# Patient Record
Sex: Female | Born: 1970 | Race: White | Hispanic: No | Smoking: Never smoker
Health system: Southern US, Community
[De-identification: ages and names within clinical notes are randomized; demographics above are authoritative.]

## PROBLEM LIST (undated history)

## (undated) DIAGNOSIS — IMO0001 Reserved for inherently not codable concepts without codable children: Secondary | ICD-10-CM

## (undated) DIAGNOSIS — M1710 Unilateral primary osteoarthritis, unspecified knee: Secondary | ICD-10-CM

## (undated) DIAGNOSIS — Z9289 Personal history of other medical treatment: Secondary | ICD-10-CM

## (undated) DIAGNOSIS — I10 Essential (primary) hypertension: Secondary | ICD-10-CM

## (undated) DIAGNOSIS — G43019 Migraine without aura, intractable, without status migrainosus: Principal | ICD-10-CM

## (undated) DIAGNOSIS — M1990 Unspecified osteoarthritis, unspecified site: Secondary | ICD-10-CM

## (undated) DIAGNOSIS — E669 Obesity, unspecified: Secondary | ICD-10-CM

## (undated) DIAGNOSIS — M179 Osteoarthritis of knee, unspecified: Secondary | ICD-10-CM

## (undated) HISTORY — DX: Migraine without aura, intractable, without status migrainosus: G43.019

## (undated) HISTORY — DX: Unilateral primary osteoarthritis, unspecified knee: M17.10

## (undated) HISTORY — PX: CARDIOVASCULAR STRESS TEST: SHX262

## (undated) HISTORY — DX: Unspecified osteoarthritis, unspecified site: M19.90

## (undated) HISTORY — DX: Essential (primary) hypertension: I10

## (undated) HISTORY — DX: Osteoarthritis of knee, unspecified: M17.9

## (undated) HISTORY — PX: CARPAL TUNNEL RELEASE: SHX101

## (undated) HISTORY — PX: EXPLORATION POST OPERATIVE OPEN HEART: SHX5061

## (undated) HISTORY — DX: Obesity, unspecified: E66.9

---
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
## 1997-05-18 ENCOUNTER — Inpatient Hospital Stay (HOSPITAL_COMMUNITY): Admission: AD | Admit: 1997-05-18 | Discharge: 1997-05-18 | Payer: Self-pay | Admitting: *Deleted

## 1997-05-21 ENCOUNTER — Inpatient Hospital Stay (HOSPITAL_COMMUNITY): Admission: AD | Admit: 1997-05-21 | Discharge: 1997-05-21 | Payer: Self-pay | Admitting: *Deleted

## 1997-07-17 ENCOUNTER — Ambulatory Visit (HOSPITAL_COMMUNITY): Admission: RE | Admit: 1997-07-17 | Discharge: 1997-07-17 | Payer: Self-pay | Admitting: Family Medicine

## 1997-07-23 ENCOUNTER — Ambulatory Visit (HOSPITAL_COMMUNITY): Admission: RE | Admit: 1997-07-23 | Discharge: 1997-07-23 | Payer: Self-pay | Admitting: Family Medicine

## 1997-10-03 ENCOUNTER — Ambulatory Visit (HOSPITAL_COMMUNITY): Admission: RE | Admit: 1997-10-03 | Discharge: 1997-10-03 | Payer: Self-pay | Admitting: *Deleted

## 1997-11-22 ENCOUNTER — Inpatient Hospital Stay (HOSPITAL_COMMUNITY): Admission: AD | Admit: 1997-11-22 | Discharge: 1997-11-22 | Payer: Self-pay | Admitting: *Deleted

## 1997-11-28 ENCOUNTER — Ambulatory Visit (HOSPITAL_COMMUNITY): Admission: RE | Admit: 1997-11-28 | Discharge: 1997-11-28 | Payer: Self-pay | Admitting: *Deleted

## 1998-01-01 ENCOUNTER — Inpatient Hospital Stay (HOSPITAL_COMMUNITY): Admission: AD | Admit: 1998-01-01 | Discharge: 1998-01-01 | Payer: Self-pay | Admitting: *Deleted

## 1998-04-29 ENCOUNTER — Inpatient Hospital Stay (HOSPITAL_COMMUNITY): Admission: AD | Admit: 1998-04-29 | Discharge: 1998-04-29 | Payer: Self-pay | Admitting: Obstetrics & Gynecology

## 1998-05-01 ENCOUNTER — Inpatient Hospital Stay (HOSPITAL_COMMUNITY): Admission: AD | Admit: 1998-05-01 | Discharge: 1998-05-06 | Payer: Self-pay | Admitting: *Deleted

## 1998-05-08 ENCOUNTER — Inpatient Hospital Stay (HOSPITAL_COMMUNITY): Admission: AD | Admit: 1998-05-08 | Discharge: 1998-05-08 | Payer: Self-pay | Admitting: Obstetrics

## 1998-10-19 ENCOUNTER — Emergency Department (HOSPITAL_COMMUNITY): Admission: EM | Admit: 1998-10-19 | Discharge: 1998-10-19 | Payer: Self-pay | Admitting: Emergency Medicine

## 1998-10-19 ENCOUNTER — Encounter: Payer: Self-pay | Admitting: Emergency Medicine

## 1999-03-31 ENCOUNTER — Encounter: Admission: RE | Admit: 1999-03-31 | Discharge: 1999-06-29 | Payer: Self-pay | Admitting: Orthopedic Surgery

## 1999-11-30 ENCOUNTER — Emergency Department (HOSPITAL_COMMUNITY): Admission: EM | Admit: 1999-11-30 | Discharge: 1999-11-30 | Payer: Self-pay | Admitting: Emergency Medicine

## 1999-12-21 ENCOUNTER — Emergency Department (HOSPITAL_COMMUNITY): Admission: EM | Admit: 1999-12-21 | Discharge: 1999-12-21 | Payer: Self-pay | Admitting: Emergency Medicine

## 2000-09-15 ENCOUNTER — Encounter: Payer: Self-pay | Admitting: Emergency Medicine

## 2000-09-15 ENCOUNTER — Emergency Department (HOSPITAL_COMMUNITY): Admission: EM | Admit: 2000-09-15 | Discharge: 2000-09-15 | Payer: Self-pay | Admitting: Emergency Medicine

## 2000-10-20 ENCOUNTER — Emergency Department (HOSPITAL_COMMUNITY): Admission: EM | Admit: 2000-10-20 | Discharge: 2000-10-21 | Payer: Self-pay | Admitting: Emergency Medicine

## 2000-10-20 ENCOUNTER — Encounter: Payer: Self-pay | Admitting: Emergency Medicine

## 2001-01-04 ENCOUNTER — Encounter: Admission: RE | Admit: 2001-01-04 | Discharge: 2001-04-04 | Payer: Self-pay | Admitting: Orthopedic Surgery

## 2001-01-24 ENCOUNTER — Other Ambulatory Visit: Admission: RE | Admit: 2001-01-24 | Discharge: 2001-01-24 | Payer: Self-pay | Admitting: Family Medicine

## 2001-05-18 ENCOUNTER — Encounter: Payer: Self-pay | Admitting: Family Medicine

## 2001-05-18 ENCOUNTER — Ambulatory Visit (HOSPITAL_COMMUNITY): Admission: RE | Admit: 2001-05-18 | Discharge: 2001-05-18 | Payer: Self-pay | Admitting: Family Medicine

## 2002-04-15 ENCOUNTER — Emergency Department (HOSPITAL_COMMUNITY): Admission: EM | Admit: 2002-04-15 | Discharge: 2002-04-15 | Payer: Self-pay | Admitting: Emergency Medicine

## 2002-05-11 ENCOUNTER — Ambulatory Visit (HOSPITAL_COMMUNITY): Admission: RE | Admit: 2002-05-11 | Discharge: 2002-05-11 | Payer: Self-pay | Admitting: Obstetrics & Gynecology

## 2002-05-11 ENCOUNTER — Encounter: Payer: Self-pay | Admitting: Obstetrics & Gynecology

## 2002-07-17 ENCOUNTER — Ambulatory Visit (HOSPITAL_COMMUNITY): Admission: RE | Admit: 2002-07-17 | Discharge: 2002-07-17 | Payer: Self-pay | Admitting: Obstetrics & Gynecology

## 2002-07-17 ENCOUNTER — Encounter: Payer: Self-pay | Admitting: Obstetrics & Gynecology

## 2002-08-21 ENCOUNTER — Ambulatory Visit (HOSPITAL_COMMUNITY): Admission: AD | Admit: 2002-08-21 | Discharge: 2002-08-21 | Payer: Self-pay | Admitting: *Deleted

## 2002-08-21 ENCOUNTER — Encounter: Payer: Self-pay | Admitting: Obstetrics & Gynecology

## 2002-09-05 ENCOUNTER — Inpatient Hospital Stay (HOSPITAL_COMMUNITY): Admission: AD | Admit: 2002-09-05 | Discharge: 2002-09-05 | Payer: Self-pay | Admitting: Obstetrics & Gynecology

## 2002-09-05 ENCOUNTER — Encounter: Payer: Self-pay | Admitting: Obstetrics & Gynecology

## 2002-10-05 ENCOUNTER — Inpatient Hospital Stay (HOSPITAL_COMMUNITY): Admission: AD | Admit: 2002-10-05 | Discharge: 2002-10-05 | Payer: Self-pay | Admitting: Obstetrics & Gynecology

## 2002-11-02 ENCOUNTER — Inpatient Hospital Stay (HOSPITAL_COMMUNITY): Admission: AD | Admit: 2002-11-02 | Discharge: 2002-11-04 | Payer: Self-pay | Admitting: Obstetrics

## 2002-11-02 ENCOUNTER — Encounter: Payer: Self-pay | Admitting: Obstetrics

## 2002-11-23 ENCOUNTER — Inpatient Hospital Stay (HOSPITAL_COMMUNITY): Admission: AD | Admit: 2002-11-23 | Discharge: 2002-11-24 | Payer: Self-pay | Admitting: Obstetrics

## 2002-11-24 ENCOUNTER — Inpatient Hospital Stay (HOSPITAL_COMMUNITY): Admission: AD | Admit: 2002-11-24 | Discharge: 2002-11-24 | Payer: Self-pay | Admitting: Obstetrics & Gynecology

## 2002-12-06 ENCOUNTER — Inpatient Hospital Stay (HOSPITAL_COMMUNITY): Admission: AD | Admit: 2002-12-06 | Discharge: 2002-12-07 | Payer: Self-pay | Admitting: Obstetrics & Gynecology

## 2002-12-11 ENCOUNTER — Inpatient Hospital Stay (HOSPITAL_COMMUNITY): Admission: AD | Admit: 2002-12-11 | Discharge: 2002-12-13 | Payer: Self-pay | Admitting: Obstetrics & Gynecology

## 2003-01-31 ENCOUNTER — Encounter (INDEPENDENT_AMBULATORY_CARE_PROVIDER_SITE_OTHER): Payer: Self-pay | Admitting: *Deleted

## 2003-01-31 ENCOUNTER — Ambulatory Visit (HOSPITAL_COMMUNITY): Admission: RE | Admit: 2003-01-31 | Discharge: 2003-01-31 | Payer: Self-pay | Admitting: General Surgery

## 2003-02-25 ENCOUNTER — Emergency Department (HOSPITAL_COMMUNITY): Admission: EM | Admit: 2003-02-25 | Discharge: 2003-02-25 | Payer: Self-pay | Admitting: Emergency Medicine

## 2003-03-28 ENCOUNTER — Emergency Department (HOSPITAL_COMMUNITY): Admission: EM | Admit: 2003-03-28 | Discharge: 2003-03-28 | Payer: Self-pay | Admitting: Family Medicine

## 2003-11-12 ENCOUNTER — Emergency Department (HOSPITAL_COMMUNITY): Admission: EM | Admit: 2003-11-12 | Discharge: 2003-11-12 | Payer: Self-pay | Admitting: Emergency Medicine

## 2004-01-10 ENCOUNTER — Emergency Department (HOSPITAL_COMMUNITY): Admission: EM | Admit: 2004-01-10 | Discharge: 2004-01-10 | Payer: Self-pay | Admitting: Family Medicine

## 2004-02-15 ENCOUNTER — Emergency Department (HOSPITAL_COMMUNITY): Admission: EM | Admit: 2004-02-15 | Discharge: 2004-02-15 | Payer: Self-pay | Admitting: Family Medicine

## 2004-03-13 ENCOUNTER — Inpatient Hospital Stay (HOSPITAL_COMMUNITY): Admission: AD | Admit: 2004-03-13 | Discharge: 2004-03-13 | Payer: Self-pay | Admitting: Obstetrics

## 2004-07-22 ENCOUNTER — Emergency Department (HOSPITAL_COMMUNITY): Admission: EM | Admit: 2004-07-22 | Discharge: 2004-07-22 | Payer: Self-pay | Admitting: Family Medicine

## 2004-08-02 ENCOUNTER — Emergency Department (HOSPITAL_COMMUNITY): Admission: EM | Admit: 2004-08-02 | Discharge: 2004-08-02 | Payer: Self-pay | Admitting: Family Medicine

## 2004-09-29 ENCOUNTER — Emergency Department (HOSPITAL_COMMUNITY): Admission: EM | Admit: 2004-09-29 | Discharge: 2004-09-29 | Payer: Self-pay | Admitting: Family Medicine

## 2004-10-31 ENCOUNTER — Emergency Department (HOSPITAL_COMMUNITY): Admission: EM | Admit: 2004-10-31 | Discharge: 2004-10-31 | Payer: Self-pay | Admitting: Family Medicine

## 2004-12-12 ENCOUNTER — Emergency Department (HOSPITAL_COMMUNITY): Admission: EM | Admit: 2004-12-12 | Discharge: 2004-12-12 | Payer: Self-pay | Admitting: Family Medicine

## 2005-01-08 ENCOUNTER — Emergency Department (HOSPITAL_COMMUNITY): Admission: EM | Admit: 2005-01-08 | Discharge: 2005-01-08 | Payer: Self-pay | Admitting: Family Medicine

## 2005-02-06 ENCOUNTER — Emergency Department (HOSPITAL_COMMUNITY): Admission: EM | Admit: 2005-02-06 | Discharge: 2005-02-06 | Payer: Self-pay | Admitting: Emergency Medicine

## 2005-02-17 ENCOUNTER — Emergency Department (HOSPITAL_COMMUNITY): Admission: EM | Admit: 2005-02-17 | Discharge: 2005-02-17 | Payer: Self-pay | Admitting: Family Medicine

## 2005-05-11 ENCOUNTER — Emergency Department (HOSPITAL_COMMUNITY): Admission: EM | Admit: 2005-05-11 | Discharge: 2005-05-11 | Payer: Self-pay | Admitting: Emergency Medicine

## 2005-08-29 ENCOUNTER — Emergency Department (HOSPITAL_COMMUNITY): Admission: EM | Admit: 2005-08-29 | Discharge: 2005-08-30 | Payer: Self-pay | Admitting: Emergency Medicine

## 2005-09-09 ENCOUNTER — Emergency Department (HOSPITAL_COMMUNITY): Admission: EM | Admit: 2005-09-09 | Discharge: 2005-09-09 | Payer: Self-pay | Admitting: Family Medicine

## 2005-10-07 ENCOUNTER — Emergency Department (HOSPITAL_COMMUNITY): Admission: EM | Admit: 2005-10-07 | Discharge: 2005-10-07 | Payer: Self-pay | Admitting: Emergency Medicine

## 2006-02-13 ENCOUNTER — Emergency Department (HOSPITAL_COMMUNITY): Admission: EM | Admit: 2006-02-13 | Discharge: 2006-02-13 | Payer: Self-pay | Admitting: Emergency Medicine

## 2006-03-22 ENCOUNTER — Emergency Department (HOSPITAL_COMMUNITY): Admission: EM | Admit: 2006-03-22 | Discharge: 2006-03-22 | Payer: Self-pay | Admitting: Family Medicine

## 2006-03-25 ENCOUNTER — Emergency Department (HOSPITAL_COMMUNITY): Admission: EM | Admit: 2006-03-25 | Discharge: 2006-03-25 | Payer: Self-pay | Admitting: Family Medicine

## 2006-05-05 ENCOUNTER — Emergency Department (HOSPITAL_COMMUNITY): Admission: EM | Admit: 2006-05-05 | Discharge: 2006-05-05 | Payer: Self-pay | Admitting: Emergency Medicine

## 2006-09-24 ENCOUNTER — Ambulatory Visit (HOSPITAL_BASED_OUTPATIENT_CLINIC_OR_DEPARTMENT_OTHER): Admission: RE | Admit: 2006-09-24 | Discharge: 2006-09-24 | Payer: Self-pay | Admitting: Orthopedic Surgery

## 2006-11-10 ENCOUNTER — Emergency Department (HOSPITAL_COMMUNITY): Admission: EM | Admit: 2006-11-10 | Discharge: 2006-11-10 | Payer: Self-pay | Admitting: Family Medicine

## 2007-04-23 ENCOUNTER — Emergency Department (HOSPITAL_COMMUNITY): Admission: EM | Admit: 2007-04-23 | Discharge: 2007-04-23 | Payer: Self-pay | Admitting: Family Medicine

## 2007-06-18 ENCOUNTER — Emergency Department (HOSPITAL_COMMUNITY): Admission: EM | Admit: 2007-06-18 | Discharge: 2007-06-18 | Payer: Self-pay | Admitting: Emergency Medicine

## 2007-08-11 ENCOUNTER — Emergency Department (HOSPITAL_COMMUNITY): Admission: EM | Admit: 2007-08-11 | Discharge: 2007-08-11 | Payer: Self-pay | Admitting: Family Medicine

## 2007-08-28 ENCOUNTER — Emergency Department (HOSPITAL_COMMUNITY): Admission: EM | Admit: 2007-08-28 | Discharge: 2007-08-28 | Payer: Self-pay | Admitting: Family Medicine

## 2007-10-26 ENCOUNTER — Emergency Department (HOSPITAL_COMMUNITY): Admission: EM | Admit: 2007-10-26 | Discharge: 2007-10-26 | Payer: Self-pay | Admitting: Family Medicine

## 2007-12-03 ENCOUNTER — Emergency Department (HOSPITAL_COMMUNITY): Admission: EM | Admit: 2007-12-03 | Discharge: 2007-12-03 | Payer: Self-pay | Admitting: Family Medicine

## 2008-02-09 ENCOUNTER — Emergency Department (HOSPITAL_COMMUNITY): Admission: EM | Admit: 2008-02-09 | Discharge: 2008-02-09 | Payer: Self-pay | Admitting: Family Medicine

## 2008-05-23 ENCOUNTER — Emergency Department (HOSPITAL_COMMUNITY): Admission: EM | Admit: 2008-05-23 | Discharge: 2008-05-23 | Payer: Self-pay | Admitting: Emergency Medicine

## 2008-07-13 ENCOUNTER — Emergency Department (HOSPITAL_COMMUNITY): Admission: EM | Admit: 2008-07-13 | Discharge: 2008-07-13 | Payer: Self-pay | Admitting: Emergency Medicine

## 2008-11-01 ENCOUNTER — Emergency Department (HOSPITAL_COMMUNITY): Admission: EM | Admit: 2008-11-01 | Discharge: 2008-11-01 | Payer: Self-pay | Admitting: Family Medicine

## 2008-12-06 ENCOUNTER — Emergency Department (HOSPITAL_COMMUNITY): Admission: EM | Admit: 2008-12-06 | Discharge: 2008-12-06 | Payer: Self-pay | Admitting: Family Medicine

## 2009-03-17 ENCOUNTER — Emergency Department (HOSPITAL_COMMUNITY): Admission: EM | Admit: 2009-03-17 | Discharge: 2009-03-17 | Payer: Self-pay | Admitting: Family Medicine

## 2009-05-03 ENCOUNTER — Emergency Department (HOSPITAL_COMMUNITY): Admission: EM | Admit: 2009-05-03 | Discharge: 2009-05-04 | Payer: Self-pay | Admitting: Emergency Medicine

## 2009-05-13 ENCOUNTER — Ambulatory Visit: Payer: Self-pay | Admitting: Family Medicine

## 2009-06-11 ENCOUNTER — Emergency Department (HOSPITAL_COMMUNITY): Admission: EM | Admit: 2009-06-11 | Discharge: 2009-06-11 | Payer: Self-pay | Admitting: Family Medicine

## 2009-07-30 ENCOUNTER — Ambulatory Visit: Payer: Self-pay | Admitting: Internal Medicine

## 2009-08-22 ENCOUNTER — Other Ambulatory Visit: Admission: RE | Admit: 2009-08-22 | Discharge: 2009-08-22 | Payer: Self-pay | Admitting: Obstetrics & Gynecology

## 2009-08-22 ENCOUNTER — Encounter (INDEPENDENT_AMBULATORY_CARE_PROVIDER_SITE_OTHER): Payer: Self-pay | Admitting: *Deleted

## 2009-08-22 ENCOUNTER — Ambulatory Visit: Payer: Self-pay | Admitting: Obstetrics and Gynecology

## 2009-08-22 LAB — CONVERTED CEMR LAB: Prolactin: 8.8 ng/mL

## 2009-08-23 ENCOUNTER — Encounter: Payer: Self-pay | Admitting: Obstetrics and Gynecology

## 2009-08-23 LAB — CONVERTED CEMR LAB: Trich, Wet Prep: NONE SEEN

## 2009-09-05 ENCOUNTER — Ambulatory Visit: Payer: Self-pay | Admitting: Obstetrics and Gynecology

## 2009-09-24 ENCOUNTER — Ambulatory Visit: Payer: Self-pay | Admitting: Internal Medicine

## 2009-09-24 ENCOUNTER — Encounter (INDEPENDENT_AMBULATORY_CARE_PROVIDER_SITE_OTHER): Payer: Self-pay | Admitting: Family Medicine

## 2009-09-24 LAB — CONVERTED CEMR LAB
ALT: 16 units/L (ref 0–35)
Albumin: 4.5 g/dL (ref 3.5–5.2)
Calcium: 9.9 mg/dL (ref 8.4–10.5)
Chloride: 105 meq/L (ref 96–112)
Potassium: 4.2 meq/L (ref 3.5–5.3)
Total Bilirubin: 0.4 mg/dL (ref 0.3–1.2)
Total CHOL/HDL Ratio: 3.5
Triglycerides: 491 mg/dL — ABNORMAL HIGH (ref <150)

## 2009-09-30 ENCOUNTER — Ambulatory Visit: Payer: Self-pay | Admitting: Internal Medicine

## 2010-01-16 ENCOUNTER — Observation Stay (HOSPITAL_COMMUNITY): Admission: EM | Admit: 2010-01-16 | Discharge: 2009-09-30 | Payer: Self-pay | Admitting: Emergency Medicine

## 2010-02-09 DIAGNOSIS — IMO0001 Reserved for inherently not codable concepts without codable children: Secondary | ICD-10-CM

## 2010-02-09 DIAGNOSIS — Z9289 Personal history of other medical treatment: Secondary | ICD-10-CM

## 2010-02-09 HISTORY — DX: Personal history of other medical treatment: Z92.89

## 2010-02-09 HISTORY — PX: KNEE SURGERY: SHX244

## 2010-02-09 HISTORY — DX: Reserved for inherently not codable concepts without codable children: IMO0001

## 2010-03-03 ENCOUNTER — Ambulatory Visit: Admit: 2010-03-03 | Payer: Self-pay | Admitting: Obstetrics and Gynecology

## 2010-04-25 LAB — DIFFERENTIAL
Lymphocytes Relative: 37 % (ref 12–46)
Lymphs Abs: 2.9 10*3/uL (ref 0.7–4.0)

## 2010-04-25 LAB — POCT CARDIAC MARKERS
CKMB, poc: <1 ng/mL — ABNORMAL LOW (ref 1.0–8.0)
CKMB, poc: <1 ng/mL — ABNORMAL LOW (ref 1.0–8.0)
Myoglobin, poc: 37.9 ng/mL (ref 12–200)
Myoglobin, poc: 40.1 ng/mL (ref 12–200)
Myoglobin, poc: 44.7 ng/mL (ref 12–200)
Troponin i, poc: <0.05 ng/mL (ref 0.00–0.09)
Troponin i, poc: <0.05 ng/mL (ref 0.00–0.09)
Troponin i, poc: <0.05 ng/mL (ref 0.00–0.09)

## 2010-04-25 LAB — URINALYSIS, ROUTINE W REFLEX MICROSCOPIC
Glucose, UA: NEGATIVE mg/dL
Hgb urine dipstick: NEGATIVE
Nitrite: NEGATIVE
Protein, ur: 100 mg/dL — AB
Specific Gravity, Urine: 1.029 (ref 1.005–1.030)
Urobilinogen, UA: 0.2 mg/dL (ref 0.0–1.0)
pH: 6 (ref 5.0–8.0)

## 2010-04-25 LAB — URINE CULTURE: Colony Count: >=100000

## 2010-04-25 LAB — CK TOTAL AND CKMB (NOT AT ARMC)
CK, MB: 1.2 ng/mL (ref 0.3–4.0)
CK, MB: 1.4 ng/mL (ref 0.3–4.0)
Relative Index: INVALID (ref 0.0–2.5)
Total CK: 82 U/L (ref 7–177)

## 2010-04-25 LAB — CBC
MCH: 27.3 pg (ref 26.0–34.0)
Platelets: 200 10*3/uL (ref 150–400)
RDW: 13.8 % (ref 11.5–15.5)

## 2010-04-25 LAB — BASIC METABOLIC PANEL
BUN: 12 mg/dL (ref 6–23)
CO2: 22 mEq/L (ref 19–32)
GFR calc Af Amer: >60 mL/min (ref >60)
GFR calc non Af Amer: >60 mL/min (ref >60)
Potassium: 3.5 mEq/L (ref 3.5–5.1)
Sodium: 136 mEq/L (ref 135–145)

## 2010-04-25 LAB — URINE MICROSCOPIC-ADD ON

## 2010-04-25 LAB — TROPONIN I
Troponin I: 0.01 ng/mL (ref 0.00–0.06)
Troponin I: 0.01 ng/mL (ref 0.00–0.06)

## 2010-04-25 LAB — PROTIME-INR: Prothrombin Time: 12.5 seconds (ref 11.6–15.2)

## 2010-04-27 LAB — POCT PREGNANCY, URINE: Preg Test, Ur: NEGATIVE

## 2010-05-19 LAB — RAPID STREP SCREEN (MED CTR MEBANE ONLY): Streptococcus, Group A Screen (Direct): POSITIVE — AB

## 2010-05-21 LAB — CBC
HCT: 33.7 % — ABNORMAL LOW (ref 36.0–46.0)
Hemoglobin: 11.6 g/dL — ABNORMAL LOW (ref 12.0–15.0)
MCHC: 34.6 g/dL (ref 30.0–36.0)
MCV: 79.5 fL (ref 78.0–100.0)
RBC: 4.23 MIL/uL (ref 3.87–5.11)
RDW: 14.1 % (ref 11.5–15.5)

## 2010-05-21 LAB — COMPREHENSIVE METABOLIC PANEL
ALT: 15 U/L (ref 0–35)
BUN: 11 mg/dL (ref 6–23)
CO2: 21 mEq/L (ref 19–32)
Calcium: 9.1 mg/dL (ref 8.4–10.5)
GFR calc non Af Amer: >60 mL/min (ref >60)
Glucose, Bld: 138 mg/dL — ABNORMAL HIGH (ref 70–99)
Sodium: 138 mEq/L (ref 135–145)

## 2010-05-21 LAB — DIFFERENTIAL
Basophils Absolute: 0 10*3/uL (ref 0.0–0.1)
Eosinophils Relative: 1 % (ref 0–5)
Lymphs Abs: 1.9 10*3/uL (ref 0.7–4.0)
Monocytes Absolute: 0.4 10*3/uL (ref 0.1–1.0)
Monocytes Relative: 6 % (ref 3–12)
Neutro Abs: 4.1 10*3/uL (ref 1.7–7.7)
Neutrophils Relative %: 64 % (ref 43–77)

## 2010-05-21 LAB — POCT CARDIAC MARKERS
CKMB, poc: <1 ng/mL — ABNORMAL LOW (ref 1.0–8.0)
Troponin i, poc: <0.05 ng/mL (ref 0.00–0.09)

## 2010-06-24 NOTE — Op Note (Signed)
Pamela [name], Pamela [name]              ACCOUNT NO.:  [id number]   MEDICAL RECORD NO.:  [id number]          PATIENT TYPE:  AMB   LOCATION:  DSC                          FACILITY:  [facility]   PHYSICIAN:  Harvie Junior, M.D.   DATE OF BIRTH:  [birth year]   DATE OF PROCEDURE:  09/24/2006  DATE OF DISCHARGE:                               OPERATIVE REPORT   PREOPERATIVE DIAGNOSIS:  Lateral meniscal tear with lateral femoral  condylar defect.   POSTOPERATIVE DIAGNOSES:  1. Lateral meniscal tear with lateral femoral condylar defect.  2. Tight lateral retinaculum.   PRINCIPAL PROCEDURE:  1. Partial lateral meniscectomy with corresponding debridement in the      lateral femoral compartment.  2. Lateral retinacular release.   SURGEON:  Harvie Junior, M.D.   ASSISTANT:  Marshia Ly, P.A.   ANESTHESIA:  General.   BRIEF HISTORY:  Pamela [name] is a 40 year old female with a history of  having had some significant lateral compartment arthritis.  She has been  dealing with this for a long period of time but ultimately came in with  significant lateral joint line pain and difficulty dealing with that.  She was also having some patellofemoral type pain but predominantly  lateral joint line pain.  We talked about treatment options.  We  ultimately felt that arthroscopic intervention was going to be the most  appropriate course of action so she was brought to the operating room  for this procedure.   PROCEDURE:  The patient is brought to the operating room.  After  adequate anesthesia was obtained with general anesthetic, the patient  was placed up on the operating room table.  The right leg was prepped  and draped in the usual sterile fashion.  Following this, routine  arthroscopic examination of the knee revealed that the medial femoral  condyle was within normal limits, no significant chondral injury.  The  medial meniscus was within normal limits.  Attention turned to the ACL,  normal.   Attention was turned to the lateral side which, unfortunately,  had basically a 25 mm area of exposed bone on the lateral tibial plateau  and an even bigger area on the lateral femoral condyle.  We cleaned up  the edges of this lesion which hopefully is where her pain was coming  from and there was some loose and fragmenting cartilage on those areas.  There was multiple floating cartilage in the knee and we thoroughly  debrided the knee. We went up and looked at the knee cap.  It was  tracking lateral and there was tightness in the lateral retinaculum.  We  did a lateral retinacular release and our thought being that the only  hope we really had to help her was taking some pressure off of that  lateral patellar facet and so a lateral retinacular release was  performed from 3 fingerbreadths proximal to the patella down to the  joint line.  Once that was completed, attention was turned towards the  patellar tracking, which was certainly now midline and certainly the  cannula could be moved under the patella much  easier at this point.  Once this was completed, the final check was made in the lateral  compartment and  there was no loose or fragmented pieces of cartilage.  Thorough and  copious irrigation was undertaken at this point and a sterile  compressive dressing was applied and the patient was taken to the  recovery room, she was noted to be satisfactory condition.  Estimated  blood loss of this procedure was none.      Harvie Junior, M.D.  Electronically Signed     JLG/MEDQ  D:  09/24/2006  T:  09/24/2006  Job:  [id number]

## 2010-06-27 NOTE — Discharge Summary (Signed)
NAME:  Pamela [name], Pamela [name]                   ACCOUNT NO.:  [id number]   MEDICAL RECORD NO.:  [id number]                   PATIENT TYPE:  INP   LOCATION:  9115                                 FACILITY:  [facility]   PHYSICIAN:  Charles A. Clearance Coots, M.D.             DATE OF BIRTH:  1970-12-12   DATE OF ADMISSION:  12/06/2002  DATE OF DISCHARGE:  12/07/2002                                 DISCHARGE SUMMARY   ADMISSION DIAGNOSES:  1. Thirty-eight weeks gestation.  2. Severe headache.  3. Elevated blood pressures.   DISCHARGE DIAGNOSES:  1. Thirty-eight weeks gestation.  2. Headache and elevated blood pressures resolved.  The patient feeling much     improved with no headache and no evidence of preeclampsia.  3. Discharged home undelivered at [redacted] weeks gestation in good condition.   REASON FOR ADMISSION:  A 40 year old white female G 5, P 3 presented to the  office with severe headache not relieved with Tylenol or Fioricet. On  examination in the office, blood pressure was elevated.  The patient also  complained of blurred vision.  Therefore, sent to [facility] for  admission and rule out preeclampsia.   PAST MEDICAL HISTORY:   SURGERIES:  1. Heart surgery at age 40.  2. Cesarean section.  3. Two vaginal deliveries.   ILLNESSES:  1. Irritable bowel syndrome.  2. Constipation prone.  3. Migraines.  4. Obesity.   MEDICATIONS:  Prenatal vitamins, Zyrtec, Tylenol, Fioricet.   ALLERGIES:  No known drug allergies.   FAMILY HISTORY:  Significant for heart disease, hypertension, diabetes,  stroke.   SOCIAL HISTORY:  Denies tobacco, alcohol, or recreational drug use.   PHYSICAL EXAMINATION:  GENERAL:  Obese white female in no acute distress.  VITAL SIGNS:  Height 5 feet 3 inches, weight 222 pounds.  She is afebrile.  Blood pressure 160/100 and repeat blood pressure 168/97.  HEENT:  Normal.  LUNGS:  Clear to auscultation bilaterally.  HEART:  Regular rate and rhythm.  ABDOMEN:   Soft, gravid, nontender.  FETAL MONITORING:  External fetal monitoring revealed an irregular uterine  contractions, reactive tracing.   LABORATORY DATA:  Hemoglobin 10.4, hematocrit 30, white blood cell count  7000, platelets 184,000.  Urinalysis was significant for negative protein.  Comprehensive metabolic panel was significant for creatinine of 0.6, BUN of  9, uric acid of 4.5, SGOT of 16, SGPT of less than 19, LDH of 114.   HOSPITAL COURSE:  The patient was admitted and started on IV magnesium  sulfate.  She responded quite well to bed rest.  Labs indicated no evidence  of preeclampsia.  With continued IV hydration and bed rest headaches  resolved.  The patient did well overnight and on the following morning had  no headache.  Nonstress was reactive.  The patient was therefore discharged  home undelivered at [redacted] weeks gestation.  Headaches resolved.  No evidence of  preeclampsia.  Discharged home in good condition.  DISCHARGE DISPOSITION:  Medications:  Continue on prenatal vitamins, Tylenol  if needed.  Routine written instructions were given for obstetrical  discharge.  The patient is to follow up at the office in one week for  prenatal care.  Preeclampsia precautions were also given.                                               Charles A. Clearance Coots, M.D.    CAH/MEDQ  D:  12/07/2002  T:  12/07/2002  Job:  [id number]

## 2010-06-27 NOTE — Op Note (Signed)
NAME:  Pamela [name], Pamela [name]                   ACCOUNT NO.:  [id number]   MEDICAL RECORD NO.:  [id number]                   PATIENT TYPE:  AMB   LOCATION:  DAY                                  FACILITY:  [facility]   PHYSICIAN:  Leonie Man, M.D.                DATE OF BIRTH:  [birth year]   DATE OF PROCEDURE:  01/31/2003  DATE OF DISCHARGE:                                 OPERATIVE REPORT   PREOPERATIVE DIAGNOSIS:  Hemorrhoidal disease with internal and external  hemorrhoids.   POSTOPERATIVE DIAGNOSIS:  Hemorrhoidal disease with internal and external  hemorrhoids.   OPERATION/PROCEDURE:  Total hemorrhoidectomy by PPH.   SURGEON:  Leonie Man, M.D.   ASSISTANT:  Nurse.   ANESTHESIA:  General.  The patient was in jackknife position.   INDICATIONS:  The patient is a 40 year old lady with severe hemorrhoidal  disease and partial rectal mucosal prolapse who comes now to the operating  room for total hemorrhoidectomy.  She understands the risks and potential  benefits of surgery and she gives consent for same.   DESCRIPTION OF PROCEDURE:  Following the induction of satisfactory general  anesthesia, the patient is positioned in the jackknife position.  The  buttocks cheeks are spread and held with tapes and the perianal tissues are  prepped and draped to be included in the sterile operative field.  Perianal  injections with 0.25% Marcaine with epinephrine and Wydase are injected into  the perianal tissues.  Gradual dilatation of the anal sphincter was then  carried out, up to three fingerbreadths.  The operating anoscope was then  placed into the anus.  I placed a pursestring suture approximately 4.5-5 cm  above  the dentate line using a 2-0 Prolene suture.  After having placed the  suture, the vagina was checked and there was no inclusion of the  rectovaginal septum.  The pursestring suture was noted to be fully intact.  I then placed the PPH stapling device with the anvil of the  suture line and  gently closed the instrument down to the anal verge which was then 4 cm.  The stapler was then closed and held tightly for approximately 30 seconds in  order to provide adequate tamponade for hemostasis.  The stapler was then  fired and held for another 30 seconds in order to provide tamponade.  The  stapling device was then removed.  A circumferential area of anorectal  mucosa along with the portions of the hemorrhoidal plexus were removed and  this was forwarded for pathologic evaluation.  The suture line was then  thoroughly checked for hemostasis.  It was noted to be dry.  A Gelfoam roll  was placed up into the anus at the suture line for additional tamponade and  hemostasis.  Sponge, instruments and sharp counts were verified and the anus  was covered with gauze dressing.  The patient then removed from the  operating room to the recovery room in stable condition.  She tolerated the  procedure well.                                               Leonie Man, M.D.    PB/MEDQ  D:  01/31/2003  T:  01/31/2003  Job:  [id number]   cc:   Roseanna Rainbow, M.D.  [address]  [place]  [place] [address]  Fax: [phone]

## 2010-06-28 ENCOUNTER — Observation Stay (HOSPITAL_COMMUNITY)
Admission: EM | Admit: 2010-06-28 | Discharge: 2010-06-30 | Disposition: A | Discharge status: Home / Self Care | Payer: Medicaid Other | Attending: Internal Medicine | Admitting: Internal Medicine

## 2010-06-28 ENCOUNTER — Emergency Department (HOSPITAL_COMMUNITY): Payer: Medicaid Other

## 2010-06-28 ENCOUNTER — Inpatient Hospital Stay (INDEPENDENT_AMBULATORY_CARE_PROVIDER_SITE_OTHER)
Admission: RE | Admit: 2010-06-28 | Discharge: 2010-06-28 | Disposition: A | Discharge status: Home / Self Care | Payer: Self-pay | Source: Ambulatory Visit | Attending: Family Medicine | Admitting: Family Medicine

## 2010-06-28 DIAGNOSIS — R0602 Shortness of breath: Secondary | ICD-10-CM | POA: Insufficient documentation

## 2010-06-28 DIAGNOSIS — K219 Gastro-esophageal reflux disease without esophagitis: Secondary | ICD-10-CM | POA: Insufficient documentation

## 2010-06-28 DIAGNOSIS — R079 Chest pain, unspecified: Secondary | ICD-10-CM

## 2010-06-28 DIAGNOSIS — Z8249 Family history of ischemic heart disease and other diseases of the circulatory system: Secondary | ICD-10-CM | POA: Insufficient documentation

## 2010-06-28 DIAGNOSIS — R0789 Other chest pain: Principal | ICD-10-CM | POA: Insufficient documentation

## 2010-06-28 DIAGNOSIS — R42 Dizziness and giddiness: Secondary | ICD-10-CM

## 2010-06-28 DIAGNOSIS — R51 Headache: Secondary | ICD-10-CM

## 2010-06-28 LAB — BASIC METABOLIC PANEL
BUN: 15 mg/dL (ref 6–23)
Creatinine, Ser: 0.55 mg/dL (ref 0.4–1.2)
GFR calc non Af Amer: >60 mL/min (ref >60)
Glucose, Bld: 84 mg/dL (ref 70–99)

## 2010-06-28 LAB — DIFFERENTIAL
Basophils Absolute: 0 10*3/uL (ref 0.0–0.1)
Basophils Relative: 0 % (ref 0–1)
Eosinophils Absolute: 0.1 10*3/uL (ref 0.0–0.7)
Eosinophils Relative: 1 % (ref 0–5)
Lymphs Abs: 2.7 10*3/uL (ref 0.7–4.0)
Neutrophils Relative %: 56 % (ref 43–77)

## 2010-06-28 LAB — POCT CARDIAC MARKERS
CKMB, poc: <1 ng/mL — ABNORMAL LOW (ref 1.0–8.0)
Myoglobin, poc: 71.2 ng/mL (ref 12–200)
Troponin i, poc: <0.05 ng/mL (ref 0.00–0.09)

## 2010-06-28 LAB — CBC
Platelets: 187 10*3/uL (ref 150–400)
RBC: 4.46 MIL/uL (ref 3.87–5.11)
RDW: 13.7 % (ref 11.5–15.5)
WBC: 7.4 10*3/uL (ref 4.0–10.5)

## 2010-06-28 LAB — PREGNANCY, URINE: Preg Test, Ur: NEGATIVE

## 2010-06-29 DIAGNOSIS — R079 Chest pain, unspecified: Secondary | ICD-10-CM

## 2010-06-29 LAB — PRO B NATRIURETIC PEPTIDE: Pro B Natriuretic peptide (BNP): 27.5 pg/mL (ref 0–125)

## 2010-06-29 LAB — CBC
HCT: 34.6 % — ABNORMAL LOW (ref 36.0–46.0)
MCH: 26.7 pg (ref 26.0–34.0)
MCV: 81.6 fL (ref 78.0–100.0)
Platelets: 188 10*3/uL (ref 150–400)
RDW: 13.9 % (ref 11.5–15.5)

## 2010-06-29 LAB — CARDIAC PANEL(CRET KIN+CKTOT+MB+TROPI)
Relative Index: 1.2 (ref 0.0–2.5)
Relative Index: 1.2 (ref 0.0–2.5)
Troponin I: <0.3 ng/mL (ref <0.30)

## 2010-06-29 LAB — LIPID PANEL
HDL: 46 mg/dL (ref >39)
LDL Cholesterol: 87 mg/dL (ref 0–99)
Total CHOL/HDL Ratio: 3.2 RATIO
Triglycerides: 74 mg/dL (ref <150)

## 2010-06-29 LAB — BASIC METABOLIC PANEL
BUN: 13 mg/dL (ref 6–23)
Chloride: 107 mEq/L (ref 96–112)
Glucose, Bld: 140 mg/dL — ABNORMAL HIGH (ref 70–99)
Potassium: 4.4 mEq/L (ref 3.5–5.1)
Sodium: 140 mEq/L (ref 135–145)

## 2010-06-29 LAB — PHOSPHORUS: Phosphorus: 3.1 mg/dL (ref 2.3–4.6)

## 2010-06-29 LAB — RAPID URINE DRUG SCREEN, HOSP PERFORMED
Barbiturates: NOT DETECTED
Benzodiazepines: NOT DETECTED
Cocaine: NOT DETECTED

## 2010-06-29 LAB — MRSA PCR SCREENING: MRSA by PCR: NEGATIVE

## 2010-06-29 LAB — CK TOTAL AND CKMB (NOT AT ARMC): Relative Index: 1.2 (ref 0.0–2.5)

## 2010-06-30 ENCOUNTER — Inpatient Hospital Stay (HOSPITAL_COMMUNITY): Payer: Medicaid Other

## 2010-06-30 DIAGNOSIS — R072 Precordial pain: Secondary | ICD-10-CM

## 2010-06-30 LAB — D-DIMER, QUANTITATIVE: D-Dimer, Quant: 0.32 ug/mL-FEU (ref 0.00–0.48)

## 2010-06-30 MED ORDER — TECHNETIUM TC 99M TETROFOSMIN IV KIT
30.0000 | PACK | Freq: Once | INTRAVENOUS | Status: AC | PRN
  Administered 2010-06-30: 30 via INTRAVENOUS

## 2010-06-30 MED ORDER — TECHNETIUM TC 99M TETROFOSMIN IV KIT
10.0000 | PACK | Freq: Once | INTRAVENOUS | Status: AC | PRN
  Administered 2010-06-30: 10 via INTRAVENOUS

## 2010-07-01 ENCOUNTER — Other Ambulatory Visit (HOSPITAL_COMMUNITY): Payer: Self-pay

## 2010-07-03 ENCOUNTER — Telehealth: Payer: Self-pay | Admitting: Internal Medicine

## 2010-07-03 NOTE — Telephone Encounter (Signed)
Pt returning your call. Pt states someone called her re heart monitor.

## 2010-07-04 NOTE — Consult Note (Signed)
NAMEGIANELLE, [name] NO.:  [id number]  MEDICAL RECORD NO.:  [id number]           PATIENT TYPE:  I  LOCATION:  3701                         FACILITY:  [facility]  PHYSICIAN:  Hillis Range, MD       DATE OF BIRTH:  [birth year]  DATE OF CONSULTATION: DATE OF DISCHARGE:                                CONSULTATION   REQUESTING PHYSICIAN:  Brendia Sacks, MD  REASON FOR CONSULTATION:  Chest pain.  HISTORY OF PRESENT ILLNESS:  [name] is a pleasant 40 year old female with no prior cardiac history who presents for further evaluation and management of chest pain.  The patient reports that she went to Urgent Care for evaluation of a headache.  While there, she developed sharp, fleeting left-sided chest pain, which she describes is 6/10 with radiation into her left arm.  She reports that this pain occurred intermittently since that time.  She reports having a "dull ache" sensation along her left chest as well.  She states that she has had this pain intermittently for "a long time."  She finds that episodes typically will occur when stressed or at rest.  She denies any episodes of chest pain with exertion.  Recently, she has tried to lose weight and has been walking on a treadmill frequently without any ischemic symptoms.  She denies associated palpitations, shortness of breath, nausea, vomiting, dizziness, presyncope, or syncope.  She was brought to [facility] for further evaluation.  Presently, she is resting comfortably and is without complaint.  She reports having a stress test previously, but states that due to chronic pain within her right knee, she was unable to reach target and the study was inconclusive at that time.  I presently do not have access to this study.  PAST MEDICAL HISTORY: 1. Degenerative joint disease. 2. Migraines. 3. Obesity. 4. General herpes simplex.  MEDICATIONS:  Reviewed in the [facility].  ALLERGIES:  No known drug allergies.  SOCIAL  HISTORY:  The patient is married and lives with her spouse in [place].  She denies tobacco, alcohol, or drug use.  FAMILY HISTORY:  Her mother had coronary artery disease at age 85.  She has no other family history of early onset coronary artery disease.  REVIEW OF SYSTEMS:  All systems were reviewed and negative except as outlined in the HPI above.  PHYSICAL EXAMINATION:  Telemetry reveals sinus rhythm at 50 beats per minute. VITAL SIGNS:  Blood pressure 114/54, heart rate 56, respirations 17, sats 95% on room air. GENERAL:  The patient is an obese female in no acute distress.  She is alert and oriented x3. HEENT:  Normocephalic, atraumatic.  Sclerae clear.  Conjunctivae pink. Oropharynx clear. NECK:  Supple.  No thyromegaly, JVD or bruits. LUNGS:  Clear to auscultation bilaterally. HEART:  Regular rate and rhythm.  No murmurs, rubs, or gallops. GI:  Soft, nontender, and nondistended.  Positive bowel sounds. EXTREMITIES:  No clubbing, cyanosis, or edema. SKIN:  No ecchymoses or lacerations. MUSCULOSKELETAL:  No deformity or atrophy. PSYCHIATRY:  Euthymic mood.  Full affect.  EKG reveals sinus rhythm at 56 beats per minute with a PR  interval of 146 milliseconds.  There are no ischemic findings, but nonspecific ST/T- wave flattening as observed.  LABORATORY DATA:  TSH 0.79, total cholesterol 148, HDL 46, LDL 87. Cardiac markers are negative.  Potassium 4.4, creatinine 0.55.  Chest x-ray reveals no acute airspace disease.  IMPRESSION:  [name] is a pleasant 40 year old female with a history of obesity who now presents with atypical chest pain.  She has a prior history of atypical chest pain for which she had a prior stress test, but states that the test was indeterminate due to an inability to reach the target heart rate due to arthritis.  I think that the most prudent strategy at this time would be to perform a Lexiscan Myoview.  If this is low risk, then I would  recommend medical therapy long term and I think that she could be safely discharged.  If the study is high risk, then further cardiac evaluation will be necessary.  I think that it would be quite prudent to avoid phentermine and stimulants long term which may be contributing to her chest pain.  I have placed the patient on aspirin at this time.  We will follow the patient with you during her hospital stay.     Hillis Range, MD     JA/MEDQ  D:  06/29/2010  T:  06/30/2010  Job:  [id number]  cc:   Brendia Sacks, MD  Electronically Signed by Hillis Range MD on 07/04/2010 05:55:09 PM

## 2010-07-07 NOTE — Discharge Summary (Signed)
NAMEDOROTEA, HAND NO.:  [id number]  MEDICAL RECORD NO.:  [id number]           PATIENT TYPE:  I  LOCATION:  3701                         FACILITY:  [facility]  PHYSICIAN:  Brendia Sacks, MD    DATE OF BIRTH:  [birth year]  DATE OF ADMISSION:  06/28/2010 DATE OF DISCHARGE:  06/30/2010                              DISCHARGE SUMMARY   PRIMARY CARE PHYSICIAN:  The patient will be establishing with [facility].  PRIMARY CARDIOLOGIST:  Pricilla Riffle, MD, [facility], at [facility].  CONDITION ON DISCHARGE:  Improved.  DISCHARGE DIAGNOSES: 1. Noncardiac chest pain.  The patient was admitted and ruled out with     serial cardiac enzymes.  She has had this pain intermittently over     the last 2 years.  It seems to be associated with stress.  Also     associated with movement.  There is no shortness of breath or     pleuritic component.  The patient did rule out with serial cardiac     enzymes.  She had had an incomplete treadmill stress in the past.     Given her persistent symptoms and family history of early coronary     artery disease, further evaluation with cardiology consultation was     obtained.  Nuclear stress test was recommended.  This was performed     today and was negative for ischemia.  Of note, during the nuclear     stress test, the patient's baseline heart rate of 40s-50s quickly     increased to 110 with some EKG changes in II, III, and aVF.  One     EKG captured a different P-wave morphology and there was question     of ectopic atrial rhythm.  She had varying heart rates 50-80s     poststress test.  The EKG was discussed by Ms. Dunn with Dr. Tenny Craw     and Dr. Tenny Craw recommended a 48-hour Holter monitor and follow up     with Dr. Tenny Craw in the outpatient setting.  I have discussed the case     with Ms. Shea Evans.  Followup will be arranged through [facility] with Dr.     Tenny Craw as well as Holter.  [facility] will call the patient to arrange     Holter.  Per Ms. Dunn, the  patient is stable for discharge as long     as D-dimer is negative.  D-dimer was recommended as the patient has     mild pulmonary hypertension to exclude embolism.  Of note, the     patient has no pleuritic component, no shortness of breath with     this and this has been ongoing for approximately 2 years, so the     likelihood of venous thromboembolism is very, very well.  The     patient will be discharged home today if D-dimer is negative. 2. On phentermine for weight loss.  I have recommended she discontinue     this medication.  She has been on this medication for 2 months.  I     have asked her to call her physician  in the morning to discuss     tapering this.  Sometimes, this medication must be tapered.  We     will defer to the clinical expertise of the prescribing physician     in this matter.  The patient agrees that she should discontinue     this medication in the long run.  CONSULTATIONS:  Cardiology recommendations as above.  PROCEDURES:  Nuclear medicine study of the heart stenting.  No evidence of myocardial ischemia or infarction.  Normal left ventricular wall motion.  Estimated ejection fraction of 59%.  IMAGING:  Chest x-ray on Jun 28, 2010:  No evidence of acute cardiopulmonary disease.  ANCILLARY STUDIES:  Two-D echocardiogram on Jun 30, 2010:  Left ventricular ejection fraction 55-60%.  Left atrium was mildly dilated. No defect and patent foramen ovale was identified.  Pressure of the pulmonary artery was mild increased with peak pressure of 39.  PERTINENT LABORATORY STUDIES: 1. Urine pregnancy was negative. 2. Urine drug screen was positive for opiates. 3. Pro-BNP was within normal limits. 4. TSH was within normal limits and lipid profile was within normal     limits. 5. CBC was unremarkable. 6. Basic metabolic panel was unremarkable on admission. 7. Cardiac enzymes were negative. 8. TSH within normal limits.  DISCHARGE EXAMINATION:  GENERAL:  The  patient is feeling well.  She has intermittent pain, but overall is better. VITAL SIGNS:  Temperature is 98.6, pulse 43, respirations 20, blood pressure 127/79, and sat 98% on room air.  CARDIOVASCULAR:  Regular rate and rhythm.  No murmur, rub, or gallop. RESPIRATORY:  Clear to auscultation bilaterally.  No wheezes, rales, or rhonchi.  Normal respiratory effort.  DISCHARGE INSTRUCTIONS:  The patient will be discharged home today. Diet is a regular diet.  Activity as tolerated.  She continues with a weight loss regimen.  She is approximately lost 30 pounds.  DISCHARGE MEDICATIONS: 1. Aspirin 81 mg p.o. daily. 2. Allergy eye drops 1-2 tablets by mouth b.i.d. as needed for eye     allergy symptoms. 3. Chewable fiber tablet 2 tablets p.o. daily. 4. Ibuprofen 200 mg 4 tablets twice daily as needed for headache,     pain, or swelling in knee. 5. Multivitamin p.o. daily.  Discontinue phentermine.  Call your physician tomorrow, Jul 01, 2010, to discuss tapering or immediate discontinuation of this medication.  For now, the patient will stay on low-dose aspirin.  If significant arrhythmia is ruled out, we would defer long-term continuation of aspirin to Dr. Tenny Craw.  TIME COORDINATING DISCHARGE:  20 minutes.     Brendia Sacks, MD     DG/MEDQ  D:  06/30/2010  T:  07/01/2010  Job:  [id number]  cc:   [facility] Pricilla Riffle, MD, [facility]  Electronically Signed by Brendia Sacks  on 07/07/2010 04:37:54 PM

## 2010-07-10 NOTE — H&P (Signed)
Pamela [name], Pamela [name]              ACCOUNT NO.:  [id number]  MEDICAL RECORD NO.:  [id number]           PATIENT TYPE:  LOCATION:                                 FACILITY:  PHYSICIAN:  Lonia Blood, M.D.      DATE OF BIRTH:  10/26/1970  DATE OF ADMISSION:  06/29/2010 DATE OF DISCHARGE:                             HISTORY & PHYSICAL   PRIMARY CARE PHYSICIAN:  She goes to [facility].  PRESENTING COMPLAINT:  Chest pain.  HISTORY OF PRESENT ILLNESS:  The patient is a 40 year old female with history of recurrent chest pain and family history of early coronary artery disease, who is here today with escalating chest pain over the last 2 days.  [facility].  While she was there, she developed the chest pain.  She has had migraine headaches for a while.  She described the chest pain as left-sided, rated as 6-7/10, radiating sometime to her left arm, also occasionally to her left jaw.  She did not take any medication and does not want to take any nitro because it worsens her headache.  There was no associated cough, shortness of breath, nausea, vomiting, or diaphoresis.  Risk factors for coronary artery disease are morbid obesity and family history of early coronary artery disease.  PAST MEDICAL HISTORY:  Significant for atypical chest pains.  The patient apparently had a stress test last year.  Per patient, her stress test was inconclusive.  We do not have any report here to confirm or refuse the patient's claim.  The patient also had history of atrial septal defect as a child where she had surgical repairs.  History of genital herpes simplex, migraine headaches, streptococcal pharyngitis, and dysmenorrhea.  ALLERGIES:  She has no known drug allergies.  HOME MEDICATIONS: 1. Excedrin migraine. 2. Phentermine 37.5 mg daily.  SOCIAL HISTORY:  The patient is married with 2 young children.  Lives with her husband in [place].  Denied tobacco,  alcohol, or IV drug use.  FAMILY HISTORY:  Her mother had coronary artery disease at the age of 20.  There is also history of diabetes, stroke, and cancer in the family.  REVIEW OF SYSTEMS:  All systems reviewed are currently negative except per HPI.  PHYSICAL EXAMINATION:  VITAL SIGNS:  Temperature 98.6, blood pressure 140/73, pulse 59, respiratory rate 20, and sats 100% on room air. GENERAL:  She is awake, alert, and oriented.  She is in no acute distress. HEENT:  PERRL.  EOMI.  No pallor.  No jaundice.  No rhinorrhea. NECK:  Supple.  No JVD.  No lymphadenopathy. RESPIRATORY:  She has good air entry bilaterally.  No wheezes.  No rales.  No crackles. CARDIOVASCULAR:  S1 and S2.  No murmur. ABDOMEN:  Obese, soft, and nontender with positive bowel sounds. EXTREMITIES:  No edema, cyanosis, or clubbing. SKIN:  No rashes.  No ulcers.  LABORATORY DATA:  White count is 7.4, hemoglobin 12.2, and platelet count of 187.  Sodium 139, potassium 3.9, chloride 104, CO2 23, glucose 84, BUN 15, creatinine 0.55.  Her chest x-ray showed no evidence of acute  disease.  EKG showed normal sinus rhythm, no significant ST-T wave changes.  ASSESSMENT:  This is a 40 year old female who has low to medium risk factors for coronary artery disease including family history and hypertension with unknown cholesterol status.  The patient presenting with typical left-sided chest pain.  PLAN: 1. Chest pain.  We will admit her for observation.  Check serial     cardiac enzymes.  The patient has not had any echo in couple of     years.  We will check 2-D echo as well.  Check fasting lipid panel     and urine drug screen.  The patient does not want to be on any     nitro at this point secondary to her headaches.  We will therefore     keep her on morphine.  I will consider Lovenox, especially the     patient's enzymes elevated, also new EKG changes started.  Either     way, the patient may benefit from some  stress testing after this     hospitalization.  History of atrial septal defect.  Again, we will     check 2-D echo, although she had this repaired as a child, so she     will not have done it now. 2. Morbid obesity.  The patient has been counseled effectively. 3. GERD.  The patient denied having any GERD like symptoms or     hypertension, but her blood pressure is elevated here more than     likely from the use of phentermine.  I will give up empiric     Protonix while in the hospital.  Further treatment will depend on     outpatient responds and we will decide whether to call Cardiology     in.     Lonia Blood, M.D.     Verlin Grills  D:  06/29/2010  T:  06/29/2010  Job:  [id number]  Electronically Signed by Lonia Blood M.D. on 07/10/2010 11:48:28 AM

## 2010-07-15 ENCOUNTER — Encounter (INDEPENDENT_AMBULATORY_CARE_PROVIDER_SITE_OTHER): Payer: Self-pay

## 2010-07-15 DIAGNOSIS — R002 Palpitations: Secondary | ICD-10-CM

## 2010-07-28 ENCOUNTER — Telehealth: Payer: Self-pay | Admitting: *Deleted

## 2010-07-28 NOTE — Telephone Encounter (Signed)
LMOM to call back concerning results of Holter monitor from 07/15/2010.

## 2010-07-29 NOTE — Telephone Encounter (Signed)
Pt returning call back from yesterday- holter monitor

## 2010-07-31 NOTE — Telephone Encounter (Signed)
Called patient with results of Holter monitor.

## 2010-08-01 ENCOUNTER — Telehealth: Payer: Self-pay | Admitting: *Deleted

## 2010-08-01 ENCOUNTER — Encounter: Payer: Self-pay | Admitting: Internal Medicine

## 2010-08-01 NOTE — Telephone Encounter (Signed)
Called patient back to advise her that she does not need to keep appointment with Dr. Tenny Craw per conversation with PR. Appointment cancelled for 08/18/2010.

## 2010-08-15 ENCOUNTER — Other Ambulatory Visit (HOSPITAL_COMMUNITY): Payer: Self-pay | Admitting: Family Medicine

## 2010-08-15 ENCOUNTER — Encounter: Payer: Self-pay | Admitting: Internal Medicine

## 2010-08-15 DIAGNOSIS — M25561 Pain in right knee: Secondary | ICD-10-CM

## 2010-08-18 ENCOUNTER — Encounter: Payer: Self-pay | Admitting: Internal Medicine

## 2010-08-19 ENCOUNTER — Encounter: Payer: Self-pay | Admitting: Internal Medicine

## 2010-08-20 ENCOUNTER — Other Ambulatory Visit (HOSPITAL_COMMUNITY): Payer: Self-pay | Admitting: Family Medicine

## 2010-08-20 ENCOUNTER — Ambulatory Visit (HOSPITAL_COMMUNITY)
Admission: RE | Admit: 2010-08-20 | Discharge: 2010-08-20 | Disposition: A | Discharge status: Home / Self Care | Payer: Medicaid Other | Source: Ambulatory Visit | Attending: Family Medicine | Admitting: Family Medicine

## 2010-08-20 DIAGNOSIS — M25561 Pain in right knee: Secondary | ICD-10-CM

## 2010-08-20 DIAGNOSIS — M7120 Synovial cyst of popliteal space [Baker], unspecified knee: Secondary | ICD-10-CM | POA: Insufficient documentation

## 2010-08-20 DIAGNOSIS — M25469 Effusion, unspecified knee: Secondary | ICD-10-CM | POA: Insufficient documentation

## 2010-08-20 DIAGNOSIS — M899 Disorder of bone, unspecified: Secondary | ICD-10-CM | POA: Insufficient documentation

## 2010-08-20 DIAGNOSIS — M949 Disorder of cartilage, unspecified: Secondary | ICD-10-CM | POA: Insufficient documentation

## 2010-08-20 DIAGNOSIS — M25569 Pain in unspecified knee: Secondary | ICD-10-CM | POA: Insufficient documentation

## 2010-10-17 ENCOUNTER — Encounter (HOSPITAL_COMMUNITY)
Admission: RE | Admit: 2010-10-17 | Discharge: 2010-10-17 | Disposition: A | Discharge status: Home / Self Care | Payer: Medicaid Other | Source: Ambulatory Visit | Attending: Orthopedic Surgery | Admitting: Orthopedic Surgery

## 2010-10-17 ENCOUNTER — Other Ambulatory Visit (HOSPITAL_COMMUNITY): Payer: Medicaid Other

## 2010-10-17 LAB — HCG, SERUM, QUALITATIVE: Preg, Serum: NEGATIVE

## 2010-10-17 LAB — DIFFERENTIAL
Basophils Absolute: 0 10*3/uL (ref 0.0–0.1)
Eosinophils Relative: 1 % (ref 0–5)
Lymphocytes Relative: 37 % (ref 12–46)
Lymphs Abs: 2.5 10*3/uL (ref 0.7–4.0)
Neutro Abs: 3.7 10*3/uL (ref 1.7–7.7)

## 2010-10-17 LAB — URINALYSIS, ROUTINE W REFLEX MICROSCOPIC
Bilirubin Urine: NEGATIVE
Glucose, UA: NEGATIVE mg/dL
Hgb urine dipstick: NEGATIVE
Specific Gravity, Urine: 1.024 (ref 1.005–1.030)
pH: 6 (ref 5.0–8.0)

## 2010-10-17 LAB — CBC
HCT: 36.5 % (ref 36.0–46.0)
Hemoglobin: 12.7 g/dL (ref 12.0–15.0)
MCV: 80 fL (ref 78.0–100.0)
RBC: 4.56 MIL/uL (ref 3.87–5.11)
RDW: 12.9 % (ref 11.5–15.5)
WBC: 6.9 10*3/uL (ref 4.0–10.5)

## 2010-10-17 LAB — PROTIME-INR
INR: 0.97 (ref 0.00–1.49)
Prothrombin Time: 13.1 seconds (ref 11.6–15.2)

## 2010-10-17 LAB — BASIC METABOLIC PANEL
Calcium: 10.2 mg/dL (ref 8.4–10.5)
GFR calc Af Amer: >60 mL/min (ref >60)
GFR calc non Af Amer: >60 mL/min (ref >60)
Glucose, Bld: 85 mg/dL (ref 70–99)
Potassium: 4.3 mEq/L (ref 3.5–5.1)
Sodium: 142 mEq/L (ref 135–145)

## 2010-10-17 LAB — SURGICAL PCR SCREEN: Staphylococcus aureus: POSITIVE — AB

## 2010-10-17 LAB — APTT: aPTT: 29 seconds (ref 24–37)

## 2010-10-27 ENCOUNTER — Inpatient Hospital Stay (HOSPITAL_COMMUNITY)
Admission: RE | Admit: 2010-10-27 | Discharge: 2010-10-30 | DRG: 470 | Disposition: A | Discharge status: Home Health | Payer: Medicaid Other | Source: Ambulatory Visit | Attending: Orthopedic Surgery | Admitting: Orthopedic Surgery

## 2010-10-27 DIAGNOSIS — Z8249 Family history of ischemic heart disease and other diseases of the circulatory system: Secondary | ICD-10-CM

## 2010-10-27 DIAGNOSIS — Z833 Family history of diabetes mellitus: Secondary | ICD-10-CM

## 2010-10-27 DIAGNOSIS — M1710 Unilateral primary osteoarthritis, unspecified knee: Principal | ICD-10-CM | POA: Diagnosis present

## 2010-10-27 LAB — TYPE AND SCREEN: Antibody Screen: NEGATIVE

## 2010-10-28 LAB — PROTIME-INR: Prothrombin Time: 13.2 seconds (ref 11.6–15.2)

## 2010-10-28 LAB — BASIC METABOLIC PANEL
BUN: 10 mg/dL (ref 6–23)
Chloride: 100 mEq/L (ref 96–112)
Creatinine, Ser: 0.49 mg/dL — ABNORMAL LOW (ref 0.50–1.10)
GFR calc Af Amer: >60 mL/min (ref >60)
Glucose, Bld: 143 mg/dL — ABNORMAL HIGH (ref 70–99)

## 2010-10-28 LAB — CBC
Hemoglobin: 10.1 g/dL — ABNORMAL LOW (ref 12.0–15.0)
MCH: 27.3 pg (ref 26.0–34.0)
MCHC: 34 g/dL (ref 30.0–36.0)
MCV: 80.3 fL (ref 78.0–100.0)
Platelets: 175 10*3/uL (ref 150–400)

## 2010-10-28 NOTE — Op Note (Signed)
NAMESHIRLE, PROVENCAL NO.:  [id number]  MEDICAL RECORD NO.:  [id number]  LOCATION:  5039                         FACILITY:  [facility]  PHYSICIAN:  Feliberto Gottron. Turner Daniels, M.D.   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  PROCEDURE:  Right total knee arthroplasty using DePuy Sigma RP components, 2.5 right femur, 3 tibia, 10-mm sigma RP bearing, 32-mm patellar button.  All components were cemented, double batch DePuy HV cement with 1500 mg of Zinacef.  SURGEON:  Feliberto Gottron. Turner Daniels, MD  FIRST ASSISTANT:  Shirl Harris PA-C  ANESTHETIC:  General endotracheal.  ESTIMATED BLOOD LOSS:  Minimal.  FLUID REPLACEMENT:  1500 mL of crystalloid.  DRAINS PLACED:  Foley catheter and two medium Hemovacs.  URINE OUTPUT:  300 mL.  TOURNIQUET TIME:  1 hour and 20 minutes.  INDICATIONS FOR PROCEDURE:  A 40 year old woman with end-stage arthritis of the right knee, bone-on-bone to the lateral compartment on the standing and [place] views.  She has failed conservative measures with anti-inflammatory medicines, physical therapy, attempts of weight loss, cortisone injections and Viscoat supplementation over the last year. Despite all of these intervention, she has continued unremitting pain and desires elective right total knee arthroplasty.  Risks and benefits of surgery have been discussed, questions answered.  DESCRIPTION OF PROCEDURE:  The patient was identified by armband and taken to the Holding Area at [facility] where she received preoperative IV antibiotic.  She also had right femoral nerve block and was taken to operating room #1.  Appropriate anesthetic monitors were attached.  General endotracheal anesthesia was induced  with the patient in supine position.  Tourniquet applied high to the right thigh, lateral post and foot positioner applied to the table.  Right lower extremity was prepped and draped in sterile fashion from the ankle to the midthigh.  Time-out procedure performed.  Limb wrapped with an Esmarch bandage, knee bent, tourniquet was inflated to 350 mmHg and began the operation on making the anterior midline incision starting at handbreadth above the patella over the midline of the patella and 1 cm medial to and 4 cm distal to the tibial tubercle.  Small bleeders in the skin and subcutaneous tissue identified and cauterized.  Transverse retinaculum was identified and reflected medially allowing medial parapatellar arthrotomy.  The patella was everted, prepatellar fat pad was resected.  Superficial medial collateral ligament was elevated from anterior-posterior off the proximal tibia leaving intact distally.  This allowed [place] to evert the patella, hyperflexed and externally rotate the knee.  The cruciate ligament was then recessed with electrocautery as were the anterior one half of the menisci.  The proximal tibia was entered in line with the axial shaft of the tibia using the DePuy step drill followed by the intramedullary guide rod set at 2 degrees posterior slope because there was little of any erosive change at the proximal tibia fairly even cut, 2 degrees posterior slope was accomplished removing about 7-8 mm of bone  medially and laterally using the cutting guide.  The posterior structures were protected with a McCulloch retractor through the notch, a posterior medial Z retractor and a lateral Hohmann retractor.  At this point, we entered the distal femur 2 mm anterior to the PCL origin with step drill followed by the intramedullary guide rod set at 5 degrees right with an 11-mm distal femoral cut.  This was pinned along the epicondylar axis and the distal femoral cut accomplished.  A full  11-mm removed was medially, laterally because of the femoral erosion probably about 6 or 7 mm.  We then sized for a 2.5 femur used in the posterior referencing sizing guide and pinned it 0 degrees of external rotation.  The chamfer cutting guide was screwed into place.  The anterior, posterior and chamfer cuts were accomplished without difficulty followed by the Sigma RP box cut.  The knee was then brought into full extension to confirm that was in good gap for 10-mm bearing in flexion and extension and this was checked. Posterior horns of the menisci were resected at this point.  Once again, the patella was measured, it was 24 mm.  Because the patella was relatively small, we set the cutting guide at 16.8-mm cut, removed the posterior 8 mm, the patella sized to a 32 button and drilled the lollipop.  The knee was then hyperflexed, proximal tibia sized with a #3 tibial baseplate.  This was pinned into place followed by the smokestack conical reamer and Delta fin keel punch.  We then hammered into place a three trial right femoral component and drilled the lugs, inserted a 10- mm trial bearing and a 32 trial patellar button.  The knee was reduced, taken through range of motion from full extension to 130 degrees, good tracking was noted.  We did have to do a small lateral release to give the patellar tracking properly.  At this point, all trial components were removed, all bony surfaces were water picked, clean and dried with suction sponges, double batch of DePuy HV cement was mixed to the back table and applied to all bony metallic mating surface except the posterior condyles with femur itself.  In order, we hammered into place a three tibial tray and removed the excess cement.  A 2.5 right femoral component and removed the excess cement.  We inserted the 10-mm Sigma RP bearing, reduced the knee and then clamped into place the 32-mm patella. Excess cement was removed.  The wound was  irrigated out thoroughly with normal saline solution.  Drains were placed from an anterolateral superior parapatellar approach and after all this had been accomplished. The wound was irrigated out with pulse lavage solution one more time. The parapatellar arthrotomy was closed with running #1 Vicryl suture, the subcutaneous tissue with 0-0 and 2-0 undyed Vicryl suture and skin staples.  A dressing of Xeroform, 4x4 dressing, sponges, Webril, and an Ace wrap applied.  Tourniquet was let down.  Toes were noted to pink up. The patient was awakened, extubated and taken to the recovery room without difficulty.     Feliberto Gottron. Turner Daniels, M.D.     Ovid Curd  D:  10/27/2010  T:  10/27/2010  Job:  [id number]  Electronically Signed by Gean Birchwood M.D. on 10/28/2010 11:12:56 PM

## 2010-10-29 LAB — CBC
HCT: 26.7 % — ABNORMAL LOW (ref 36.0–46.0)
MCH: 27.3 pg (ref 26.0–34.0)
MCV: 79.2 fL (ref 78.0–100.0)
Platelets: 144 10*3/uL — ABNORMAL LOW (ref 150–400)
RBC: 3.37 MIL/uL — ABNORMAL LOW (ref 3.87–5.11)
WBC: 7.7 10*3/uL (ref 4.0–10.5)

## 2010-10-30 LAB — CBC
HCT: 26 % — ABNORMAL LOW (ref 36.0–46.0)
Hemoglobin: 8.8 g/dL — ABNORMAL LOW (ref 12.0–15.0)
MCH: 27.1 pg (ref 26.0–34.0)
MCV: 80 fL (ref 78.0–100.0)
Platelets: 143 10*3/uL — ABNORMAL LOW (ref 150–400)
RBC: 3.25 MIL/uL — ABNORMAL LOW (ref 3.87–5.11)
WBC: 5.7 10*3/uL (ref 4.0–10.5)

## 2010-11-03 LAB — POCT URINALYSIS DIP (DEVICE)
Bilirubin Urine: NEGATIVE
Hgb urine dipstick: NEGATIVE
Ketones, ur: NEGATIVE
Protein, ur: 30 — AB
Specific Gravity, Urine: 1.02
pH: 5

## 2010-11-06 LAB — POCT URINALYSIS DIP (DEVICE)
Bilirubin Urine: NEGATIVE
Glucose, UA: NEGATIVE
Hgb urine dipstick: NEGATIVE
Operator id: 303351
Specific Gravity, Urine: >=1.03
Urobilinogen, UA: 0.2

## 2010-11-15 NOTE — Discharge Summary (Signed)
  Pamela, [name] NO.:  [id number]  MEDICAL RECORD NO.:  [id number]  LOCATION:  5039                         FACILITY:  [facility]  PHYSICIAN:  Feliberto Gottron. Turner Daniels, M.D.   DATE OF BIRTH:  1970-07-26  DATE OF ADMISSION:  10/27/2010 DATE OF DISCHARGE:  10/30/2010                              DISCHARGE SUMMARY   CHIEF COMPLAINT:  Right knee pain.  HISTORY OF PRESENT ILLNESS:  This is a 40 year old lady who complains of severe unremitting pain in her right knee despite extensive conservative treatment including cortisone injection and viscosupplementation.  She now desires a surgical intervention.  All risks and benefits of surgery were discussed with the patient.  PAST MEDICAL HISTORY:  Significant for right knee arthroscopy, heart surgery, carpal tunnel release, and C-section.  She has no known drug allergies.  SOCIAL HISTORY:  She denies use of alcohol or tobacco.  FAMILY HISTORY:  Positive for diabetes, hypertension, and heart disease.  PHYSICAL EXAM:  Gross examination of the right knee demonstrates a valgus deformity and a 1+ effusion.  She is neurovascularly intact.  X-rays demonstrate end-stage arthritis in the lateral compartment of the right knee.  PREOP LABS:  White blood cells 7.6, red blood cells 3.70, hemoglobin 10.1, hematocrit 29.7, and platelets of 175.  PT 13.2 and INR 0.98. Sodium 134, potassium 4.6, chloride 100, glucose 143, BUN 10, and creatinine 0.49.  HOSPITAL COURSE:  Pamela [name] was admitted to [facility] on October 27, 2010, when she underwent right total knee arthroplasty.  The procedure was performed by Dr. Gean Birchwood and the patient tolerated it well.  Two Hemovac drains were placed into the right knee. Perioperative Foley catheter was placed and she was transferred to the floor on Lovenox and Coumadin for DVT prophylaxis.  on the first postoperative day, she was awake and alert and denied any nausea or vomiting.  Hemoglobin  was 10.1.  Foley catheter was removed after physical therapy.  Her Hemovac drain was pulled without difficulty.  On postoperative day #2, she was eating well and reporting good pain control.  Her dressing was changed and her incision was found to be benign.  On postoperative day #3, she has met all of her physical therapy goals.  Hemoglobin was 8.8, but she denied any dizziness or shortness of breath.  Her dressing remained clean and she was discharged home.  DISPOSITION:  The patient was discharged home on October 30, 2010. She was weightbearing as tolerated and would return to the clinic in 10 days for x-rays and staple removal.  She will remain on Coumadin for a total of 14 days with a target INR of 1.5-2.0.  FINAL DIAGNOSIS:  End-stage degenerative joint disease of the right knee.     Shirl Harris, PA   ______________________________ Feliberto Gottron. Turner Daniels, M.D.    JW/MEDQ  D:  11/07/2010  T:  11/07/2010  Job:  [id number]  Electronically Signed by Shirl Harris PA on 11/12/2010 09:02:35 AM Electronically Signed by Gean Birchwood M.D. on 11/15/2010 01:29:13 PM

## 2010-11-21 LAB — POCT HEMOGLOBIN-HEMACUE
Hemoglobin: 11.4 — ABNORMAL LOW
Operator id: 208731

## 2010-12-08 ENCOUNTER — Ambulatory Visit: Payer: Medicaid Other | Attending: Orthopedic Surgery | Admitting: Physical Therapy

## 2010-12-08 DIAGNOSIS — M25669 Stiffness of unspecified knee, not elsewhere classified: Secondary | ICD-10-CM | POA: Insufficient documentation

## 2010-12-08 DIAGNOSIS — IMO0001 Reserved for inherently not codable concepts without codable children: Secondary | ICD-10-CM | POA: Insufficient documentation

## 2010-12-08 DIAGNOSIS — R262 Difficulty in walking, not elsewhere classified: Secondary | ICD-10-CM | POA: Insufficient documentation

## 2010-12-08 DIAGNOSIS — M25569 Pain in unspecified knee: Secondary | ICD-10-CM | POA: Insufficient documentation

## 2010-12-25 ENCOUNTER — Ambulatory Visit: Payer: Medicaid Other | Attending: Orthopedic Surgery

## 2010-12-25 DIAGNOSIS — R262 Difficulty in walking, not elsewhere classified: Secondary | ICD-10-CM | POA: Insufficient documentation

## 2010-12-25 DIAGNOSIS — M25669 Stiffness of unspecified knee, not elsewhere classified: Secondary | ICD-10-CM | POA: Insufficient documentation

## 2010-12-25 DIAGNOSIS — IMO0001 Reserved for inherently not codable concepts without codable children: Secondary | ICD-10-CM | POA: Insufficient documentation

## 2010-12-25 DIAGNOSIS — M25569 Pain in unspecified knee: Secondary | ICD-10-CM | POA: Insufficient documentation

## 2010-12-30 ENCOUNTER — Ambulatory Visit: Payer: Medicaid Other

## 2011-01-06 ENCOUNTER — Ambulatory Visit: Payer: Medicaid Other

## 2011-04-22 ENCOUNTER — Emergency Department (INDEPENDENT_AMBULATORY_CARE_PROVIDER_SITE_OTHER)
Admission: EM | Admit: 2011-04-22 | Discharge: 2011-04-22 | Disposition: A | Discharge status: Home / Self Care | Payer: Medicaid Other | Source: Home / Self Care | Attending: Emergency Medicine | Admitting: Emergency Medicine

## 2011-04-22 ENCOUNTER — Encounter (HOSPITAL_COMMUNITY): Payer: Self-pay | Admitting: Emergency Medicine

## 2011-04-22 DIAGNOSIS — G43909 Migraine, unspecified, not intractable, without status migrainosus: Secondary | ICD-10-CM

## 2011-04-22 DIAGNOSIS — J019 Acute sinusitis, unspecified: Secondary | ICD-10-CM

## 2011-04-22 MED ORDER — SUMATRIPTAN SUCCINATE 50 MG PO TABS: 50.0000 mg | ORAL_TABLET | ORAL | Status: DC | PRN

## 2011-04-22 MED ORDER — AMOXICILLIN 500 MG PO CAPS: 1000.0000 mg | ORAL_CAPSULE | Freq: Three times a day (TID) | ORAL | Status: AC

## 2011-04-22 MED ORDER — KETOROLAC TROMETHAMINE 30 MG/ML IJ SOLN
INTRAMUSCULAR | Status: AC
  Filled 2011-04-22: qty 1

## 2011-04-22 MED ORDER — DEXAMETHASONE SODIUM PHOSPHATE 10 MG/ML IJ SOLN
10.0000 mg | Freq: Once | INTRAMUSCULAR | Status: AC
  Administered 2011-04-22: 10 mg via INTRAMUSCULAR

## 2011-04-22 MED ORDER — DEXAMETHASONE SODIUM PHOSPHATE 10 MG/ML IJ SOLN
INTRAMUSCULAR | Status: AC
  Filled 2011-04-22: qty 1

## 2011-04-22 MED ORDER — KETOROLAC TROMETHAMINE 30 MG/ML IJ SOLN: 30.0000 mg | Freq: Once | INTRAMUSCULAR | Status: DC

## 2011-04-22 MED ORDER — KETOROLAC TROMETHAMINE 30 MG/ML IJ SOLN
30.0000 mg | Freq: Once | INTRAMUSCULAR | Status: AC
  Administered 2011-04-22: 30 mg via INTRAMUSCULAR

## 2011-04-22 NOTE — Discharge Instructions (Signed)
Most upper respiratory infections are caused by viruses and do not require antibiotics.  We try to save the antibiotics for when we really need them to avoid resistance.  This does not mean that there is nothing that can be done.  Here are a few hints about things that can be done at home to get over an upper respiratory infection quicker:  Get extra sleep and extra fluids.  Get 7 to 9 hours of sleep per night and 6 to 8 glasses of water a day.  Getting extra sleep keeps the immune system from getting run down.  Most people with an upper respiratory infection are a little dehydrated.  The extra fluids also keep the secretions liquified and easier to deal with.  Also, get extra vitamin C.  4000 mg per day is the recommended dose. For the aches, headache, and fever, acetaminophen or ibuprofen are helpful.  These can be alternated every 4 hours.  People with liver disease should avoid large amounts of acetaminophen, and people with ulcer disease, gastroesophageal reflux, gastritis, congestive heart failure, chronic kidney disease, coronary artery disease and the elderly should avoid ibuprofen. For nasal congestion try Mucinex-D, or if you're having lots of sneezing or copious clear nasal drainage Allegra-D-24 hour.  A Saline nasal spray such as Ocean Spray can also help as can decongestant sprays such as Afrin, but you should not use the decongestant sprays for more than 3 or 4 days since they can be habituating.  If nasal dryness is a problem, Ayr Nasal Gel can help moisturize your nasal passages.  Breath Rite nasal strips can also offer a non-drug alternative treatment to nasal congestion, especially at night. For people with symptoms of sinusitis, sleeping with your head elevated can be helpful.  For sinus pain, moist, hot compresses to the face may provide some relief.  Many people find that inhaling steam as in a shower or from a pot of steaming water can help. For sore throat, zinc containing lozenges such  as Cold-Eze or Zicam are helpful.  Zinc helps to fight infection and has a mild astringent effect that relieves the sore, achey throat.  Hot salt water gargles (8 oz of hot water, 1/2 tsp of table salt, and a pinch of baking soda) can give relief as well as hot beverages such as hot tea. For the cough, old time remedies such as honey or honey and lemon are tried and true.  Over the counter cough syrups such as Delsym 2 tsp every 12 hours can help as well.  It's important when you have an upper respiratory infection not to pass the infection to others.  This involves being very careful about the following:  Frequent hand washing or use of hand sanitizer, especially after coughing, sneezing, blowing your nose or touching your face, nose or eyes. Do not shake hands or touch anyone and try to avoid touching surfaces that other people use such as doorknobs, shopping carts, telephones and computer keyboards. Use tissues and dispose of them properly in a garbage can or ziplock bag. Cough into your sleeve. Do not let others eat or drink after you.  It's also important to recognize the signs of serious illness and get evaluated if they occur: Any respiratory infection that lasts more than 7 to 10 days.  Yellow nasal drainage and sputum are not reliable indicators of a bacterial infection, but if they last for more than 1 week, see your doctor. Fever and sore throat can indicate strep. Fever   and cough can indicate influenza or pneumonia. Any kind of severe symptom such as difficulty breathing, intractable vomiting, or severe pain should prompt you to see a doctor as soon as possible.   Your body's immune system is really the thing that will get rid of this infection.  Your immune system is comprised of 2 types of specialized cells called T cells and B cells.  T cells coordinate the array of cells in your body that engulf invading bacteria or viruses while B cells orchestrate the production of antibodies that  neutralize infection.  Anything we do or any medications we give you, will just strengthen your immune system or help it clear up the infection quicker.  Here are a few helpful hints to improve your immune system to help overcome this illness or to prevent future infections:  A few vitamins can improve the health of your immune system.  That's why your diet should include plenty of fruits, vegetables, fish, nuts, and whole grains.  Vitamin A and bet-carotene can increase the cells that fight infections (T cells and B cells).  Vitamin A is abundant in dark greens and orange vegetables such as spinach, greens, sweet potatoes, and carrots.  Vitamin B6 contributes to the maturation of white blood cells, the cells that fight disease.  Foods with vitamin B6 include cold cereal and bananas.  Vitamin C is credited with preventing colds because it increases white blood cells and also prevents cellular damage.  Citrus fruits, peaches and green and red bell peppers are all hight in vitamin C.  Vitamin E is an anti-oxidant that encourages the production of natural killer cells which reject foreign invaders and B cells that produce antibodies.  Foods high in vitamin E include wheat germ, nuts and seeds.  Foods high in omega-3 fatty acids found in foods like salmon, tuna and mackerel boost your immune system and help cells to engulf and absorb germs.  Probiotics are good bacteria that increase your T cells.  These can be found in yogurt and are available in supplements such as Culturelle or Align.  Moderate exercise increases the strength of your immune system and your ability to recover from illness.  I suggest 3 to 5 moderate intensity 30 minute workouts per week.    Sleep is another component of maintaining a strong immune system.  It enables your body to recuperate from the day's activities, stress and work.  My recommendation is to get between 7 and 9 hours of sleep per night.  If you smoke, try to quit  completely or at least cut down.  Drink alcohol only in moderation if at all.  No more than 2 drinks daily for men or 1 for women.  Get a flu vaccine early in the fall or if you have not gotten one yet, once this illness has run its course.  If you are over 65, a smoker, or an asthmatic, get a pneumococcal vaccine.  My final recommendation is to maintain a healthy weight.  Excess weight can impair the immune system by interfering with the way the immune system deals with invading viruses or bacteria. Migraine Headache A migraine headache is an intense, throbbing pain on one or both sides of your head. The exact cause of a migraine headache is not always known. A migraine may be caused when nerves in the brain become irritated and release chemicals that cause swelling within blood vessels, causing pain. Many migraine sufferers have a family history of migraines. Before you get  a migraine you may or may not get an aura. An aura is a group of symptoms that can predict the beginning of a migraine. An aura may include: Visual changes such as:  Flashing lights.  Bright spots or zig-zag lines.  Tunnel vision.  Feelings of numbness.  Trouble talking.  Muscle weakness.  SYMPTOMS Pain on one or both sides of your head.  Pain that is pulsating or throbbing in nature.  Pain that is severe enough to prevent daily activities.  Pain that is aggravated by any daily physical activity.  Nausea (feeling sick to your stomach), vomiting, or both.  Pain with exposure to bright lights, loud noises, or activity.  General sensitivity to bright lights or loud noises.  MIGRAINE TRIGGERS Examples of triggers of migraine headaches include:  Alcohol.  Smoking.  Stress.  It may be related to menses (female menstruation).  Aged cheeses.  Foods or drinks that contain nitrates, glutamate, aspartame, or tyramine.  Lack of sleep.  Chocolate.  Caffeine.  Hunger.  Medications such as nitroglycerine (used to treat chest  pain), birth control pills, estrogen, and some blood pressure medications.  DIAGNOSIS  A migraine headache is often diagnosed based on: Symptoms.  Physical examination.  A computerized X-ray scan (computed tomography, CT) of your head.  TREATMENT Medications can help prevent migraines if they are recurrent or should they become recurrent. Your caregiver can help you with a medication or treatment program that will be helpful to you.  Lying down in a dark, quiet room may be helpful.  Keeping a headache diary may help you find a trend as to what may be triggering your headaches.  SEEK IMMEDIATE MEDICAL CARE IF:  You have confusion, personality changes or seizures.  You have headaches that wake you from sleep.  You have an increased frequency in your headaches.  You have a stiff neck.  You have a loss of vision.  You have muscle weakness.  You start losing your balance or have trouble walking.  You feel faint or pass out.  MAKE SURE YOU:  Understand these instructions.  Will watch your condition.  Will get help right away if you are not doing well or get worse.  Document Released: 01/26/2005 Document Revised: 01/15/2011 Document Reviewed: 09/11/2008 [facility] Patient Information 2012 [place], [place].

## 2011-04-22 NOTE — ED Provider Notes (Signed)
Chief Complaint  Patient presents with  . Sinusitis  . URI    History of Present Illness:   The patient is a 41 year old female who has had a one-week history of nasal congestion, facial pain, left ear pain, sore throat, headache, chills, and nausea. She denies any cough. The sinus symptoms have brought on migraine type headache. She usually uses Imitrex for this, but has run out and now she is just using Excedrin Migraine. She would like to get a shot today.  Review of Systems:  Other than noted above, the patient denies any of the following symptoms. Systemic:  No fever, chills, sweats, fatigue, myalgias, headache, or anorexia. Eye:  No redness, pain or drainage. ENT:  No earache, nasal congestion, rhinorrhea, sinus pressure, or sore throat. Lungs:  No cough, sputum production, wheezing, shortness of breath. Or chest pain. GI:  No nausea, vomiting, abdominal pain or diarrhea. Skin:  No rash or itching.  PMFSH:  Past medical history, family history, social history, meds, and allergies were reviewed.  Physical Exam:   Vital signs:  BP 113/65  Pulse 50  Temp(Src) 98.1 F (36.7 C) (Oral)  Resp 14  SpO2 100% General:  Alert, in no distress. Eye:  No conjunctival injection or drainage. ENT:  TMs and canals were normal, without erythema or inflammation.  Nasal mucosa was clear and uncongested, without drainage.  Mucous membranes were moist.  Pharynx was clear, without exudate or drainage.  There were no oral ulcerations or lesions. Neck:  Supple, no adenopathy, tenderness or mass. Lungs:  No respiratory distress.  Lungs were clear to auscultation, without wheezes, rales or rhonchi.  Breath sounds were clear and equal bilaterally. Heart:  Regular rhythm, without gallops, murmers or rubs. Skin:  Clear, warm, and dry, without rash or lesions.  Assessment:   Diagnoses that have been ruled out:  None  Diagnoses that are still under consideration:  None  Final diagnoses:  Acute sinusitis    Migraine headache      Plan:   1.  The following meds were prescribed:   New Prescriptions   AMOXICILLIN (AMOXIL) 500 MG CAPSULE    Take 2 capsules (1,000 mg total) by mouth 3 (three) times daily.   SUMATRIPTAN (IMITREX) 50 MG TABLET    Take 1 tablet (50 mg total) by mouth every 2 (two) hours as needed for migraine.   2.  The patient was instructed in symptomatic care and handouts were given. 3.  The patient was told to return if becoming worse in any way, if no better in 3 or 4 days, and given some red flag symptoms that would indicate earlier return.   Reuben Likes, MD 04/22/11 1740

## 2011-04-22 NOTE — ED Notes (Signed)
HERE WITH SINUS SX THAT STARTED YESTERDAY WITH X 1 EPISODE OF DIARRHEA THEN PROGRESSED WITH SORE THROAT,FACIAL PRESSURE AND DRAINAGE AND THROB H/A.PT HAS BEEN USING SINUS COLD MEDS BUT NOT WORKING.AFEBRILE

## 2011-07-13 ENCOUNTER — Ambulatory Visit (HOSPITAL_BASED_OUTPATIENT_CLINIC_OR_DEPARTMENT_OTHER): Admission: RE | Admit: 2011-07-13 | Payer: Medicaid Other | Source: Ambulatory Visit | Admitting: Orthopedic Surgery

## 2011-07-13 ENCOUNTER — Encounter (HOSPITAL_BASED_OUTPATIENT_CLINIC_OR_DEPARTMENT_OTHER): Admission: RE | Payer: Self-pay | Source: Ambulatory Visit

## 2011-07-13 SURGERY — CARPAL TUNNEL RELEASE
Anesthesia: Choice | Laterality: Right

## 2011-07-20 ENCOUNTER — Emergency Department (HOSPITAL_COMMUNITY)
Admission: EM | Admit: 2011-07-20 | Discharge: 2011-07-21 | Disposition: A | Discharge status: Home / Self Care | Payer: Medicaid Other | Attending: Emergency Medicine | Admitting: Emergency Medicine

## 2011-07-20 ENCOUNTER — Encounter (HOSPITAL_COMMUNITY): Payer: Self-pay | Admitting: *Deleted

## 2011-07-20 DIAGNOSIS — I517 Cardiomegaly: Secondary | ICD-10-CM | POA: Insufficient documentation

## 2011-07-20 DIAGNOSIS — E669 Obesity, unspecified: Secondary | ICD-10-CM | POA: Insufficient documentation

## 2011-07-20 DIAGNOSIS — I491 Atrial premature depolarization: Secondary | ICD-10-CM | POA: Insufficient documentation

## 2011-07-20 DIAGNOSIS — Z79899 Other long term (current) drug therapy: Secondary | ICD-10-CM | POA: Insufficient documentation

## 2011-07-20 DIAGNOSIS — R002 Palpitations: Secondary | ICD-10-CM | POA: Insufficient documentation

## 2011-07-20 DIAGNOSIS — R079 Chest pain, unspecified: Secondary | ICD-10-CM | POA: Insufficient documentation

## 2011-07-20 DIAGNOSIS — B009 Herpesviral infection, unspecified: Secondary | ICD-10-CM | POA: Insufficient documentation

## 2011-07-20 DIAGNOSIS — M1990 Unspecified osteoarthritis, unspecified site: Secondary | ICD-10-CM | POA: Insufficient documentation

## 2011-07-20 DIAGNOSIS — Z7982 Long term (current) use of aspirin: Secondary | ICD-10-CM | POA: Insufficient documentation

## 2011-07-20 DIAGNOSIS — G43909 Migraine, unspecified, not intractable, without status migrainosus: Secondary | ICD-10-CM | POA: Insufficient documentation

## 2011-07-20 LAB — CBC
HCT: 38.3 % (ref 36.0–46.0)
Hemoglobin: 12.9 g/dL (ref 12.0–15.0)
MCHC: 33.7 g/dL (ref 30.0–36.0)
RDW: 13.8 % (ref 11.5–15.5)
WBC: 6.7 10*3/uL (ref 4.0–10.5)

## 2011-07-20 LAB — DIFFERENTIAL
Basophils Absolute: 0 10*3/uL (ref 0.0–0.1)
Basophils Relative: 0 % (ref 0–1)
Lymphocytes Relative: 36 % (ref 12–46)
Monocytes Absolute: 0.5 10*3/uL (ref 0.1–1.0)
Monocytes Relative: 7 % (ref 3–12)
Neutro Abs: 3.7 10*3/uL (ref 1.7–7.7)
Neutrophils Relative %: 56 % (ref 43–77)

## 2011-07-20 NOTE — ED Notes (Signed)
The pt has carpal tunnel surgery last Wednesday and was given general anesthesia

## 2011-07-20 NOTE — ED Notes (Signed)
The pt has  Lt upper chest pain since 0300am  Today.  Some sob nausea..  She has also had an irregular heart rate

## 2011-07-21 ENCOUNTER — Emergency Department (HOSPITAL_COMMUNITY): Payer: Medicaid Other

## 2011-07-21 LAB — COMPREHENSIVE METABOLIC PANEL
AST: 24 U/L (ref 0–37)
Albumin: 4.3 g/dL (ref 3.5–5.2)
Alkaline Phosphatase: 89 U/L (ref 39–117)
CO2: 22 mEq/L (ref 19–32)
Chloride: 101 mEq/L (ref 96–112)
GFR calc non Af Amer: >90 mL/min (ref >90)
Potassium: 3.6 mEq/L (ref 3.5–5.1)
Total Bilirubin: 0.2 mg/dL — ABNORMAL LOW (ref 0.3–1.2)

## 2011-07-21 NOTE — ED Provider Notes (Signed)
History     CSN: [id number]  Arrival date & time 07/20/11  2212   First MD Initiated Contact with Patient 07/21/11 0030      Chief Complaint  Patient presents with  . Chest Pain    (Consider location/radiation/quality/duration/timing/severity/associated sxs/prior treatment) HPI Comments: 41 year old female with a history of recent carpal tunnel surgery on her right hand who presents with a complaint of palpitations. She states that this has been intermittent, gradual onset 22 hours ago, gradually getting worse with more frequent palpitations. She had associated chest pain which awoke her from sleep yesterday but totally went away after taking a proton pump inhibitor. She has a history of some acid reflux and states that this felt like her acid reflux. Since that time the chest pain has not returned. She denies swelling in her lower extremities, shortness of breath, cough, fever. She denies recent travel trauma or immobilization and other than her right hand surgery she has had no other major surgeries. She also denies any exogenous estrogen use. She denies alcohol intake, stimulant intake and has been taking only the occasional Vicodin pill for her pain. She denies a history of cardiac disease and states that she does have the occasional palpitation but not as frequent as it has been over the last 22 hours  Patient is a 41 y.o. female presenting with chest pain. The history is provided by the patient.  Chest Pain     Past Medical History  Diagnosis Date  . Chest pain   . Degenerative joint disease   . Migraine   . Obesity   . Herpes simplex     Past Surgical History  Procedure Date  . Cesarean section   . Knee surgery   . Exploration post operative open heart     Family History  Problem Relation Age of Onset  . Coronary artery disease Mother 70  . Other       There is also history of diabetes, stroke, and cancer in the family    History  Substance Use Topics  . Smoking  status: Never Smoker   . Smokeless tobacco: Not on file  . Alcohol Use: No    OB History    Grav Para Term Preterm Abortions TAB SAB Ect Mult Living                  Review of Systems  Cardiovascular: Positive for chest pain.  All other systems reviewed and are negative.    Allergies  Imitrex and Vicodin  Home Medications   Current Outpatient Rx  Name Route Sig Dispense Refill  . ASPIRIN 325 MG PO TABS Oral Take 325 mg by mouth daily.    Marland Kitchen METHOCARBAMOL 500 MG PO TABS Oral Take 500 mg by mouth every 6 (six) hours as needed. For pain.    Marland Kitchen RABEPRAZOLE SODIUM 20 MG PO TBEC Oral Take 20 mg by mouth daily.      BP 126/60  Pulse 54  Temp(Src) 98.6 F (37 C) (Oral)  Resp 22  SpO2 99%  Physical Exam  Nursing note and vitals reviewed. Constitutional: She appears well-developed and well-nourished. No distress.  HENT:  Head: Normocephalic and atraumatic.  Mouth/Throat: Oropharynx is clear and moist. No oropharyngeal exudate.  Eyes: Conjunctivae and EOM are normal. Pupils are equal, round, and reactive to light. Right eye exhibits no discharge. Left eye exhibits no discharge. No scleral icterus.  Neck: Normal range of motion. Neck supple. No JVD present. No thyromegaly present.  Cardiovascular: Normal rate, regular rhythm, normal heart sounds and intact distal pulses.  Exam reveals no gallop and no friction rub.   No murmur heard.      Occasional ectopy, strong peripheral pulses, normal capillary refill less than 2 seconds  Pulmonary/Chest: Effort normal and breath sounds normal. No respiratory distress. She has no wheezes. She has no rales.  Abdominal: Soft. Bowel sounds are normal. She exhibits no distension and no mass. There is no tenderness.  Musculoskeletal: Normal range of motion. She exhibits no edema and no tenderness.  Lymphadenopathy:    She has no cervical adenopathy.  Neurological: She is alert. Coordination normal.  Skin: Skin is warm and dry. No rash noted. No  erythema.  Psychiatric: She has a normal mood and affect. Her behavior is normal.    ED Course  Procedures (including critical care time)  Labs Reviewed  COMPREHENSIVE METABOLIC PANEL - Abnormal; Notable for the following:    Total Bilirubin 0.2 (*)    All other components within normal limits  CBC  DIFFERENTIAL  TROPONIN I  TSH   Dg Chest 2 View  07/21/2011  *RADIOLOGY REPORT*  Clinical Data: Chest pain.  CHEST - 2 VIEW  Comparison: 06/28/2010  Findings: Lateral view degraded by patient arm position.  Midline trachea.  Borderline cardiomegaly.      Mediastinal contours otherwise within normal limits.  No pleural effusion or pneumothorax.  Clear lungs.  IMPRESSION: Borderline cardiomegaly, without acute disease.  Original Report Authenticated By: Consuello Bossier, M.D.     1. Palpitations   2. PAC (premature atrial contraction)       MDM  Overall the patient is well-appearing, the rate on the monitor is a sinus bradycardia in the upper 50s with occasional PACs. Comparing EKG from today to 2012 which, there does appear to be some ectopic atrial rhythm however it is unchanged from that time other than occasional ectopy.  Lab work shows normal electrolytes renal function and blood counts, normal troponin and a chest x-ray which showed borderline cardiomegaly but no other findings. We'll obtain a TSH, have the patient followup with her family Dr. to acquire these results and to have further workup regarding her borderline cardiomegaly. At this time  the vital signs are normal, she has a borderline bradycardia but has no signs of shock. She has been eating and drinking, has no urinary complaints, no bowel complaints, no fevers and appears stable for discharge.  ED ECG REPORT   Date: 07/21/2011   Rate: 65  Rhythm: normal sinus rhythm and premature atrial contractions (PAC)  QRS Axis: normal  Intervals: normal  ST/T Wave abnormalities: nonspecific T wave changes  Conduction  Disutrbances:none  Narrative Interpretation:   Old EKG Reviewed: Since 06/29/2010, no significant changes other than an increase in premature atrial contractions   X-ray shows borderline cardiomegaly, results discussed with patient, she appears stable for followup. There is no tachycardia, normal blood pressure and essentially asymptomatic at this point. Labs reviewed showing no signs of hypokalemia or anemia. Troponin normal  Vida Roller, MD 07/21/11 [phone]

## 2011-07-21 NOTE — Discharge Instructions (Signed)
Your blood tests and x-ray overall were very normal, however you do need to have her blood work followed up by your family doctor because you're thyroid test will not come back for 24 hours. Also you need to have further workup regarding the size of your heart as the x-ray showed that it was borderline enlarged. This finding should be followed up in the next couple of weeks when you followup for your thyroid tests. Return to the hospital immediately for severe or worsening symptoms including palpitations, chest pain, difficulty breathing or fainting spells.

## 2011-11-05 ENCOUNTER — Emergency Department (HOSPITAL_COMMUNITY): Payer: Self-pay

## 2011-11-05 ENCOUNTER — Observation Stay (HOSPITAL_COMMUNITY)
Admission: EM | Admit: 2011-11-05 | Discharge: 2011-11-05 | Disposition: A | Discharge status: Home / Self Care | Payer: Self-pay | Attending: Emergency Medicine | Admitting: Emergency Medicine

## 2011-11-05 ENCOUNTER — Encounter (HOSPITAL_COMMUNITY): Payer: Self-pay | Admitting: Cardiology

## 2011-11-05 DIAGNOSIS — R61 Generalized hyperhidrosis: Secondary | ICD-10-CM | POA: Insufficient documentation

## 2011-11-05 DIAGNOSIS — R0789 Other chest pain: Secondary | ICD-10-CM

## 2011-11-05 DIAGNOSIS — R110 Nausea: Secondary | ICD-10-CM | POA: Insufficient documentation

## 2011-11-05 DIAGNOSIS — R079 Chest pain, unspecified: Principal | ICD-10-CM | POA: Insufficient documentation

## 2011-11-05 LAB — CBC WITH DIFFERENTIAL/PLATELET
Eosinophils Relative: 1 % (ref 0–5)
Lymphocytes Relative: 41 % (ref 12–46)
Lymphs Abs: 3.6 10*3/uL (ref 0.7–4.0)
MCV: 81.5 fL (ref 78.0–100.0)
Neutrophils Relative %: 50 % (ref 43–77)
Platelets: 193 10*3/uL (ref 150–400)
RBC: 4.54 MIL/uL (ref 3.87–5.11)
WBC: 8.8 10*3/uL (ref 4.0–10.5)

## 2011-11-05 LAB — URINALYSIS, ROUTINE W REFLEX MICROSCOPIC
Glucose, UA: NEGATIVE mg/dL
Hgb urine dipstick: NEGATIVE
Specific Gravity, Urine: 1.021 (ref 1.005–1.030)
pH: 5.5 (ref 5.0–8.0)

## 2011-11-05 LAB — TROPONIN I: Troponin I: <0.3 ng/mL (ref <0.30)

## 2011-11-05 LAB — BASIC METABOLIC PANEL
CO2: 25 mEq/L (ref 19–32)
Glucose, Bld: 115 mg/dL — ABNORMAL HIGH (ref 70–99)
Potassium: 3.4 mEq/L — ABNORMAL LOW (ref 3.5–5.1)
Sodium: 139 mEq/L (ref 135–145)

## 2011-11-05 LAB — POCT I-STAT TROPONIN I: Troponin i, poc: 0 ng/mL (ref 0.00–0.08)

## 2011-11-05 MED ORDER — MORPHINE SULFATE 4 MG/ML IJ SOLN
4.0000 mg | Freq: Once | INTRAMUSCULAR | Status: DC
  Filled 2011-11-05: qty 1

## 2011-11-05 MED ORDER — NITROGLYCERIN 0.4 MG SL SUBL
0.4000 mg | SUBLINGUAL_TABLET | SUBLINGUAL | Status: AC | PRN
  Administered 2011-11-05 (×3): 0.4 mg via SUBLINGUAL
  Filled 2011-11-05: qty 25

## 2011-11-05 MED ORDER — KETOROLAC TROMETHAMINE 30 MG/ML IJ SOLN
30.0000 mg | Freq: Once | INTRAMUSCULAR | Status: AC
  Administered 2011-11-05: 30 mg via INTRAVENOUS
  Filled 2011-11-05: qty 1

## 2011-11-05 MED ORDER — OXYCODONE-ACETAMINOPHEN 5-325 MG PO TABS: 1.0000 | ORAL_TABLET | Freq: Four times a day (QID) | ORAL | Status: DC | PRN

## 2011-11-05 MED ORDER — OXYCODONE-ACETAMINOPHEN 5-325 MG PO TABS
2.0000 | ORAL_TABLET | Freq: Once | ORAL | Status: AC
  Administered 2011-11-05: 2 via ORAL
  Filled 2011-11-05: qty 2

## 2011-11-05 MED ORDER — ALBUTEROL SULFATE HFA 108 (90 BASE) MCG/ACT IN AERS
2.0000 | INHALATION_SPRAY | RESPIRATORY_TRACT | Status: DC | PRN
  Administered 2011-11-05: 2 via RESPIRATORY_TRACT
  Filled 2011-11-05: qty 6.7

## 2011-11-05 NOTE — ED Provider Notes (Signed)
Pt placed in CDU on chest pain protocol. Unable to stress or do cardiac CT angio of the heart. Pt originally seen by Melina Schools, PA-C.   PE: Chest: Mild tenderness to palpation, heart rate and rhythm wnl, no murmur.  During stay in the ED the patients enzymes were cycled every 3 hours. All 3 enzymes negative. Normal EKG, normal chest xray and vitals WNL. The patient has described some mild discomfort during her stay which was easily relieved with some pain medication.  After discussing results with patient, we have decided to give her a referral for outpatient cardiology work-up.  Plan: 1.cardiology referral 2. Pain medications 3. Albuterol inhaler  Dorthula Matas, PA 11/05/11 1711

## 2011-11-05 NOTE — ED Notes (Signed)
IV team paged for 18 gauge start.

## 2011-11-05 NOTE — Progress Notes (Signed)
Utilization review completed.  

## 2011-11-05 NOTE — ED Notes (Signed)
Pt c/o chest pain starting yesterday.

## 2011-11-05 NOTE — ED Provider Notes (Signed)
History     CSN: [id number]  Arrival date & time 11/05/11  0843   First MD Initiated Contact with Patient 11/05/11 0857      No chief complaint on file.   (Consider location/radiation/quality/duration/timing/severity/associated sxs/prior treatment) HPI Comments: 41 y/o female presents to ED complaining of chest pain x 2 weeks worsening around 1:30 am this morning. Her chest pain has been intermittent up until this morning when it has remained constant. The chest pain would come and go at random, no specific activity would bring it on. She describes the pain as a sharp pain lasting about 20-30 minutes for each episode rated 8/10. This morning the chest pain began to radiate down her left arm and up left side of her neck. She had associated diaphoresis and nausea. She's never had chest pain like this before. The only chest pain she had in the past was for palpitations which she is not experiencing at this time. He try taking ibuprofen without any relief. She has no significant medical history including hypertension, CAD or high cholesterol. She is a nonsmoker. Her mom had a heart attack at the age of 73.  The history is provided by the patient.    Past Medical History  Diagnosis Date  . Chest pain   . Degenerative joint disease   . Migraine   . Obesity   . Herpes simplex     Past Surgical History  Procedure Date  . Cesarean section   . Knee surgery   . Exploration post operative open heart     Family History  Problem Relation Age of Onset  . Coronary artery disease Mother 20  . Other       There is also history of diabetes, stroke, and cancer in the family    History  Substance Use Topics  . Smoking status: Never Smoker   . Smokeless tobacco: Not on file  . Alcohol Use: No    OB History    Grav Para Term Preterm Abortions TAB SAB Ect Mult Living                  Review of Systems  Constitutional: Positive for diaphoresis. Negative for fever, chills, activity  change, appetite change and fatigue.  HENT: Positive for neck pain.   Eyes: Negative for visual disturbance.  Respiratory: Positive for shortness of breath. Negative for cough.   Cardiovascular: Positive for chest pain and leg swelling. Negative for palpitations.  Gastrointestinal: Positive for nausea. Negative for vomiting and abdominal pain.  Genitourinary: Negative for difficulty urinating.  Musculoskeletal: Negative for back pain.  Skin: Negative for color change and rash.  Neurological: Negative for dizziness, weakness, light-headedness and numbness.  Psychiatric/Behavioral: Negative for confusion.    Allergies  Imitrex and Vicodin  Home Medications  No current outpatient prescriptions on file.  BP 130/73  Pulse 51  Temp 98.6 F (37 C) (Oral)  Resp 18  SpO2 99%  Physical Exam  Constitutional: She is oriented to person, place, and time. She appears well-developed. No distress.       obese  HENT:  Head: Normocephalic and atraumatic.  Mouth/Throat: Oropharynx is clear and moist.  Eyes: Conjunctivae normal and EOM are normal. Pupils are equal, round, and reactive to light.  Neck: Normal range of motion. Neck supple. No JVD present.  Cardiovascular: Regular rhythm, normal heart sounds and intact distal pulses.  Bradycardia present.        +1 pitting edema lower extremities b/l moreso around ankles  Pulmonary/Chest: Effort normal and breath sounds normal. She has no rhonchi. She has no rales. She exhibits no tenderness.  Abdominal: Soft. Bowel sounds are normal. There is no tenderness.  Musculoskeletal: Normal range of motion.  Neurological: She is alert and oriented to person, place, and time.  Skin: Skin is dry. No rash noted. She is not diaphoretic.  Psychiatric: She has a normal mood and affect. Her behavior is normal.    ED Course  Procedures (including critical care time)   Labs Reviewed  CBC WITH DIFFERENTIAL  BASIC METABOLIC PANEL  URINALYSIS, ROUTINE W  REFLEX MICROSCOPIC   Date: 11/05/2011  Rate: 51  Rhythm: sinus bradycardia  QRS Axis: normal  Intervals: normal  ST/T Wave abnormalities: normal  Conduction Disutrbances:none  Narrative Interpretation: no stemi  Old EKG Reviewed: unchanged   Dg Chest 2 View  11/05/2011  *RADIOLOGY REPORT*  Clinical Data: Chest pain.  CHEST - 2 VIEW  Comparison: July 21, 2011.  Findings: Cardiomediastinal silhouette appears normal.  No acute pulmonary disease is noted.  Bony thorax is intact.  IMPRESSION: No acute cardiopulmonary abnormality seen.   Original Report Authenticated By: Venita Sheffield., M.D.    Results for orders placed during the hospital encounter of 11/05/11  CBC WITH DIFFERENTIAL      Component Value Range   WBC 8.8  4.0 - 10.5 K/uL   RBC 4.54  3.87 - 5.11 MIL/uL   Hemoglobin 12.3  12.0 - 15.0 g/dL   HCT 16.1  09.6 - 04.5 %   MCV 81.5  78.0 - 100.0 fL   MCH 27.1  26.0 - 34.0 pg   MCHC 33.2  30.0 - 36.0 g/dL   RDW 40.9  81.1 - 91.4 %   Platelets 193  150 - 400 K/uL   Neutrophils Relative 50  43 - 77 %   Neutro Abs 4.4  1.7 - 7.7 K/uL   Lymphocytes Relative 41  12 - 46 %   Lymphs Abs 3.6  0.7 - 4.0 K/uL   Monocytes Relative 8  3 - 12 %   Monocytes Absolute 0.7  0.1 - 1.0 K/uL   Eosinophils Relative 1  0 - 5 %   Eosinophils Absolute 0.1  0.0 - 0.7 K/uL   Basophils Relative 0  0 - 1 %   Basophils Absolute 0.0  0.0 - 0.1 K/uL  BASIC METABOLIC PANEL      Component Value Range   Sodium 139  135 - 145 mEq/L   Potassium 3.4 (*) 3.5 - 5.1 mEq/L   Chloride 103  96 - 112 mEq/L   CO2 25  19 - 32 mEq/L   Glucose, Bld 115 (*) 70 - 99 mg/dL   BUN 13  6 - 23 mg/dL   Creatinine, Ser 7.82  0.50 - 1.10 mg/dL   Calcium 9.5  8.4 - 95.6 mg/dL   GFR calc non Af Amer >90  >90 mL/min   GFR calc Af Amer >90  >90 mL/min  URINALYSIS, ROUTINE W REFLEX MICROSCOPIC      Component Value Range   Color, Urine YELLOW  YELLOW   APPearance CLEAR  CLEAR   Specific Gravity, Urine 1.021  1.005 - 1.030     pH 5.5  5.0 - 8.0   Glucose, UA NEGATIVE  NEGATIVE mg/dL   Hgb urine dipstick NEGATIVE  NEGATIVE   Bilirubin Urine NEGATIVE  NEGATIVE   Ketones, ur NEGATIVE  NEGATIVE mg/dL   Protein, ur NEGATIVE  NEGATIVE mg/dL  Urobilinogen, UA 0.2  0.0 - 1.0 mg/dL   Nitrite NEGATIVE  NEGATIVE   Leukocytes, UA NEGATIVE  NEGATIVE  TROPONIN I      Component Value Range   Troponin I <0.30  <0.30 ng/mL      No diagnosis found.    MDM  10:14 AM  Chest pain decreased to a 5/10 from an 8/10 after receiving sublingual nitroglycerin. Will give another sublingual nitroglycerin. 11:15 AM Patient states her pain has remained 5/10. Denies any shortness of breath. I will get one more sublingual nitroglycerin and reassess. 11:29 AM No change with third nitroglycerin. Still waiting for troponin results. 12:26 PM Troponin negative. Patient now states she is pain-free. She will be moved to CDU for chest pain protocol. The case has been discussed with Arthor Captain, PA-C will take over care of patient at this time.      Trevor Mace, PA-C 11/05/11 1229

## 2011-11-05 NOTE — ED Notes (Signed)
Pt taken to xray 

## 2011-11-05 NOTE — ED Notes (Signed)
istat troponin 0.1

## 2011-11-05 NOTE — ED Notes (Signed)
I-stat troponin 0.00 

## 2011-11-05 NOTE — ED Provider Notes (Signed)
12:56 PM Assumed care of the patient in CDU. Patient presented to the emergency department with approximately 2 weeks of  chest pain , worsening. Last night, with associated nausea and diaphoresis. She was given aspirin and nitroglycerin pain down to 5/10. Troponins and EKG have been negative. Vital signs stable except for bradycardia. Risk factors include mother MI at age 40.  Patient sent over on CP protocol but does not qualify for CTA due to BMI, and cannot do echo stress due to previous knee arthroplasty.  We will cycle her troponins. Patient c/o mild chest pain 2/10 currently. Obese female NAD. CV: RRR, No M/R/G, Peripheral pulses intact. No peripheral edema. Lungs: CTAB Abd: Soft, Non tender, non distended     3:15 PM I have given report of the patient to Marlon Pel, PA-C who has assumed care of the patient.  Arthor Captain, PA-C 11/07/11 1906

## 2011-11-06 NOTE — ED Provider Notes (Signed)
Medical screening examination/treatment/procedure(s) were performed by non-physician practitioner and as supervising physician I was immediately available for consultation/collaboration.   Marwan T Powers, MD 11/06/11 0718 

## 2011-11-07 NOTE — ED Provider Notes (Signed)
Medical screening examination/treatment/procedure(s) were performed by non-physician practitioner and as supervising physician I was immediately available for consultation/collaboration.   Ellowyn Rieves, MD 11/07/11 0650 

## 2011-11-08 NOTE — ED Provider Notes (Signed)
Medical screening examination/treatment/procedure(s) were performed by non-physician practitioner and as supervising physician I was immediately available for consultation/collaboration.   Kathe Wirick T Zacherie Honeyman, MD 11/08/11 0717 

## 2012-01-06 ENCOUNTER — Emergency Department (INDEPENDENT_AMBULATORY_CARE_PROVIDER_SITE_OTHER)
Admission: EM | Admit: 2012-01-06 | Discharge: 2012-01-06 | Disposition: A | Discharge status: Home / Self Care | Payer: Self-pay | Source: Home / Self Care | Attending: Emergency Medicine | Admitting: Emergency Medicine

## 2012-01-06 ENCOUNTER — Encounter (HOSPITAL_COMMUNITY): Payer: Self-pay

## 2012-01-06 DIAGNOSIS — B009 Herpesviral infection, unspecified: Secondary | ICD-10-CM

## 2012-01-06 DIAGNOSIS — B001 Herpesviral vesicular dermatitis: Secondary | ICD-10-CM

## 2012-01-06 DIAGNOSIS — R51 Headache: Secondary | ICD-10-CM

## 2012-01-06 MED ORDER — ACYCLOVIR 400 MG PO TABS: 400.0000 mg | ORAL_TABLET | Freq: Three times a day (TID) | ORAL | Status: AC

## 2012-01-06 MED ORDER — AMOXICILLIN 500 MG PO CAPS: 500.0000 mg | ORAL_CAPSULE | Freq: Three times a day (TID) | ORAL | Status: DC

## 2012-01-06 NOTE — ED Provider Notes (Signed)
History     CSN: [id number]  Arrival date & time 01/06/12  1214   First MD Initiated Contact with Patient 01/06/12 1238      Chief Complaint  Patient presents with  . Headache    (Consider location/radiation/quality/duration/timing/severity/associated sxs/prior treatment) HPI Comments: Patient presents to urgent care complaining of a headache on and off for the last 3 weeks she perceives as her migraines. She has also been having sinus congestion for the last month and most recently within the last few days had developed a cold sore on her upper lip. Been using a blisterex ointment  Patient is a 41 y.o. female presenting with headaches. The history is provided by the patient.  Headache The primary symptoms include headaches. Primary symptoms do not include syncope, loss of consciousness, altered mental status, seizures, dizziness, focal weakness, memory loss, fever, nausea or vomiting. The symptoms began less than 1 hour ago. The neurological symptoms are focal.  The headache is not associated with neck stiffness.  Additional symptoms do not include neck stiffness, pain, hearing loss or tinnitus. Medical issues do not include seizures.    Past Medical History  Diagnosis Date  . Chest pain   . Degenerative joint disease   . Migraine   . Obesity   . Herpes simplex     Past Surgical History  Procedure Date  . Cesarean section   . Knee surgery   . Exploration post operative open heart     Family History  Problem Relation Age of Onset  . Coronary artery disease Mother 94  . Other       There is also history of diabetes, stroke, and cancer in the family    History  Substance Use Topics  . Smoking status: Never Smoker   . Smokeless tobacco: Not on file  . Alcohol Use: No    OB History    Grav Para Term Preterm Abortions TAB SAB Ect Mult Living                  Review of Systems  Constitutional: Negative for fever, chills, activity change and appetite change.    HENT: Negative for hearing loss, neck stiffness and tinnitus.   Cardiovascular: Negative for syncope.  Gastrointestinal: Negative for nausea and vomiting.  Skin: Negative for rash.  Neurological: Positive for headaches. Negative for dizziness, focal weakness, seizures, loss of consciousness and facial asymmetry.  Psychiatric/Behavioral: Negative for memory loss and altered mental status.    Allergies  Imitrex and Vicodin  Home Medications   Current Outpatient Rx  Name  Route  Sig  Dispense  Refill  . ACYCLOVIR 400 MG PO TABS   Oral   Take 1 tablet (400 mg total) by mouth 3 (three) times daily.   15 tablet   0   . AMOXICILLIN 500 MG PO CAPS   Oral   Take 1 capsule (500 mg total) by mouth 3 (three) times daily.   30 capsule   0     BP 143/75  Pulse 55  Temp 98.1 F (36.7 C) (Oral)  Resp 18  SpO2 96%  Physical Exam  Vitals reviewed. Constitutional: She is oriented to person, place, and time. Vital signs are normal. She appears well-developed and well-nourished.  Non-toxic appearance. She does not have a sickly appearance. She does not appear ill. No distress.  HENT:  Right Ear: Tympanic membrane normal.  Left Ear: Tympanic membrane normal.  Mouth/Throat: Uvula is midline, oropharynx is clear and moist and mucous  membranes are normal.    Abdominal: Soft.  Neurological: She is alert and oriented to person, place, and time.  Skin: Rash noted.    ED Course  Procedures (including critical care time)  Labs Reviewed - No data to display No results found.   1. Headache   2. Herpes simplex labialis       MDM  Herpes simplex Labialis : Headache sinusitis. Patient was prescribed as acyclovir + amoxicillin.        Jimmie Molly, MD 01/06/12 1349

## 2012-01-06 NOTE — ED Notes (Signed)
Reports 3 week duration of HA, now more right sided , some green secretions; now reports facial swelling, pain behind eyes, eruption of lip lesion

## 2012-02-16 ENCOUNTER — Emergency Department (HOSPITAL_COMMUNITY)
Admission: EM | Admit: 2012-02-16 | Discharge: 2012-02-16 | Disposition: A | Discharge status: Home / Self Care | Payer: Self-pay | Attending: Emergency Medicine | Admitting: Emergency Medicine

## 2012-02-16 ENCOUNTER — Encounter (HOSPITAL_COMMUNITY): Payer: Self-pay | Admitting: Emergency Medicine

## 2012-02-16 DIAGNOSIS — IMO0002 Reserved for concepts with insufficient information to code with codable children: Secondary | ICD-10-CM | POA: Insufficient documentation

## 2012-02-16 DIAGNOSIS — R509 Fever, unspecified: Secondary | ICD-10-CM | POA: Insufficient documentation

## 2012-02-16 DIAGNOSIS — J111 Influenza due to unidentified influenza virus with other respiratory manifestations: Secondary | ICD-10-CM

## 2012-02-16 DIAGNOSIS — Z7982 Long term (current) use of aspirin: Secondary | ICD-10-CM | POA: Insufficient documentation

## 2012-02-16 DIAGNOSIS — E669 Obesity, unspecified: Secondary | ICD-10-CM | POA: Insufficient documentation

## 2012-02-16 DIAGNOSIS — R079 Chest pain, unspecified: Secondary | ICD-10-CM | POA: Insufficient documentation

## 2012-02-16 DIAGNOSIS — IMO0001 Reserved for inherently not codable concepts without codable children: Secondary | ICD-10-CM | POA: Insufficient documentation

## 2012-02-16 DIAGNOSIS — Z8619 Personal history of other infectious and parasitic diseases: Secondary | ICD-10-CM | POA: Insufficient documentation

## 2012-02-16 DIAGNOSIS — R51 Headache: Secondary | ICD-10-CM | POA: Insufficient documentation

## 2012-02-16 DIAGNOSIS — J3489 Other specified disorders of nose and nasal sinuses: Secondary | ICD-10-CM | POA: Insufficient documentation

## 2012-02-16 NOTE — ED Notes (Signed)
Patient complains of chest pain, stabbing pain, started last week and has returned today.

## 2012-02-16 NOTE — ED Provider Notes (Signed)
History     CSN: [id number]  Arrival date & time 02/16/12  1418   First MD Initiated Contact with Patient 02/16/12 1729      Chief Complaint  Patient presents with  . Fever  . Headache  . Generalized Body Aches    (Consider location/radiation/quality/duration/timing/severity/associated sxs/prior treatment) HPI  Paient with nasal congestion, chills, temp to 101.4 today and myalgias..  Patient with headache for three days but c.w. Her baseline migraines.  Patient taking benadryl at night but ran out of benadryl.    Past Medical History  Diagnosis Date  . Chest pain   . Degenerative joint disease   . Migraine   . Obesity   . Herpes simplex     Past Surgical History  Procedure Date  . Cesarean section   . Knee surgery   . Exploration post operative open heart     Family History  Problem Relation Age of Onset  . Coronary artery disease Mother 12  . Other       There is also history of diabetes, stroke, and cancer in the family    History  Substance Use Topics  . Smoking status: Never Smoker   . Smokeless tobacco: Not on file  . Alcohol Use: No    OB History    Grav Para Term Preterm Abortions TAB SAB Ect Mult Living                  Review of Systems  Constitutional: Negative for fever, chills, activity change, appetite change and unexpected weight change.  HENT: Negative for sore throat, rhinorrhea, neck pain, neck stiffness and sinus pressure.   Eyes: Negative for visual disturbance.  Respiratory: Negative for cough and shortness of breath.   Cardiovascular: Negative for chest pain and leg swelling.  Gastrointestinal: Negative for vomiting, abdominal pain, diarrhea and blood in stool.  Genitourinary: Negative for dysuria, urgency, frequency, vaginal discharge and difficulty urinating.  Musculoskeletal: Negative for myalgias, arthralgias and gait problem.  Skin: Negative for color change and rash.  Neurological: Negative for weakness, light-headedness and  headaches.  Hematological: Does not bruise/bleed easily.  Psychiatric/Behavioral: Negative for dysphoric mood.  All other systems reviewed and are negative.    Allergies  Amoxicillin; Imitrex; and Vicodin  Home Medications   Current Outpatient Rx  Name  Route  Sig  Dispense  Refill  . ASPIRIN-ACETAMINOPHEN-CAFFEINE 250-250-65 MG PO TABS   Oral   Take 2 tablets by mouth every 6 (six) hours as needed. For headache           BP 152/86  Pulse 59  Temp 98.3 F (36.8 C) (Oral)  Resp 18  SpO2 99%  Physical Exam  Nursing note and vitals reviewed. Constitutional: She is oriented to person, place, and time. She appears well-developed and well-nourished.  HENT:  Head: Normocephalic and atraumatic.  Right Ear: External ear normal.  Left Ear: External ear normal.  Nose: Nose normal.  Mouth/Throat: Oropharynx is clear and moist.  Eyes: Conjunctivae normal and EOM are normal. Pupils are equal, round, and reactive to light.  Neck: Normal range of motion. Neck supple.  Cardiovascular: Normal rate, regular rhythm, normal heart sounds and intact distal pulses.   Pulmonary/Chest: Effort normal and breath sounds normal.  Abdominal: Soft. Bowel sounds are normal.  Musculoskeletal: Normal range of motion.  Neurological: She is alert and oriented to person, place, and time. She has normal reflexes.  Skin: Skin is warm and dry.  Psychiatric: She has a normal mood  and affect. Her behavior is normal. Judgment and thought content normal.    ED Course  Procedures (including critical care time)  Labs Reviewed - No data to display No results found.   No diagnosis found.    MDM   Patient with ili and symptoms 1-3 days.  Discussed with patient risk benefits of tamiflu and patient does not want rx.        Hilario Quarry, MD 02/16/12 2108

## 2012-02-16 NOTE — ED Notes (Signed)
Pt c/o generalized HA and fever x 3 days with body aches; pt sts recent sinus infection

## 2012-06-15 ENCOUNTER — Emergency Department (HOSPITAL_COMMUNITY): Payer: Self-pay

## 2012-06-15 ENCOUNTER — Emergency Department (HOSPITAL_COMMUNITY)
Admission: EM | Admit: 2012-06-15 | Discharge: 2012-06-15 | Disposition: A | Discharge status: Home / Self Care | Payer: Self-pay | Attending: Emergency Medicine | Admitting: Emergency Medicine

## 2012-06-15 ENCOUNTER — Encounter (HOSPITAL_COMMUNITY): Payer: Self-pay | Admitting: Emergency Medicine

## 2012-06-15 DIAGNOSIS — Z8739 Personal history of other diseases of the musculoskeletal system and connective tissue: Secondary | ICD-10-CM | POA: Insufficient documentation

## 2012-06-15 DIAGNOSIS — Y92009 Unspecified place in unspecified non-institutional (private) residence as the place of occurrence of the external cause: Secondary | ICD-10-CM

## 2012-06-15 DIAGNOSIS — S8990XA Unspecified injury of unspecified lower leg, initial encounter: Secondary | ICD-10-CM | POA: Insufficient documentation

## 2012-06-15 DIAGNOSIS — IMO0002 Reserved for concepts with insufficient information to code with codable children: Secondary | ICD-10-CM | POA: Insufficient documentation

## 2012-06-15 DIAGNOSIS — S99919A Unspecified injury of unspecified ankle, initial encounter: Secondary | ICD-10-CM | POA: Insufficient documentation

## 2012-06-15 DIAGNOSIS — Z880 Allergy status to penicillin: Secondary | ICD-10-CM | POA: Insufficient documentation

## 2012-06-15 DIAGNOSIS — E669 Obesity, unspecified: Secondary | ICD-10-CM | POA: Insufficient documentation

## 2012-06-15 DIAGNOSIS — Z8679 Personal history of other diseases of the circulatory system: Secondary | ICD-10-CM | POA: Insufficient documentation

## 2012-06-15 DIAGNOSIS — Z79899 Other long term (current) drug therapy: Secondary | ICD-10-CM | POA: Insufficient documentation

## 2012-06-15 DIAGNOSIS — Y9301 Activity, walking, marching and hiking: Secondary | ICD-10-CM | POA: Insufficient documentation

## 2012-06-15 DIAGNOSIS — W010XXA Fall on same level from slipping, tripping and stumbling without subsequent striking against object, initial encounter: Secondary | ICD-10-CM | POA: Insufficient documentation

## 2012-06-15 DIAGNOSIS — M25561 Pain in right knee: Secondary | ICD-10-CM

## 2012-06-15 DIAGNOSIS — Z8619 Personal history of other infectious and parasitic diseases: Secondary | ICD-10-CM | POA: Insufficient documentation

## 2012-06-15 DIAGNOSIS — W19XXXA Unspecified fall, initial encounter: Secondary | ICD-10-CM

## 2012-06-15 DIAGNOSIS — X500XXA Overexertion from strenuous movement or load, initial encounter: Secondary | ICD-10-CM | POA: Insufficient documentation

## 2012-06-15 DIAGNOSIS — Z96659 Presence of unspecified artificial knee joint: Secondary | ICD-10-CM | POA: Insufficient documentation

## 2012-06-15 MED ORDER — OXYCODONE-ACETAMINOPHEN 5-325 MG PO TABS: 1.0000 | ORAL_TABLET | ORAL | Status: DC | PRN

## 2012-06-15 MED ORDER — OXYCODONE-ACETAMINOPHEN 5-325 MG PO TABS
2.0000 | ORAL_TABLET | Freq: Once | ORAL | Status: AC
  Administered 2012-06-15: 2 via ORAL
  Filled 2012-06-15: qty 2

## 2012-06-15 NOTE — ED Provider Notes (Signed)
  Medical screening examination/treatment/procedure(s) were performed by non-physician practitioner and as supervising physician I was immediately available for consultation/collaboration.    Vida Roller, MD 06/15/12 [phone]

## 2012-06-15 NOTE — ED Provider Notes (Signed)
History     CSN: [id number]  Arrival date & time 06/15/12  2044   First MD Initiated Contact with Patient 06/15/12 2107      Chief Complaint  Patient presents with  . Knee Pain    (Consider location/radiation/quality/duration/timing/severity/associated sxs/prior treatment) Patient is a 42 y.o. female presenting with knee pain. The history is provided by the patient and medical records. No language interpreter was used.  Knee Pain Location:  Knee Knee location:  R knee Chronicity:  New Dislocation: no   Foreign body present:  No foreign bodies Tetanus status:  Unknown Relieved by:  Nothing Worsened by:  Activity Associated symptoms: no back pain and no neck pain   Risk factors: obesity   Risk factors: no concern for non-accidental trauma     LORALYE LOBERG is a 42 y.o. female  with a hx of right knee arthroplasty (sep 2012 by Lala Lund), DJD, obesity  presents to the Emergency Department complaining of acute, persistent pain in the right knee after falling yesterday while walking on her porch.  Pt states when she fell the right leg twisted and she landed on her back, but did not hit her head or have and LOC.  Associated symptoms include swelling, gait change 2/2 pain and feeling like her leg is "dangling and maybe loose."  Pt also endorses mild pain in her back and her right ankle after the fall but they have resolved.  Pt has been taking OTC ibuprofen without relief.  Ibuprofen makes the pain only slightly better and walking and bending makes it worse.  Pt denies fever, chills, headache, neck pain, chest pain, SOB, abdominal pain, N/V/D, weakness, dizziness.  Pt denies syncope.  She     Past Medical History  Diagnosis Date  . Chest pain   . Degenerative joint disease   . Migraine   . Obesity   . Herpes simplex     Past Surgical History  Procedure Laterality Date  . Cesarean section    . Knee surgery    . Exploration post operative open heart      Family History   Problem Relation Age of Onset  . Coronary artery disease Mother 65  . Other       There is also history of diabetes, stroke, and cancer in the family    History  Substance Use Topics  . Smoking status: Never Smoker   . Smokeless tobacco: Not on file  . Alcohol Use: No    OB History   Grav Para Term Preterm Abortions TAB SAB Ect Mult Living                  Review of Systems  HENT: Negative for neck pain and neck stiffness.   Musculoskeletal: Positive for joint swelling, arthralgias and gait problem (2/2 pain). Negative for back pain.  Skin: Negative for wound.  Neurological: Negative for numbness.  All other systems reviewed and are negative.    Allergies  Amoxicillin; Imitrex; and Vicodin  Home Medications   Current Outpatient Rx  Name  Route  Sig  Dispense  Refill  . aspirin-acetaminophen-caffeine (EXCEDRIN MIGRAINE) 250-250-65 MG per tablet   Oral   Take 2 tablets by mouth every 6 (six) hours as needed. For headache         . ibuprofen (ADVIL,MOTRIN) 200 MG tablet   Oral   Take 800 mg by mouth every 6 (six) hours as needed for pain.          Marland Kitchen  loratadine (CLARITIN) 10 MG tablet   Oral   Take 10 mg by mouth daily.         Marland Kitchen oxyCODONE-acetaminophen (PERCOCET/ROXICET) 5-325 MG per tablet   Oral   Take 1 tablet by mouth every 4 (four) hours as needed for pain.   6 tablet   0     BP 131/68  Pulse 52  Temp(Src) 98.4 F (36.9 C) (Oral)  Resp 16  SpO2 98%  Physical Exam  Nursing note and vitals reviewed. Constitutional: She appears well-developed and well-nourished. No distress.  HENT:  Head: Normocephalic and atraumatic.  Eyes: Conjunctivae are normal.  Neck: Normal range of motion.  Cardiovascular: Normal rate, regular rhythm, normal heart sounds and intact distal pulses.   Capillary refill < 3 sec  Pulmonary/Chest: Effort normal and breath sounds normal. No respiratory distress. She has no wheezes.  Musculoskeletal: She exhibits  tenderness. She exhibits no edema.       Right knee: She exhibits decreased range of motion. She exhibits no ecchymosis, no deformity, no laceration, no erythema and normal alignment. Tenderness found. Medial joint line and lateral joint line tenderness noted.  ROM: mildly decreased ROM of the right knee 2/2 pain  Well healed surgical incision of the right knee  Neurological: She is alert. Coordination normal.  Sensation intact to sharp and dull Strength 4/5 decreased secondary to pain  Skin: Skin is warm and dry. She is not diaphoretic.  No tenting of the skin No abrasions or lacerations of the right knee  Psychiatric: She has a normal mood and affect.    ED Course  Procedures (including critical care time)  Labs Reviewed - No data to display Dg Knee Complete 4 Views Right  06/15/2012  *RADIOLOGY REPORT*  Clinical Data: 42 year old female status post fall with anterior right knee pain and swelling.  History of knee replacement.  RIGHT KNEE - COMPLETE 4+ VIEW  Comparison: Preoperative MRI 08/20/2010.  Findings: Sequelae of total knee arthroplasty.  Hardware components appear intact and normally aligned.  No definite joint effusion. 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 There is also a subtle bulging soft tissue contour noted at the posterior joint space on the lateral, small popliteal cyst were visible on the preoperative study, and this might be related to and increased Baker's cyst.  No acute fracture or dislocation identified.  IMPRESSION: Sequelae of total knee arthroplasty. No acute fracture or dislocation identified.  Query Baker's cyst.   Original Report Authenticated By: Erskine Speed, M.D.      1. Knee pain, acute, right   2. Fall at home, initial encounter       MDM  Anastasio Champion presents with extremity pain after fall.  Patient X-Ray negative for obvious fracture or dislocation, hardware intact. I personally reviewed the imaging tests through PACS system.   I reviewed available ER/hospitalization records through the EMR.  Pain managed in ED. Pt advised to follow up with orthopedics if symptoms persist for possibility of missed fracture diagnosis. Patient given brace while in ED, conservative therapy recommended and discussed. Patient will be dc home & is agreeable with above plan.        Dahlia Client Lavaughn Haberle, PA-C 06/15/12 2218

## 2012-06-15 NOTE — ED Notes (Signed)
Pt husband driving pt home.

## 2012-06-15 NOTE — ED Notes (Signed)
PT. TRIPPED AND FELL AT [facility] YESTERDAY , NO LOC /AMBULATORY , STATES PAIN AT RIGHT KNEE UNRELIEVED BY OTC IBUPROFEN .

## 2012-08-05 ENCOUNTER — Encounter (HOSPITAL_COMMUNITY): Payer: Self-pay | Admitting: Emergency Medicine

## 2012-08-05 ENCOUNTER — Emergency Department (INDEPENDENT_AMBULATORY_CARE_PROVIDER_SITE_OTHER)
Admission: EM | Admit: 2012-08-05 | Discharge: 2012-08-05 | Disposition: A | Discharge status: Home / Self Care | Payer: Self-pay | Source: Home / Self Care | Attending: Emergency Medicine | Admitting: Emergency Medicine

## 2012-08-05 DIAGNOSIS — B029 Zoster without complications: Secondary | ICD-10-CM

## 2012-08-05 MED ORDER — PREDNISONE 20 MG PO TABS: ORAL_TABLET | ORAL | Status: DC

## 2012-08-05 MED ORDER — ACYCLOVIR 400 MG PO TABS: 800.0000 mg | ORAL_TABLET | ORAL | Status: DC

## 2012-08-05 MED ORDER — OXYCODONE-ACETAMINOPHEN 5-325 MG PO TABS: ORAL_TABLET | ORAL | Status: DC

## 2012-08-05 NOTE — ED Notes (Signed)
Discharge and chart review

## 2012-08-05 NOTE — ED Provider Notes (Signed)
Chief Complaint:   Chief Complaint  Patient presents with  . Migraine    History of Present Illness:   Pamela [name] is a 42 year old female who has a history of what she describes as a knot on her right temple since today. This feels tender and she feels sharp pain which seems to radiate from this area back toward the parietal area and the top of the head. She cannot see any erythema, redness, swelling, or rash. This is tender to touch. She denies any fever or chills. She has a history of migraine headaches but this doesn't feel like a migraine headache. She denies any diplopia or blurred vision. She's had no neurological symptoms such as numbness, tingling, weakness, difficulty with speech or ambulation.  Review of Systems:  Other than noted above, the patient denies any of the following symptoms: Systemic:  No fever, chills, fatigue, photophobia, stiff neck. Eye:  No redness, eye pain, discharge, blurred vision, or diplopia. ENT:  No nasal congestion, rhinorrhea, sinus pressure or pain, sneezing, earache, or sore throat.  No jaw claudication. Neuro:  No paresthesias, loss of consciousness, seizure activity, muscle weakness, trouble with coordination or gait, trouble speaking or swallowing. Psych:  No depression, anxiety or trouble sleeping.  PMFSH:  Past medical history, family history, social history, meds, and allergies were reviewed.   Physical Exam:   Vital signs:  BP 163/79  Pulse 49  Temp(Src) 98 F (36.7 C) (Oral)  Resp 18  SpO2 100% General:  Alert and oriented.  In no distress. Eye:  Lids and conjunctivas normal.  PERRL,  Full EOMs.  Fundi benign with normal discs and vessels. ENT:  No cranial or facial tenderness to palpation.  TMs and canals clear.  Nasal mucosa was normal and uncongested without any drainage. No intra oral lesions, pharynx clear, mucous membranes moist, dentition normal. There is no visible or palpable knot or lump in this area. Rather, is a tender area  located just at the hairline in the right temple. There is no rash or erythema in this area. Neck:  Supple, full ROM, no tenderness to palpation.  No adenopathy or mass. Neuro:  Alert and orented times 3.  Speech was clear, fluent, and appropriate.  Cranial nerves intact. No pronator drift, muscle strength normal. Finger to nose normal.  DTRs were 2+ and symmetrical.Station and gait were normal.  Romberg's sign was normal.  Able to perform tandem gait well. Psych:  Normal affect.  Assessment:  The encounter diagnosis was Shingles.  I think this is shingles which has not yet broken out in a rash. Will treat as such and encourage her to return again if it should get worse in any way.  Plan:   1.  The following meds were prescribed:   Discharge Medication List as of 08/05/2012  5:20 PM    START taking these medications   Details  acyclovir (ZOVIRAX) 400 MG tablet Take 2 tablets (800 mg total) by mouth every 4 (four) hours while awake., Starting 08/05/2012, Until Discontinued, Normal    !! oxyCODONE-acetaminophen (PERCOCET) 5-325 MG per tablet 1 to 2 tablets every 6 hours as needed for pain., Print    predniSONE (DELTASONE) 20 MG tablet 3 daily for 3 days, 2 daily for 3 days, 1 daily for 3 days., Normal     !! - Potential duplicate medications found. Please discuss with provider.     2.  The patient was instructed in symptomatic care and handouts were given. 3.  The  patient was told to return if becoming worse in any way, if no better in 3 or 4 days, and given some red flag symptoms such as fever or changing neurological symptoms that would indicate earlier return. 4.  Follow up here as necessary.    Reuben Likes, MD 08/05/12 2114

## 2012-08-05 NOTE — ED Notes (Signed)
Pt c/o migraine headache on left side onset last night... Hx of migraines... Woke up this am feeling worse; sxs include: knot on right side of head that makes HA worse if you apply pressure, weakness, nauseas, sweating... Denies: f/v/d... Taking Advil and Excedrin w/o relief... She is alert and responsive w/no signs of acute distress.

## 2012-08-29 ENCOUNTER — Ambulatory Visit: Payer: Self-pay

## 2012-09-02 ENCOUNTER — Emergency Department (HOSPITAL_COMMUNITY): Payer: Self-pay

## 2012-09-02 ENCOUNTER — Observation Stay (HOSPITAL_COMMUNITY)
Admission: EM | Admit: 2012-09-02 | Discharge: 2012-09-04 | Disposition: A | Discharge status: Home / Self Care | Payer: MEDICAID | Attending: Family Medicine | Admitting: Family Medicine

## 2012-09-02 ENCOUNTER — Encounter (HOSPITAL_COMMUNITY): Payer: Self-pay | Admitting: *Deleted

## 2012-09-02 DIAGNOSIS — R079 Chest pain, unspecified: Principal | ICD-10-CM | POA: Insufficient documentation

## 2012-09-02 LAB — COMPREHENSIVE METABOLIC PANEL
Albumin: 4 g/dL (ref 3.5–5.2)
BUN: 18 mg/dL (ref 6–23)
Creatinine, Ser: 0.67 mg/dL (ref 0.50–1.10)
GFR calc Af Amer: >90 mL/min (ref >90)
Glucose, Bld: 156 mg/dL — ABNORMAL HIGH (ref 70–99)
Total Bilirubin: 0.2 mg/dL — ABNORMAL LOW (ref 0.3–1.2)
Total Protein: 7.2 g/dL (ref 6.0–8.3)

## 2012-09-02 LAB — CBC WITH DIFFERENTIAL/PLATELET
Basophils Relative: 0 % (ref 0–1)
Eosinophils Absolute: 0.1 10*3/uL (ref 0.0–0.7)
HCT: 35.5 % — ABNORMAL LOW (ref 36.0–46.0)
Hemoglobin: 12.3 g/dL (ref 12.0–15.0)
MCH: 27.6 pg (ref 26.0–34.0)
MCHC: 34.6 g/dL (ref 30.0–36.0)
MCV: 79.8 fL (ref 78.0–100.0)
Monocytes Absolute: 0.4 10*3/uL (ref 0.1–1.0)
Monocytes Relative: 7 % (ref 3–12)

## 2012-09-02 MED ORDER — NITROGLYCERIN 0.4 MG SL SUBL
0.4000 mg | SUBLINGUAL_TABLET | SUBLINGUAL | Status: DC | PRN
  Administered 2012-09-02 (×3): 0.4 mg via SUBLINGUAL
  Filled 2012-09-02 (×2): qty 25

## 2012-09-02 MED ORDER — ONDANSETRON HCL 4 MG/2ML IJ SOLN: 4.0000 mg | Freq: Once | INTRAMUSCULAR | Status: DC

## 2012-09-02 MED ORDER — ASPIRIN 81 MG PO CHEW
324.0000 mg | CHEWABLE_TABLET | Freq: Once | ORAL | Status: DC
  Administered 2012-09-03: 324 mg via ORAL

## 2012-09-02 MED ORDER — MORPHINE SULFATE 4 MG/ML IJ SOLN
4.0000 mg | Freq: Once | INTRAMUSCULAR | Status: DC
  Filled 2012-09-02: qty 1

## 2012-09-02 NOTE — ED Notes (Signed)
Patient states she had onset of chest pain that is described as a sharp, pressure pain that radiated to her left jaw and arm. She had diaphoresis, nausea and shortness of breath with it.

## 2012-09-02 NOTE — ED Notes (Signed)
MD at bedside. 

## 2012-09-02 NOTE — ED Notes (Signed)
"  feels better after ntg", sharp pain decreased, remains alert, NAD, calm, interactive, resps e/u, speaking in clear complete sentences, skin warm and moist.

## 2012-09-02 NOTE — ED Provider Notes (Signed)
CSN: [id number]     Arrival date & time 09/02/12  2053 History     First MD Initiated Contact with Patient 09/02/12 2303     Chief Complaint  Patient presents with  . Chest Pain  . Neck Pain  . Arm Pain  . Nausea   (Consider location/radiation/quality/duration/timing/severity/associated sxs/prior Treatment) HPI HX per PT - watching TV tonight and developed sharp and squeezing CP L sided. No SOB. Some nausea no vomiting. No leg pain or swelling. Took ASA.  Pain waxing and waning, now is dull. Pain did radiate to left neck and arm. H/o similar pain in the past. Has been admitted for the same and referred to [facility] cardiology.  Pain is now minimal.  No trauma, no rash, no cough.   FH - mother with MI age 88  Past Medical History  Diagnosis Date  . Chest pain   . Degenerative joint disease   . Migraine   . Obesity   . Herpes simplex    Past Surgical History  Procedure Laterality Date  . Cesarean section    . Knee surgery    . Exploration post operative open heart     Family History  Problem Relation Age of Onset  . Coronary artery disease Mother 74  . Other       There is also history of diabetes, stroke, and cancer in the family   History  Substance Use Topics  . Smoking status: Never Smoker   . Smokeless tobacco: Not on file  . Alcohol Use: No   OB History   Grav Para Term Preterm Abortions TAB SAB Ect Mult Living                 Review of Systems  Constitutional: Negative for fever and chills.  HENT: Negative for neck pain and neck stiffness.   Eyes: Negative for pain.  Respiratory: Negative for cough and shortness of breath.   Cardiovascular: Positive for chest pain.  Gastrointestinal: Negative for vomiting and abdominal pain.  Genitourinary: Negative for dysuria.  Musculoskeletal: Negative for back pain.  Skin: Negative for rash.  Neurological: Negative for headaches.  All other systems reviewed and are negative.    Allergies  Amoxicillin; Bee  venom; Imitrex; and Vicodin  Home Medications   Current Outpatient Rx  Name  Route  Sig  Dispense  Refill  . aspirin-acetaminophen-caffeine (EXCEDRIN MIGRAINE) 250-250-65 MG per tablet   Oral   Take 2 tablets by mouth every 6 (six) hours as needed for pain.          . DiphenhydrAMINE HCl (BENADRYL PO)   Oral   Take 1 tablet by mouth daily as needed (seasonal allergies).         Marland Kitchen ibuprofen (ADVIL,MOTRIN) 200 MG tablet   Oral   Take 800 mg by mouth every 6 (six) hours as needed for pain.           BP 156/73  Pulse 52  Temp(Src) 98.5 F (36.9 C) (Oral)  Resp 20  SpO2 99% Physical Exam  Constitutional: She is oriented to person, place, and time. She appears well-developed and well-nourished.  HENT:  Head: Normocephalic and atraumatic.  Eyes: Conjunctivae and EOM are normal. Pupils are equal, round, and reactive to light.  Neck: Trachea normal. Neck supple. No thyromegaly present.  Cardiovascular: Normal rate, regular rhythm, S1 normal, S2 normal and normal pulses.     No systolic murmur is present   No diastolic murmur is present  Pulses:  Radial pulses are 2+ on the right side, and 2+ on the left side.  Pulmonary/Chest: Effort normal and breath sounds normal. She has no wheezes. She has no rhonchi. She has no rales. She exhibits no tenderness.  Abdominal: Soft. Normal appearance and bowel sounds are normal. There is no tenderness. There is no CVA tenderness and negative Murphy's sign.  Musculoskeletal:  calves nontender, no cords or erythema, negative Homans sign  Neurological: She is alert and oriented to person, place, and time. She has normal strength. No cranial nerve deficit or sensory deficit. GCS eye subscore is 4. GCS verbal subscore is 5. GCS motor subscore is 6.  Skin: Skin is warm and dry. No rash noted. She is not diaphoretic.  Psychiatric: Her speech is normal.  Cooperative and appropriate    ED Course   Procedures (including critical care  time)  Labs Reviewed  CBC WITH DIFFERENTIAL - Abnormal; Notable for the following:    HCT 35.5 (*)    All other components within normal limits  COMPREHENSIVE METABOLIC PANEL - Abnormal; Notable for the following:    Glucose, Bld 156 (*)    Total Bilirubin 0.2 (*)    All other components within normal limits  POCT I-STAT TROPONIN I   Dg Chest 2 View  09/02/2012   *RADIOLOGY REPORT*  Clinical Data: Chest pain  CHEST - 2 VIEW  Comparison: 11/05/2011  Findings: Lungs are clear. No pleural effusion or pneumothorax.  The heart is top normal in size.  Visualized osseous structures are within normal limits.  IMPRESSION: No evidence of acute cardiopulmonary disease.   Original Report Authenticated By: Charline Bills, M.D.    Date: 09/02/2012  Rate: 51  Rhythm: normal sinus rhythm  QRS Axis: normal  Intervals: normal  ST/T Wave abnormalities: nonspecific ST changes  Conduction Disutrbances:none  Narrative Interpretation:   Old EKG Reviewed: unchanged  IV morphine ASA PTA NTG in the ED - CP improved MED consult 11:54 PM d/w DR Toniann Fail - plan admit MDM  CP low risk for ACS  ECG unchanged Labs, CXR  IV narcotics, NTG  MED admit    Sunnie Nielsen, MD 09/02/12 2355

## 2012-09-02 NOTE — ED Notes (Addendum)
Mother died of MI at 58, c/o sudden onset of CP, L arm and neck pain while watching TV tonight around 28. Took ASA 325mg  PTA. Also took ibuprofen 800mg  at 2030. Also nausea. Denies other sx.

## 2012-09-03 ENCOUNTER — Encounter (HOSPITAL_COMMUNITY): Payer: Self-pay | Admitting: Internal Medicine

## 2012-09-03 ENCOUNTER — Observation Stay (HOSPITAL_COMMUNITY): Payer: Self-pay

## 2012-09-03 DIAGNOSIS — R079 Chest pain, unspecified: Secondary | ICD-10-CM

## 2012-09-03 LAB — CBC WITH DIFFERENTIAL/PLATELET
Basophils Relative: 0 % (ref 0–1)
Hemoglobin: 11.6 g/dL — ABNORMAL LOW (ref 12.0–15.0)
Lymphocytes Relative: 41 % (ref 12–46)
MCHC: 34.1 g/dL (ref 30.0–36.0)
Monocytes Relative: 8 % (ref 3–12)
Neutro Abs: 2.9 10*3/uL (ref 1.7–7.7)
Neutrophils Relative %: 49 % (ref 43–77)
RBC: 4.25 MIL/uL (ref 3.87–5.11)
WBC: 6 10*3/uL (ref 4.0–10.5)

## 2012-09-03 LAB — COMPREHENSIVE METABOLIC PANEL
Alkaline Phosphatase: 91 U/L (ref 39–117)
BUN: 18 mg/dL (ref 6–23)
Calcium: 9.5 mg/dL (ref 8.4–10.5)
Creatinine, Ser: 0.63 mg/dL (ref 0.50–1.10)
GFR calc Af Amer: >90 mL/min (ref >90)
Glucose, Bld: 132 mg/dL — ABNORMAL HIGH (ref 70–99)
Total Protein: 6.8 g/dL (ref 6.0–8.3)

## 2012-09-03 LAB — TROPONIN I
Troponin I: <0.3 ng/mL (ref <0.30)
Troponin I: <0.3 ng/mL (ref <0.30)

## 2012-09-03 LAB — PREGNANCY, URINE: Preg Test, Ur: NEGATIVE

## 2012-09-03 MED ORDER — ASPIRIN EC 325 MG PO TBEC
325.0000 mg | DELAYED_RELEASE_TABLET | Freq: Every day | ORAL | Status: DC
  Administered 2012-09-03 – 2012-09-04 (×2): 325 mg via ORAL
  Filled 2012-09-03 (×3): qty 1

## 2012-09-03 MED ORDER — ONDANSETRON HCL 4 MG PO TABS: 4.0000 mg | ORAL_TABLET | Freq: Four times a day (QID) | ORAL | Status: DC | PRN

## 2012-09-03 MED ORDER — TRAMADOL HCL 50 MG PO TABS
50.0000 mg | ORAL_TABLET | Freq: Four times a day (QID) | ORAL | Status: DC | PRN
  Administered 2012-09-03: 50 mg via ORAL
  Filled 2012-09-03: qty 1

## 2012-09-03 MED ORDER — SODIUM CHLORIDE 0.9 % IJ SOLN
3.0000 mL | Freq: Two times a day (BID) | INTRAMUSCULAR | Status: DC
  Administered 2012-09-03 – 2012-09-04 (×2): 3 mL via INTRAVENOUS

## 2012-09-03 MED ORDER — SODIUM CHLORIDE 0.9 % IJ SOLN
3.0000 mL | Freq: Two times a day (BID) | INTRAMUSCULAR | Status: DC
  Administered 2012-09-03 (×3): 3 mL via INTRAVENOUS

## 2012-09-03 MED ORDER — IOHEXOL 350 MG/ML SOLN
65.0000 mL | Freq: Once | INTRAVENOUS | Status: AC | PRN
  Administered 2012-09-03: 65 mL via INTRAVENOUS

## 2012-09-03 MED ORDER — TRAMADOL HCL 50 MG PO TABS
50.0000 mg | ORAL_TABLET | Freq: Once | ORAL | Status: AC
  Administered 2012-09-03: 50 mg via ORAL
  Filled 2012-09-03: qty 1

## 2012-09-03 MED ORDER — ACETAMINOPHEN 650 MG RE SUPP: 650.0000 mg | Freq: Four times a day (QID) | RECTAL | Status: DC | PRN

## 2012-09-03 MED ORDER — ENOXAPARIN SODIUM 40 MG/0.4ML ~~LOC~~ SOLN
40.0000 mg | Freq: Every day | SUBCUTANEOUS | Status: DC
  Administered 2012-09-03 – 2012-09-04 (×2): 40 mg via SUBCUTANEOUS
  Filled 2012-09-03 (×2): qty 0.4

## 2012-09-03 MED ORDER — NAPROXEN 500 MG PO TABS
500.0000 mg | ORAL_TABLET | Freq: Two times a day (BID) | ORAL | Status: DC | PRN
  Administered 2012-09-03: 500 mg via ORAL
  Filled 2012-09-03: qty 1

## 2012-09-03 MED ORDER — ONDANSETRON HCL 4 MG/2ML IJ SOLN: 4.0000 mg | Freq: Four times a day (QID) | INTRAMUSCULAR | Status: DC | PRN

## 2012-09-03 MED ORDER — ACETAMINOPHEN 325 MG PO TABS
650.0000 mg | ORAL_TABLET | Freq: Four times a day (QID) | ORAL | Status: DC | PRN
  Administered 2012-09-03 (×2): 650 mg via ORAL
  Filled 2012-09-03 (×2): qty 2

## 2012-09-03 MED ORDER — IOHEXOL 350 MG/ML SOLN
80.0000 mL | Freq: Once | INTRAVENOUS | Status: AC | PRN
  Administered 2012-09-03: 90 mL via INTRAVENOUS

## 2012-09-03 NOTE — Progress Notes (Signed)
Utilization Review Completed.Pamela [name] T7/26/2014

## 2012-09-03 NOTE — ED Notes (Signed)
Report given to floor nurse and pt transported to the floor with tech and monitor. Pt alert and in NAD at time of admission

## 2012-09-03 NOTE — ED Notes (Signed)
Medicine at bedside.

## 2012-09-03 NOTE — Progress Notes (Signed)
Patient was admitted earlier this AM by my associate. Please refer to his H and P for details regarding assessment and plan.  CT angiogram of chest 2ary to elevated d dimer was negative.  Will reassess next am.  Pamela [name]

## 2012-09-03 NOTE — Progress Notes (Signed)
Pt's d.dimer 0.94, Lenny Pastel, NP paged and ordered stat CT.

## 2012-09-03 NOTE — H&P (Signed)
[facility] History and Physical  Pamela [name]:[id number] DOB: September 09, 1970 DOA: 09/02/2012  Referring physician: ER physician. PCP: No PCP Per Patient   Chief Complaint: Chest pain.  HPI: Pamela [name] is a 42 y.o. female with history of migraine started having chest pain last evening while at home. Pain was left-sided anterior chest wall nonradiating. No associated shortness of breath or diaphoresis. Denies any fever chills or any productive cough. Patient's chest pain improved with nitroglycerin sublingual and at this time patient is chest pain-free. Chest x-ray cardiac enzymes and EKG were unremarkable and has been admitted for further observation. Patient had similar episode 2 years ago and had followed up with [place] the details of which are not known. Patient otherwise denies any nausea vomiting abdominal pain diarrhea dizziness or loss of consciousness.  Review of Systems: As presented in the history of presenting illness, rest negative.  Past Medical History  Diagnosis Date  . Chest pain   . Degenerative joint disease   . Migraine   . Obesity   . Herpes simplex    Past Surgical History  Procedure Laterality Date  . Cesarean section    . Knee surgery    . Exploration post operative open heart     Social History:  reports that she has never smoked. She does not have any smokeless tobacco history on file. She reports that she does not drink alcohol or use illicit drugs. Home. where does patient live-- Can do ADLs. Can patient participate in ADLs?  Allergies  Allergen Reactions  . Amoxicillin Nausea Only  . Bee Venom   . Imitrex (Sumatriptan) Other (See Comments)    Gives chest pains  . Vicodin (Hydrocodone-Acetaminophen) Other (See Comments)    Migraines    Family History  Problem Relation Age of Onset  . Coronary artery disease Mother 19  . Other       There is also history of diabetes, stroke, and cancer in the family      Prior to Admission  medications   Medication Sig Start Date End Date Taking? Authorizing Provider  aspirin-acetaminophen-caffeine (EXCEDRIN MIGRAINE) [phone] MG per tablet Take 2 tablets by mouth every 6 (six) hours as needed for pain.    Yes Historical Provider, MD  DiphenhydrAMINE HCl (BENADRYL PO) Take 1 tablet by mouth daily as needed (seasonal allergies).   Yes Historical Provider, MD  ibuprofen (ADVIL,MOTRIN) 200 MG tablet Take 800 mg by mouth every 6 (six) hours as needed for pain.    Yes Historical Provider, MD   Physical Exam: Filed Vitals:   09/02/12 2330 09/02/12 2345 09/03/12 0015 09/03/12 0030  BP: 142/70 122/64 124/65 126/59  Pulse: 51 50 47 48  Temp:      TempSrc:      Resp: 18 22 25    SpO2: 99% 100% 98% 98%     General:  Well-developed and nourished.  Eyes: Anicteric no pallor.  ENT: No discharge from the ears eyes nose mouth.  Neck: No mass felt.  Cardiovascular: S1-S2 heard.  Respiratory: No rhonchi or crepitations.  Abdomen: Soft nontender bowel sounds present.  Skin: No rash.  Musculoskeletal: No edema.  Psychiatric: Appears normal.  Neurologic: Alert awake oriented to time place and person. Moves all extremities.  Labs on Admission:  Basic Metabolic Panel:  Recent Labs Lab 09/02/12 2132  NA 139  K 4.1  CL 104  CO2 24  GLUCOSE 156*  BUN 18  CREATININE 0.67  CALCIUM 9.4   Liver Function Tests:  Recent Labs Lab 09/02/12 2132  AST 25  ALT 21  ALKPHOS 98  BILITOT 0.2*  PROT 7.2  ALBUMIN 4.0   No results found for this basename: LIPASE, AMYLASE,  in the last 168 hours No results found for this basename: AMMONIA,  in the last 168 hours CBC:  Recent Labs Lab 09/02/12 2132  WBC 6.0  NEUTROABS 3.2  HGB 12.3  HCT 35.5*  MCV 79.8  PLT 210   Cardiac Enzymes: No results found for this basename: CKTOTAL, CKMB, CKMBINDEX, TROPONINI,  in the last 168 hours  BNP (last 3 results) No results found for this basename: PROBNP,  in the last 8760  hours CBG: No results found for this basename: GLUCAP,  in the last 168 hours  Radiological Exams on Admission: Dg Chest 2 View  09/02/2012   *RADIOLOGY REPORT*  Clinical Data: Chest pain  CHEST - 2 VIEW  Comparison: 11/05/2011  Findings: Lungs are clear. No pleural effusion or pneumothorax.  The heart is top normal in size.  Visualized osseous structures are within normal limits.  IMPRESSION: No evidence of acute cardiopulmonary disease.   Original Report Authenticated By: Charline Bills, M.D.    EKG: Independently reviewed. Normal sinus rhythm.  Assessment/Plan Principal Problem:   Chest pain   1. Chest pain - rule out ACS. Cycle cardiac markers. Aspirin. When necessary nitroglycerin. Check d-dimer. 2. History of migraine - denies any headache at this time.    Code Status: Full code.  Family Communication: Patient's husband at the bedside.  Disposition Plan: Admit for observation.    Pamela [name] N. [facility] Pager [phone].  If 7PM-7AM, please contact night-coverage [email or web address] Password [facility] 09/03/2012, 12:40 AM

## 2012-09-04 NOTE — Progress Notes (Signed)
Discharge instructions given.  No questions asked.  Left via walking to friend picking her up at [facility] entrance. [place]

## 2012-09-04 NOTE — Progress Notes (Signed)
Pt's heart rate decreased to 39-40 nonsustained while sleeping.  Pt awakened and denied complaints. HR spontaneously up to 58 when awakened.   Pt stated she does snore at home when she is sleeping and often feels tired throughout the day.  Pt denied history of known sleep apnea.  Will continue to monitor pt closely.

## 2012-09-04 NOTE — Discharge Summary (Signed)
Physician Discharge Summary  Pamela [name] UJW:[id number] DOB: 1970-03-06 DOA: 09/02/2012  PCP: No PCP Per Patient  Admit date: 09/02/2012 Discharge date: 09/04/2012  Time spent: > 35 minutes   Recommendations for Outpatient Follow-up:  1. Please continue work up for chest discomfort not felt to be cardiac in origin based on negative work up at hospital. 2. Also consider work up for OSA  Discharge Diagnoses:  Principal Problem:   Chest pain   Discharge Condition: stable: chest pain fee  Diet recommendation: Low sodium diet  Filed Weights   09/03/12 0101  Weight: 113.399 kg (250 lb)    History of present illness:  From original HPI: Pamela [name] is a 42 y.o. female with history of migraine started having chest pain last evening while at home. Pain was left-sided anterior chest wall nonradiating. No associated shortness of breath or diaphoresis. Denies any fever chills or any productive cough. Patient's chest pain improved with nitroglycerin sublingual and at this time patient is chest pain-free. Chest x-ray cardiac enzymes and EKG were unremarkable and has been admitted for further observation. Patient had similar episode 2 years ago and had followed up with [place] the details of which are not known. Patient otherwise denies any nausea vomiting abdominal pain diarrhea dizziness or loss of consciousness.  Hospital Course:   Chest pain - Troponins x 6 negative - chest pain free at time of discharge - D dimer elevated initially but CT angiogram of chest reported as no evidence of significant pulmonary embolus with no evidence of active pulmonary disease - Recommend patient f/u with PCP for further work up  - EKG WNL with no ST elevation or depressions  Procedures:  None  Consultations:  none  Discharge Exam: Filed Vitals:   09/03/12 0600 09/03/12 1348 09/03/12 2100 09/04/12 0500  BP: 134/64 132/68 137/50 116/57  Pulse: 46 48 48 62  Temp: 97.8 F (36.6 C) 98.1 F  (36.7 C) 97.5 F (36.4 C) 98 F (36.7 C)  TempSrc:  Oral    Resp: 18 16 20 18   Height:      Weight:      SpO2: 98% 100% 100% 99%    General: Pt in NAD, ALert and awake Cardiovascular: RRR, no MRG Respiratory: CTA BL, no wheezes  Discharge Instructions  Discharge Orders   Future Orders Complete By Expires     Call MD for:  difficulty breathing, headache or visual disturbances  As directed     Call MD for:  temperature >100.4  As directed     Diet - low sodium heart healthy  As directed     Discharge instructions  As directed     Comments:      Please be sure to follow up with your primary care physician in 1-2 weeks or sooner should any new concerns arise.    Increase activity slowly  As directed         Medication List         aspirin-acetaminophen-caffeine 250-250-65 MG per tablet  Commonly known as:  EXCEDRIN MIGRAINE  Take 2 tablets by mouth every 6 (six) hours as needed for pain.     BENADRYL PO  Take 1 tablet by mouth daily as needed (seasonal allergies).     ibuprofen 200 MG tablet  Commonly known as:  ADVIL,MOTRIN  Take 800 mg by mouth every 6 (six) hours as needed for pain.       Allergies  Allergen Reactions  . Amoxicillin Nausea Only  .  Bee Venom   . Imitrex (Sumatriptan) Other (See Comments)    Gives chest pains  . Vicodin (Hydrocodone-Acetaminophen) Other (See Comments)    Migraines      The results of significant diagnostics from this hospitalization (including imaging, microbiology, ancillary and laboratory) are listed below for reference.    Significant Diagnostic Studies: Dg Chest 2 View  09/02/2012   *RADIOLOGY REPORT*  Clinical Data: Chest pain  CHEST - 2 VIEW  Comparison: 11/05/2011  Findings: Lungs are clear. No pleural effusion or pneumothorax.  The heart is top normal in size.  Visualized osseous structures are within normal limits.  IMPRESSION: No evidence of acute cardiopulmonary disease.   Original Report Authenticated By: Charline Bills, M.D.   Ct Angio Chest Pe W/cm &/or Wo Cm  09/03/2012   *RADIOLOGY REPORT*  Clinical Data: Left-sided anterior chest wall nonradiating pain. Pain improved with nitroglycerin.  Elevated D-dimer.  CT ANGIOGRAPHY CHEST  Technique:  Multidetector CT imaging of the chest using the standard protocol during bolus administration of intravenous contrast. Multiplanar reconstructed images including MIPs were obtained and reviewed to evaluate the vascular anatomy.  Contrast: 90mL OMNIPAQUE IOHEXOL 350 MG/ML SOLN, 65mL OMNIPAQUE IOHEXOL 350 MG/ML SOLN  I was called to evaluate the patient at about 0530 hours on 09/03/2012 for complaint of contrast extravasation.  Estimate of 70 ml nonionic contrast material extravasated into the left antecubital fossa.  The patient complained of pain but the no acute distress. The left arm was elevated and an ice pack was in position.  Physical examination demonstrated swelling and redness in the antecubital fossa, extending above and below the elbow laterally.  The IV has been removed.  Distal pulses and grip strength were intact.  Normal capillary reflux. Potential sequelae of contrast extravasation was discussed with the patient.  Care plan for continued observation and conservative management was discussed.  Plan acute be elevated as much as possible with application of ice packs three times a day for the next 3 days R until swelling subsides.  Site will be monitored and physician will be notified if the patient develops any change in sensation, discoloration, blistering, or increasing pain or loss of strength.  Comparison: 05/23/2008  Findings: Technically adequate study with good opacification of the central and segmental pulmonary arteries.  No focal filling defects are identified.  No evidence of significant pulmonary embolus.  Normal heart size.  Normal caliber thoracic aorta.  Esophagus is decompressed.  No significant lymphadenopathy in the chest.  No pleural effusions.   Visualized portions of upper abdominal organs are grossly unremarkable.  No focal consolidation or airspace disease in the lungs.  No pneumothorax.  Airways appear patent.  IMPRESSION: No evidence of significant pulmonary embolus.  No evidence of active pulmonary disease.  Examination was complicated by extravasation of approximately 70 ml nonionic contrast material in the left antecubital fossa.  Conservative management is initiated.   Original Report Authenticated By: Burman Nieves, M.D.    Microbiology: No results found for this or any previous visit (from the past 240 hour(s)).   Labs: Basic Metabolic Panel:  Recent Labs Lab 09/02/12 2132 09/03/12 0200  NA 139 140  K 4.1 3.9  CL 104 105  CO2 24 24  GLUCOSE 156* 132*  BUN 18 18  CREATININE 0.67 0.63  CALCIUM 9.4 9.5   Liver Function Tests:  Recent Labs Lab 09/02/12 2132 09/03/12 0200  AST 25 24  ALT 21 22  ALKPHOS 98 91  BILITOT 0.2* 0.2*  PROT  7.2 6.8  ALBUMIN 4.0 3.7   No results found for this basename: LIPASE, AMYLASE,  in the last 168 hours No results found for this basename: AMMONIA,  in the last 168 hours CBC:  Recent Labs Lab 09/02/12 2132 09/03/12 0200  WBC 6.0 6.0  NEUTROABS 3.2 2.9  HGB 12.3 11.6*  HCT 35.5* 34.0*  MCV 79.8 80.0  PLT 210 196   Cardiac Enzymes:  Recent Labs Lab 09/03/12 0200 09/03/12 0930 09/03/12 1608 09/03/12 2230 09/04/12 0510  TROPONINI <0.30 <0.30 <0.30 <0.30 <0.30   BNP: BNP (last 3 results) No results found for this basename: PROBNP,  in the last 8760 hours CBG: No results found for this basename: GLUCAP,  in the last 168 hours     Signed:  Penny Pia  [facility] 09/04/2012, 12:47 PM

## 2012-09-13 ENCOUNTER — Ambulatory Visit: Payer: Self-pay

## 2012-09-29 ENCOUNTER — Ambulatory Visit: Payer: Self-pay | Attending: Family Medicine | Admitting: Internal Medicine

## 2012-09-29 VITALS — BP 122/74 | HR 55 | Temp 98.4°F | Resp 18 | Ht 63.39 in | Wt 257.4 lb

## 2012-09-29 DIAGNOSIS — R632 Polyphagia: Secondary | ICD-10-CM | POA: Insufficient documentation

## 2012-09-29 DIAGNOSIS — Z01419 Encounter for gynecological examination (general) (routine) without abnormal findings: Secondary | ICD-10-CM | POA: Insufficient documentation

## 2012-09-29 DIAGNOSIS — Z124 Encounter for screening for malignant neoplasm of cervix: Secondary | ICD-10-CM | POA: Insufficient documentation

## 2012-09-29 LAB — CBC WITH DIFFERENTIAL/PLATELET
Basophils Absolute: 0 10*3/uL (ref 0.0–0.1)
Eosinophils Absolute: 0.1 10*3/uL (ref 0.0–0.7)
Eosinophils Relative: 2 % (ref 0–5)
Lymphocytes Relative: 33 % (ref 12–46)
MCH: 26.8 pg (ref 26.0–34.0)
MCV: 77.7 fL — ABNORMAL LOW (ref 78.0–100.0)
Platelets: 219 10*3/uL (ref 150–400)
RDW: 14.7 % (ref 11.5–15.5)
WBC: 6.7 10*3/uL (ref 4.0–10.5)

## 2012-09-29 LAB — LIPID PANEL
Cholesterol: 156 mg/dL (ref 0–200)
HDL: 37 mg/dL — ABNORMAL LOW (ref >39)
Triglycerides: 440 mg/dL — ABNORMAL HIGH (ref <150)

## 2012-09-29 LAB — COMPLETE METABOLIC PANEL WITH GFR
ALT: 25 U/L (ref 0–35)
AST: 29 U/L (ref 0–37)
Alkaline Phosphatase: 92 U/L (ref 39–117)
Creat: 0.54 mg/dL (ref 0.50–1.10)
Total Bilirubin: 0.3 mg/dL (ref 0.3–1.2)

## 2012-09-29 NOTE — Progress Notes (Signed)
Pt is here to establish care and for a 42 y/o PE w/pap C/o intermittent CP... 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

## 2012-09-29 NOTE — Progress Notes (Signed)
Patient ID: Pamela [name], female   DOB: 1970/11/20, 42 y.o.   MRN: [id number]  CC: Establish care  HPI: Patient is a 42 years old pleasant woman seen in the clinic today to establish care. She has no significant complaint. She wants to have Pap smear done a general physical. She has no significant past medical history, she's not on chronic medication except for over-the-counter pain medication for headache occasionally. A grandmother has breast cancer, her mother had MI at age of 80. No family history of diabetes. She was in the ER recently for chest pain, myocardial infarction ruled out, she was discharged with diagnoses of atypical chest pain. She does not smoke cigarette, she does not drink alcohol. She has gained weight lately, she used to be 190 pounds, now 257 pounds. He also reports that she has been eating a lot lately, and 1 hour after a meal she'll feel as though she has not eaten for long and she will eat again. She has also passed urine more frequent and plenty at a time. She is sexually active with husband, has 4 children between ages 63 and 68.  Allergies  Allergen Reactions  . Amoxicillin Nausea Only  . Bee Venom   . Imitrex [Sumatriptan] Other (See Comments)    Gives chest pains  . Vicodin [Hydrocodone-Acetaminophen] Other (See Comments)    Migraines   Past Medical History  Diagnosis Date  . Chest pain   . Degenerative joint disease   . Migraine   . Obesity   . Herpes simplex    Current Outpatient Prescriptions on File Prior to Visit  Medication Sig Dispense Refill  . aspirin-acetaminophen-caffeine (EXCEDRIN MIGRAINE) 250-250-65 MG per tablet Take 2 tablets by mouth every 6 (six) hours as needed for pain.       . DiphenhydrAMINE HCl (BENADRYL PO) Take 1 tablet by mouth daily as needed (seasonal allergies).      Marland Kitchen ibuprofen (ADVIL,MOTRIN) 200 MG tablet Take 800 mg by mouth every 6 (six) hours as needed for pain.        No current facility-administered medications on file  prior to visit.   Family History  Problem Relation Age of Onset  . Coronary artery disease Mother 12  . Other       There is also history of diabetes, stroke, and cancer in the family   History   Social History  . Marital Status: Married    Spouse Name: N/A    Number of Children: 2  . Years of Education: N/A   Occupational History  .     Social History Main Topics  . Smoking status: Never Smoker   . Smokeless tobacco: Not on file  . Alcohol Use: No  . Drug Use: No  . Sexual Activity: Yes    Birth Control/ Protection: None   Other Topics Concern  . Not on file   Social History Narrative  . No narrative on file    Review of Systems: Constitutional: Negative for fever, chills, diaphoresis, activity change, appetite change and fatigue. HENT: Negative for ear pain, nosebleeds, congestion, facial swelling, rhinorrhea, neck pain, neck stiffness and ear discharge.  Eyes: Negative for pain, discharge, redness, itching and visual disturbance. Respiratory: Negative for cough, choking, chest tightness, shortness of breath, wheezing and stridor.  Cardiovascular: Negative for chest pain, palpitations and leg swelling. Gastrointestinal: Negative for abdominal distention. Genitourinary: Negative for dysuria, urgency, frequency, hematuria, flank pain, decreased urine volume, difficulty urinating and dyspareunia.  Musculoskeletal: Negative for back  pain, joint swelling, arthralgias and gait problem. Neurological: Negative for dizziness, tremors, seizures, syncope, facial asymmetry, speech difficulty, weakness, light-headedness, numbness and headaches.  Hematological: Negative for adenopathy. Does not bruise/bleed easily. Psychiatric/Behavioral: Negative for hallucinations, behavioral problems, confusion, dysphoric mood, decreased concentration and agitation.    Objective:   Filed Vitals:   09/29/12 1418  BP: 122/74  Pulse: 55  Temp: 98.4 F (36.9 C)  Resp: 18    Physical  Exam: Constitutional: Patient appears well-developed and well-nourished. No distress. HENT: Normocephalic, atraumatic, External right and left ear normal. Oropharynx is clear and moist.  Eyes: Conjunctivae and EOM are normal. PERRLA, no scleral icterus. Neck: Normal ROM. Neck supple. No JVD. No tracheal deviation. No thyromegaly. CVS: RRR, S1/S2 +, no murmurs, no gallops, no carotid bruit.  Pulmonary: Effort and breath sounds normal, no stridor, rhonchi, wheezes, rales.  Abdominal: Soft. BS +,  no distension, tenderness, rebound or guarding.  Musculoskeletal: Normal range of motion. No edema and no tenderness.  Lymphadenopathy: No lymphadenopathy noted, cervical, inguinal or axillary Neuro: Alert. Normal reflexes, muscle tone coordination. No cranial nerve deficit. Skin: Skin is warm and dry. No rash noted. Not diaphoretic. No erythema. No pallor. Psychiatric: Normal mood and affect. Behavior, judgment, thought content normal. Pelvic Exam: Normal external genitalia, Deep and rotated cervix, no cervical motion tenderness  Lab Results  Component Value Date   WBC 6.0 09/03/2012   HGB 11.6* 09/03/2012   HCT 34.0* 09/03/2012   MCV 80.0 09/03/2012   PLT 196 09/03/2012   Lab Results  Component Value Date   CREATININE 0.63 09/03/2012   BUN 18 09/03/2012   NA 140 09/03/2012   K 3.9 09/03/2012   CL 105 09/03/2012   CO2 24 09/03/2012    No results found for this basename: HGBA1C   Lipid Panel     Component Value Date/Time   CHOL 148 06/29/2010 0713   TRIG 74 06/29/2010 0713   HDL 46 06/29/2010 0713   CHOLHDL 3.2 06/29/2010 0713   VLDL 15 06/29/2010 0713   LDLCALC  Value: 87        Total Cholesterol/HDL:CHD Risk Coronary Heart Disease Risk Table                     Men   Women  1/2 Average Risk   3.4   3.3  Average Risk       5.0   4.4  2 X Average Risk   9.6   7.1  3 X Average Risk  23.4   11.0        Use the calculated Patient Ratio above and the CHD Risk Table to determine the patient's CHD Risk.         ATP III CLASSIFICATION (LDL):  <100     mg/dL   Optimal  010-272  mg/dL   Near or Above                    Optimal  130-159  mg/dL   Borderline  536-644  mg/dL   High  >034     mg/dL   Very High [id number]       Assessment and plan:   Patient Active Problem List   Diagnosis Date Noted  . Pap smear for cervical cancer screening 09/29/2012  . Morbid obesity 09/29/2012  . Polyphagia 09/29/2012  . Chest pain 09/03/2012   Pap smear done today, which is difficulty, cervix was rotated and buried deep, not sure of  the specimen, patient informed, may need to repeat GC chlamydia, wet mount     Other labs: CBC D. CMP Lipid panel TSH and free T4 Hemoglobin A1c Complete urinalysis  Mammogram in view of patient's family history and age of 41  Nutrition and exercise counseling Form filled for patient's medical report for job  LISANDRA MATHISEN was given clear instructions to go to ER or return to the clinic if symptoms don't improve, worsen or new problems develop.  Anastasio Champion verbalized understanding.  CLEDA IMEL was told to call to get lab results if hasn't heard anything in the next week.    Jeanann Lewandowsky, MD [facility] [place], [place] [phone]   09/29/2012, 3:18 PM

## 2012-09-30 LAB — URINALYSIS, COMPLETE
Bacteria, UA: NONE SEEN
Bilirubin Urine: NEGATIVE
Glucose, UA: NEGATIVE mg/dL
Hgb urine dipstick: NEGATIVE
Protein, ur: 30 mg/dL — AB
Urobilinogen, UA: 0.2 mg/dL (ref 0.0–1.0)

## 2012-09-30 NOTE — Addendum Note (Signed)
Addended by: Quentin Angst on: 09/30/2012 04:53 PM   Modules accepted: Orders

## 2012-09-30 NOTE — Addendum Note (Signed)
Addended by: Jeanann Lewandowsky E on: 09/30/2012 11:45 AM   Modules accepted: Orders

## 2012-10-05 ENCOUNTER — Telehealth: Payer: Self-pay

## 2012-10-05 NOTE — Telephone Encounter (Signed)
Left message to return our call so we can make an appt.

## 2012-10-05 NOTE — Telephone Encounter (Signed)
Message copied by Lestine Mount on Wed Oct 05, 2012  1:42 PM ------      Message from: Jeanann Lewandowsky E      Created: Wed Oct 05, 2012 11:57 AM       Please call patient to schedule an appointment as soon as possible to discuss her lab results ------

## 2012-10-06 ENCOUNTER — Telehealth: Payer: Self-pay | Admitting: Family Medicine

## 2012-10-06 NOTE — Telephone Encounter (Signed)
Pt calling back

## 2012-10-13 ENCOUNTER — Ambulatory Visit: Payer: Self-pay

## 2012-10-14 ENCOUNTER — Ambulatory Visit: Payer: Self-pay | Attending: Internal Medicine | Admitting: Internal Medicine

## 2012-10-14 ENCOUNTER — Encounter: Payer: Self-pay | Admitting: Internal Medicine

## 2012-10-14 VITALS — BP 154/89 | HR 51 | Temp 97.9°F | Resp 14 | Ht 64.17 in | Wt 256.0 lb

## 2012-10-14 DIAGNOSIS — E785 Hyperlipidemia, unspecified: Secondary | ICD-10-CM | POA: Insufficient documentation

## 2012-10-14 DIAGNOSIS — E781 Pure hyperglyceridemia: Secondary | ICD-10-CM | POA: Insufficient documentation

## 2012-10-14 DIAGNOSIS — R5381 Other malaise: Secondary | ICD-10-CM

## 2012-10-14 DIAGNOSIS — Z09 Encounter for follow-up examination after completed treatment for conditions other than malignant neoplasm: Secondary | ICD-10-CM | POA: Insufficient documentation

## 2012-10-14 DIAGNOSIS — E669 Obesity, unspecified: Secondary | ICD-10-CM | POA: Insufficient documentation

## 2012-10-14 DIAGNOSIS — R7309 Other abnormal glucose: Secondary | ICD-10-CM | POA: Insufficient documentation

## 2012-10-14 LAB — POCT GLYCOSYLATED HEMOGLOBIN (HGB A1C): Hemoglobin A1C: 5.6

## 2012-10-14 NOTE — Progress Notes (Signed)
Patient ID: Pamela [name], female   DOB: [birth year], 42 y.o.   MRN: [id number] PCP:  Standley Dakins, MD   DOA:  No admission date for patient encounter.  Chief Complaint:  Followup on labs  HPI: 42 year old obese female who was recently seen in the clinic for a followup here to discuss her lab results. Patient had CBC, comprehensive metabolic panel and lipid panel ordered it showed hypertriglyceridemia. The patient reports feeling tired and also urinating a lot. Denies any fever, chills, headache, blurred vision, dizziness, abdominal pain, chest pain, palpitations, shortness of breath, nausea, vomiting, bowel symptoms.   Allergies: Allergies  Allergen Reactions  . Amoxicillin Nausea Only  . Bee Venom   . Imitrex [Sumatriptan] Other (See Comments)    Gives chest pains  . Vicodin [Hydrocodone-Acetaminophen] Other (See Comments)    Migraines    Prior to Admission medications   Medication Sig Start Date End Date Taking? Authorizing Provider  Cyanocobalamin (VITAMIN B 12 PO) Take by mouth.   Yes Historical Provider, MD  DiphenhydrAMINE HCl (BENADRYL PO) Take 1 tablet by mouth daily as needed (seasonal allergies).   Yes Historical Provider, MD  ibuprofen (ADVIL,MOTRIN) 200 MG tablet Take 800 mg by mouth every 6 (six) hours as needed for pain.    Yes Historical Provider, MD  aspirin-acetaminophen-caffeine (EXCEDRIN MIGRAINE) [phone] MG per tablet Take 2 tablets by mouth every 6 (six) hours as needed for pain.     Historical Provider, MD    Past Medical History  Diagnosis Date  . Chest pain   . Degenerative joint disease   . Migraine   . Obesity   . Herpes simplex     Past Surgical History  Procedure Laterality Date  . Cesarean section    . Knee surgery    . Exploration post operative open heart    . Cardiovascular stress test      negative    Social History:  reports that she has never smoked. She does not have any smokeless tobacco history on file. She reports that  she does not drink alcohol or use illicit drugs.  Family History  Problem Relation Age of Onset  . Coronary artery disease Mother 47  . Other       There is also history of diabetes, stroke, and cancer in the family    Review of Systems:  As outlined in history of present illness  Physical Exam:  Filed Vitals:   10/14/12 1731  BP: 154/89  Pulse: 51  Temp: 97.9 F (36.6 C)  TempSrc: Oral  Resp: 14  Height: 5' 4.17" (1.63 m)  Weight: 256 lb (116.121 kg)  SpO2: 98%    Constitutional: Vital signs reviewed.He is an obese female in no acute distress HEENT: No pallor, moist oral mucosa Chest: Clear to auscultation bilaterally CVS: Normal S1 and S2 Abdomen: Soft, nontender, nondistended CNS: AAO x3  Labs on Admission:  No results found for this or any previous visit (from the past 48 hour(s)).  Radiological Exams on Admission: Dg Chest 2 View  09/02/2012   *RADIOLOGY REPORT*  Clinical Data: Chest pain  CHEST - 2 VIEW  Comparison: 11/05/2011  Findings: Lungs are clear. No pleural effusion or pneumothorax.  The heart is top normal in size.  Visualized osseous structures are within normal limits.  IMPRESSION: No evidence of acute cardiopulmonary disease.   Original Report Authenticated By: Charline Bills, M.D.   Ct Angio Chest Pe W/cm &/or Wo Cm  09/03/2012   *RADIOLOGY REPORT*  Clinical Data: Left-sided anterior chest wall nonradiating pain. Pain improved with nitroglycerin.  Elevated D-dimer.  CT ANGIOGRAPHY CHEST  Technique:  Multidetector CT imaging of the chest using the standard protocol during bolus administration of intravenous contrast. Multiplanar reconstructed images including MIPs were obtained and reviewed to evaluate the vascular anatomy.  Contrast: 90mL OMNIPAQUE IOHEXOL 350 MG/ML SOLN, 65mL OMNIPAQUE IOHEXOL 350 MG/ML SOLN  I was called to evaluate the patient at about 0530 hours on 09/03/2012 for complaint of contrast extravasation.  Estimate of 70 ml nonionic  contrast material extravasated into the left antecubital fossa.  The patient complained of pain but the no acute distress. The left arm was elevated and an ice pack was in position.  Physical examination demonstrated swelling and redness in the antecubital fossa, extending above and below the elbow laterally.  The IV has been removed.  Distal pulses and grip strength were intact.  Normal capillary reflux. Potential sequelae of contrast extravasation was discussed with the patient.  Care plan for continued observation and conservative management was discussed.  Plan acute be elevated as much as possible with application of ice packs three times a day for the next 3 days R until swelling subsides.  Site will be monitored and physician will be notified if the patient develops any change in sensation, discoloration, blistering, or increasing pain or loss of strength.  Comparison: 05/23/2008  Findings: Technically adequate study with good opacification of the central and segmental pulmonary arteries.  No focal filling defects are identified.  No evidence of significant pulmonary embolus.  Normal heart size.  Normal caliber thoracic aorta.  Esophagus is decompressed.  No significant lymphadenopathy in the chest.  No pleural effusions.  Visualized portions of upper abdominal organs are grossly unremarkable.  No focal consolidation or airspace disease in the lungs.  No pneumothorax.  Airways appear patent.  IMPRESSION: No evidence of significant pulmonary embolus.  No evidence of active pulmonary disease.  Examination was complicated by extravasation of approximately 70 ml nonionic contrast material in the left antecubital fossa.  Conservative management is initiated.   Original Report Authenticated By: Burman Nieves, M.D.    Assessment/Plan Dyslipidemia Discussed about starting medications for elevated triglycerides. Patient reports that since her Medicaid has not been approved yet she cannot afford any medication  at this time. She will discuss about it on future visits  Elevated random blood glucose Check  A1c    Follow up in 3 months    Rayford Williamsen 10/14/2012, 5:41 PM

## 2012-10-14 NOTE — Progress Notes (Signed)
Pt is here today to follow up on lab results. She also thinks that she is having a sinus infection.

## 2012-10-15 LAB — TSH: TSH: 1.043 u[IU]/mL (ref 0.350–4.500)

## 2013-01-27 ENCOUNTER — Emergency Department (HOSPITAL_COMMUNITY): Payer: Self-pay

## 2013-01-27 ENCOUNTER — Encounter (HOSPITAL_COMMUNITY): Payer: Self-pay | Admitting: Emergency Medicine

## 2013-01-27 ENCOUNTER — Emergency Department (HOSPITAL_COMMUNITY)
Admission: EM | Admit: 2013-01-27 | Discharge: 2013-01-27 | Disposition: A | Discharge status: Home / Self Care | Payer: Self-pay | Attending: Emergency Medicine | Admitting: Emergency Medicine

## 2013-01-27 DIAGNOSIS — Z8619 Personal history of other infectious and parasitic diseases: Secondary | ICD-10-CM | POA: Insufficient documentation

## 2013-01-27 DIAGNOSIS — R630 Anorexia: Secondary | ICD-10-CM | POA: Insufficient documentation

## 2013-01-27 DIAGNOSIS — R51 Headache: Secondary | ICD-10-CM | POA: Insufficient documentation

## 2013-01-27 DIAGNOSIS — Z885 Allergy status to narcotic agent status: Secondary | ICD-10-CM | POA: Insufficient documentation

## 2013-01-27 DIAGNOSIS — I517 Cardiomegaly: Secondary | ICD-10-CM | POA: Insufficient documentation

## 2013-01-27 DIAGNOSIS — Z79899 Other long term (current) drug therapy: Secondary | ICD-10-CM | POA: Insufficient documentation

## 2013-01-27 DIAGNOSIS — J22 Unspecified acute lower respiratory infection: Secondary | ICD-10-CM

## 2013-01-27 DIAGNOSIS — R519 Headache, unspecified: Secondary | ICD-10-CM

## 2013-01-27 DIAGNOSIS — M6281 Muscle weakness (generalized): Secondary | ICD-10-CM | POA: Insufficient documentation

## 2013-01-27 DIAGNOSIS — R5381 Other malaise: Secondary | ICD-10-CM | POA: Insufficient documentation

## 2013-01-27 DIAGNOSIS — Z8669 Personal history of other diseases of the nervous system and sense organs: Secondary | ICD-10-CM | POA: Insufficient documentation

## 2013-01-27 DIAGNOSIS — J00 Acute nasopharyngitis [common cold]: Secondary | ICD-10-CM | POA: Insufficient documentation

## 2013-01-27 DIAGNOSIS — E669 Obesity, unspecified: Secondary | ICD-10-CM | POA: Insufficient documentation

## 2013-01-27 DIAGNOSIS — Z881 Allergy status to other antibiotic agents status: Secondary | ICD-10-CM | POA: Insufficient documentation

## 2013-01-27 DIAGNOSIS — M1990 Unspecified osteoarthritis, unspecified site: Secondary | ICD-10-CM | POA: Insufficient documentation

## 2013-01-27 LAB — CBC
HCT: 36.3 % (ref 36.0–46.0)
Hemoglobin: 12 g/dL (ref 12.0–15.0)
MCH: 26.7 pg (ref 26.0–34.0)
MCHC: 33.1 g/dL (ref 30.0–36.0)
MCV: 80.8 fL (ref 78.0–100.0)
RDW: 14 % (ref 11.5–15.5)

## 2013-01-27 LAB — BASIC METABOLIC PANEL
BUN: 19 mg/dL (ref 6–23)
Calcium: 9.9 mg/dL (ref 8.4–10.5)
Creatinine, Ser: 0.64 mg/dL (ref 0.50–1.10)
GFR calc Af Amer: >90 mL/min (ref >90)
GFR calc non Af Amer: >90 mL/min (ref >90)
Glucose, Bld: 98 mg/dL (ref 70–99)

## 2013-01-27 MED ORDER — DEXAMETHASONE SODIUM PHOSPHATE 10 MG/ML IJ SOLN
10.0000 mg | Freq: Once | INTRAMUSCULAR | Status: AC
Start: 2013-01-27 — End: 2013-01-27
  Administered 2013-01-27: 10 mg via INTRAMUSCULAR
  Filled 2013-01-27: qty 1

## 2013-01-27 MED ORDER — KETOROLAC TROMETHAMINE 60 MG/2ML IM SOLN
60.0000 mg | Freq: Once | INTRAMUSCULAR | Status: AC
  Administered 2013-01-27: 60 mg via INTRAMUSCULAR
  Filled 2013-01-27: qty 2

## 2013-01-27 NOTE — ED Notes (Addendum)
Pt reports a productive cough with dark green sputum and headache x5 days. Pt's son recently dx and treated with strep throat and pneumonia. Pt also c/o fatigue.

## 2013-01-27 NOTE — ED Notes (Signed)
Pt reports productive cough, congestion that began this past weekend - pt admits to exertional shortness of breath, generalized fatigue and HA as well. Pt denies any known fever. Has recently been caring for her son who was dx w/ strep and pneumonia recently.

## 2013-01-27 NOTE — ED Notes (Signed)
Pt ambulating independently w/ steady gait on d/c in no acute distress, A&Ox4.D/c instructions reviewed w/ pt and family - pt and family deny any further questions or concerns at present.  

## 2013-01-27 NOTE — ED Provider Notes (Signed)
CSN: [id number]     Arrival date & time 01/27/13  1736 History   First MD Initiated Contact with Patient 01/27/13 1949     Chief Complaint  Patient presents with  . Cough  . Headache   (Consider location/radiation/quality/duration/timing/severity/associated sxs/prior Treatment) HPI This is a 42 year old female presents emergency department chief complaint of productive cough, weakness, fatigue and headache for the past 5 days.  Patient states her son was recently diagnosed with pneumonia and strep throat.  She has been caring for son at home with strep and CAP.  She has had 5 days of productive cough, fatigue, H/A, and fatigue. No sore throat, myalgias,rashes, stiff neck, photophobia, nausea, vomiting. Patient has hx of migraines. Headache described as global, throbbing, worse with cough.Mild relief with otc nyquil. No fevers. No flu shot this year Past Medical History  Diagnosis Date  . Chest pain   . Degenerative joint disease   . Migraine   . Obesity   . Herpes simplex    Past Surgical History  Procedure Laterality Date  . Cesarean section    . Knee surgery    . Exploration post operative open heart    . Cardiovascular stress test      negative   Family History  Problem Relation Age of Onset  . Coronary artery disease Mother 4  . Other       There is also history of diabetes, stroke, and cancer in the family   History  Substance Use Topics  . Smoking status: Never Smoker   . Smokeless tobacco: Not on file  . Alcohol Use: No   OB History   Grav Para Term Preterm Abortions TAB SAB Ect Mult Living                 Review of Systems  Constitutional: Positive for activity change, appetite change and fatigue. Negative for fever and chills.  HENT: Positive for congestion. Negative for sore throat.   Eyes: Negative for photophobia and visual disturbance.  Respiratory: Positive for cough. Negative for chest tightness, shortness of breath and wheezing.   Cardiovascular:  Negative for chest pain.  Gastrointestinal: Negative for nausea, vomiting and abdominal pain.  Genitourinary: Negative for dysuria, urgency and frequency.  Musculoskeletal: Negative for arthralgias, myalgias, neck pain and neck stiffness.  Skin: Negative for rash.  Neurological: Negative for dizziness, weakness and light-headedness.  Hematological: Negative for adenopathy.  Psychiatric/Behavioral: Negative for confusion.    Allergies  Amoxicillin; Bee venom; Imitrex; and Vicodin  Home Medications   Current Outpatient Rx  Name  Route  Sig  Dispense  Refill  . aspirin-acetaminophen-caffeine (EXCEDRIN MIGRAINE) 250-250-65 MG per tablet   Oral   Take 2 tablets by mouth every 8 (eight) hours as needed for migraine.          Marland Kitchen DM-Doxylamine-Acetaminophen (VICKS NYQUIL COLD & FLU) 15-6.25-325 MG/15ML LIQD   Oral   Take 10 mLs by mouth at bedtime as needed (for cough and cold).          BP 108/54  Pulse 48  Temp(Src) 98.5 F (36.9 C) (Oral)  Resp 29  Ht 5\' 3"  (1.6 m)  Wt 253 lb 4.8 oz (114.896 kg)  BMI 44.88 kg/m2  SpO2 98% Physical Exam Appears moderately ill but not toxic; temperature as noted in vitals. Ears normal. Eyes:glassy appearance, no discharge  Heart: RRR, NO M/G/R Throat and pharynx normal.   Neck supple. No adenopathyhy in the neck.  Sinuses non tender.  The chest is  clear. Abdomen is soft and non-tender Speech is clear and goal oriented, follows commands Major Cranial nerves without deficit, no facial droop Normal strength in upper and lower extremities bilaterally including dorsiflexion and plantar flexion, strong and equal grip strength Sensation normal to light and sharp touch Moves extremities without ataxia, coordination intact Normal finger to nose and rapid alternating movements Neg romberg, no pronator drift Normal gait Normal heel-shin and balance    ED Course  Procedures (including critical care time) Labs Review Labs Reviewed  CBC   BASIC METABOLIC PANEL   Imaging Review Dg Chest 2 View  01/27/2013   CLINICAL DATA:  42 year old female with cough and headache.  EXAM: CHEST  2 VIEW  COMPARISON:  09/02/2012  FINDINGS: Mild cardiomegaly noted.  Mild peribronchial thickening is stable.  There is no evidence of focal airspace disease, pulmonary edema, suspicious pulmonary nodule/mass, pleural effusion, or pneumothorax. No acute bony abnormalities are identified.  IMPRESSION: Mild cardiomegaly without evidence of acute cardiopulmonary disease.   Electronically Signed   By: Laveda Abbe M.D.   On: 01/27/2013 19:34    EKG Interpretation   None       MDM   1. Chest cold   2. Headache    Pt CXR negative for acute infiltrate. Patients symptoms are consistent with URI, likely viral etiology. Discussed that antibiotics are not indicated for viral infections. Pt will be discharged with symptomatic treatment.  Verbalizes understanding and is agreeable with plan. Pt is hemodynamically stable & in NAD prior to dc.    Arthor Captain, PA-C 01/28/13 1028

## 2013-01-29 NOTE — ED Provider Notes (Signed)
Medical screening examination/treatment/procedure(s) were performed by non-physician practitioner and as supervising physician I was immediately available for consultation/collaboration.  EKG Interpretation   None         Milos Milligan N Videl Nobrega, DO 01/29/13 1350 

## 2013-02-15 ENCOUNTER — Emergency Department (HOSPITAL_COMMUNITY): Payer: No Typology Code available for payment source

## 2013-02-15 ENCOUNTER — Emergency Department (HOSPITAL_COMMUNITY)
Admission: EM | Admit: 2013-02-15 | Discharge: 2013-02-16 | Discharge status: Against Medical Advice | Payer: No Typology Code available for payment source | Attending: Emergency Medicine | Admitting: Emergency Medicine

## 2013-02-15 ENCOUNTER — Encounter (HOSPITAL_COMMUNITY): Payer: Self-pay | Admitting: Emergency Medicine

## 2013-02-15 DIAGNOSIS — E669 Obesity, unspecified: Secondary | ICD-10-CM | POA: Insufficient documentation

## 2013-02-15 DIAGNOSIS — Z8619 Personal history of other infectious and parasitic diseases: Secondary | ICD-10-CM | POA: Insufficient documentation

## 2013-02-15 DIAGNOSIS — R079 Chest pain, unspecified: Secondary | ICD-10-CM | POA: Insufficient documentation

## 2013-02-15 LAB — CBC
HEMATOCRIT: 36.4 % (ref 36.0–46.0)
Hemoglobin: 12.2 g/dL (ref 12.0–15.0)
MCH: 27.1 pg (ref 26.0–34.0)
MCHC: 33.5 g/dL (ref 30.0–36.0)
MCV: 80.9 fL (ref 78.0–100.0)
Platelets: 210 10*3/uL (ref 150–400)
RBC: 4.5 MIL/uL (ref 3.87–5.11)
RDW: 14.3 % (ref 11.5–15.5)
WBC: 7.1 10*3/uL (ref 4.0–10.5)

## 2013-02-15 LAB — BASIC METABOLIC PANEL
BUN: 15 mg/dL (ref 6–23)
CHLORIDE: 104 meq/L (ref 96–112)
CO2: 25 mEq/L (ref 19–32)
Calcium: 9.6 mg/dL (ref 8.4–10.5)
Creatinine, Ser: 0.78 mg/dL (ref 0.50–1.10)
GFR calc Af Amer: >90 mL/min (ref >90)
GFR calc non Af Amer: >90 mL/min (ref >90)
Glucose, Bld: 141 mg/dL — ABNORMAL HIGH (ref 70–99)
POTASSIUM: 4.6 meq/L (ref 3.7–5.3)
Sodium: 144 mEq/L (ref 137–147)

## 2013-02-15 LAB — POCT I-STAT TROPONIN I: TROPONIN I, POC: 0 ng/mL (ref 0.00–0.08)

## 2013-02-15 NOTE — ED Notes (Signed)
Did not answer when name was called x2.

## 2013-02-15 NOTE — ED Notes (Signed)
Patient presents with c/o chest pain that comes and goes.  2 days ago she started with a migraine headache and her chest was hurting on the left side.  When she would lay down the pain goes through into her back.  Today her pain is in the central of her chest.

## 2013-04-14 ENCOUNTER — Emergency Department (HOSPITAL_COMMUNITY)
Admission: EM | Admit: 2013-04-14 | Discharge: 2013-04-14 | Disposition: A | Discharge status: Home / Self Care | Payer: No Typology Code available for payment source | Source: Home / Self Care | Attending: Family Medicine | Admitting: Family Medicine

## 2013-04-14 ENCOUNTER — Encounter (HOSPITAL_COMMUNITY): Payer: Self-pay | Admitting: Emergency Medicine

## 2013-04-14 ENCOUNTER — Emergency Department (INDEPENDENT_AMBULATORY_CARE_PROVIDER_SITE_OTHER): Payer: No Typology Code available for payment source

## 2013-04-14 DIAGNOSIS — H6593 Unspecified nonsuppurative otitis media, bilateral: Secondary | ICD-10-CM

## 2013-04-14 DIAGNOSIS — H6590 Unspecified nonsuppurative otitis media, unspecified ear: Secondary | ICD-10-CM

## 2013-04-14 DIAGNOSIS — J329 Chronic sinusitis, unspecified: Secondary | ICD-10-CM

## 2013-04-14 DIAGNOSIS — M436 Torticollis: Secondary | ICD-10-CM

## 2013-04-14 MED ORDER — ONDANSETRON 4 MG PO TBDP
8.0000 mg | ORAL_TABLET | Freq: Once | ORAL | Status: AC
Start: 2013-04-14 — End: 2013-04-14
  Administered 2013-04-14: 8 mg via ORAL

## 2013-04-14 MED ORDER — KETOROLAC TROMETHAMINE 60 MG/2ML IM SOLN
INTRAMUSCULAR | Status: AC
  Filled 2013-04-14: qty 2

## 2013-04-14 MED ORDER — KETOROLAC TROMETHAMINE 60 MG/2ML IM SOLN
60.0000 mg | Freq: Once | INTRAMUSCULAR | Status: AC
  Administered 2013-04-14: 60 mg via INTRAMUSCULAR

## 2013-04-14 MED ORDER — CYCLOBENZAPRINE HCL 10 MG PO TABS: 10.0000 mg | ORAL_TABLET | Freq: Three times a day (TID) | ORAL | Status: DC | PRN

## 2013-04-14 MED ORDER — AZITHROMYCIN 250 MG PO TABS: ORAL_TABLET | ORAL | Status: DC

## 2013-04-14 MED ORDER — TRAMADOL HCL 50 MG PO TABS: 50.0000 mg | ORAL_TABLET | Freq: Four times a day (QID) | ORAL | Status: DC | PRN

## 2013-04-14 MED ORDER — ONDANSETRON 4 MG PO TBDP
ORAL_TABLET | ORAL | Status: AC
  Filled 2013-04-14: qty 2

## 2013-04-14 NOTE — ED Notes (Signed)
Checked patient due to complaints of chest pain; patient's BP was elevated with slightly low heart rate; conferred with Dr. Artis FlockKindl and he said patient was stable and could wait in lobby. Pt returned to lobby.

## 2013-04-14 NOTE — ED Provider Notes (Signed)
Medical screening examination/treatment/procedure(s) were performed by resident physician or non-physician practitioner and as supervising physician I was immediately available for consultation/collaboration.   Barkley BrunsKINDL,Bralen Wiltgen DOUGLAS MD.   Linna HoffJames D Karver Fadden, MD 04/14/13 1710

## 2013-04-14 NOTE — Discharge Instructions (Signed)
Please review the instructions below. Take the medications as directed. Take Benadryl 25-50 mg every 4-6 hours regularly through-out the next 4 to 5 days.  Take Pseudofed if you need to be alert during the day. For your neck.Marland Kitchen.Marland Kitchen.Take Ibuprofen 800 mg three times a day for the neck 5 days then only as needed. Use the Tramadol and Flexeril as directed. A heating pad may help. Avoid sitting or standing with your neck in the same position for a long period of time. If your neck pain does not improve or worsens we have provided a referral to an orthopedic docotr with whom you can arrange follow up.  Sinusitis Sinusitis is redness, soreness, and puffiness (inflammation) of the air pockets in the bones of your face (sinuses). The redness, soreness, and puffiness can cause air and mucus to get trapped in your sinuses. This can allow germs to grow and cause an infection.  HOME CARE   Drink enough fluids to keep your pee (urine) clear or pale yellow.  Use a humidifier in your home.  Run a hot shower to create steam in the bathroom. Sit in the bathroom with the door closed. Breathe in the steam 3 4 times a day.  Put a warm, moist washcloth on your face 3 4 times a day, or as told by your doctor.  Use salt water sprays (saline sprays) to wet the thick fluid in your nose. This can help the sinuses drain.  Only take medicine as told by your doctor. GET HELP RIGHT AWAY IF:   Your pain gets worse.  You have very bad headaches.  You are sick to your stomach (nauseous).  You throw up (vomit).  You are very sleepy (drowsy) all the time.  Your face is puffy (swollen).  Your vision changes.  You have a stiff neck.  You have trouble breathing. MAKE SURE YOU:   Understand these instructions.  Will watch your condition.  Will get help right away if you are not doing well or get worse. Document Released: 07/15/2007 Document Revised: 10/21/2011 Document Reviewed: 09/01/2011 [facility] Patient  Information 2014 [place], [place].  Sinus Headache A sinus headache happens when your sinuses become clogged or puffy (swollen). Sinus headaches can be mild or severe. HOME CARE  Take your medicines (antibiotics) as told. Finish them even if you start to feel better.  Only take medicine as told by your doctor.  Use a nose spray if you feel stuffed up (congested). GET HELP RIGHT AWAY IF:  You have a fever.  You have trouble seeing.  You suddenly have pain in your face or head.  You start to twitch or shake (seizure).  You are confused.  You get headaches more than once a week.  Light or sound bothers you.  You feel sick to your stomach (nauseous) or throw up (vomit).  Your headaches do not get better with treatment. MAKE SURE YOU:  Understand these instructions.  Will watch your condition.  Will get help right away if you are not doing well or get worse. Document Released: 05/28/2010 Document Revised: 04/20/2011 Document Reviewed: 05/28/2010 [facility] Patient Information 2014 [place], [place].       Torticollis, Acute You have suddenly (acutely) developed a twisted neck (torticollis). This is usually a self-limited condition. CAUSES  Acute torticollis may be caused by malposition, trauma or infection. Most commonly, acute torticollis is caused by sleeping in an awkward position. Torticollis may also be caused by the flexion, extension or twisting of the neck muscles beyond  their normal position. Sometimes, the exact cause may not be known. SYMPTOMS  Usually, there is pain and limited movement of the neck. Your neck may twist to one side. DIAGNOSIS  The diagnosis is often made by physical examination. X-rays, CT scans or MRIs may be done if there is a history of trauma or concern of infection. TREATMENT  For a common, stiff neck that develops during sleep, treatment is focused on relaxing the contracted neck muscle. Medications (including shots) may be used to treat  the problem. Most cases resolve in several days. Torticollis usually responds to conservative physical therapy. If left untreated, the shortened and spastic neck muscle can cause deformities in the face and neck. Rarely, surgery is required. HOME CARE INSTRUCTIONS   Use over-the-counter and prescription medications as directed by your caregiver.  Do stretching exercises and massage the neck as directed by your caregiver.  Follow up with physical therapy if needed and as directed by your caregiver. SEEK IMMEDIATE MEDICAL CARE IF:   You develop difficulty breathing or noisy breathing (stridor).  You drool, develop trouble swallowing or have pain with swallowing.  You develop numbness or weakness in the hands or feet.  You have changes in speech or vision.  You have problems with urination or bowel movements.  You have difficulty walking.  You have a fever.  You have increased pain. MAKE SURE YOU:   Understand these instructions.  Will watch your condition.  Will get help right away if you are not doing well or get worse. Document Released: 01/24/2000 Document Revised: 04/20/2011 Document Reviewed: 03/06/2009 [facility] Patient Information 2014 [place], [place].  Serous Otitis Media  Serous otitis media is fluid in the middle ear space. This space contains the bones for hearing and air. Air in the middle ear space helps to transmit sound.  The air gets there through the eustachian tube. This tube goes from the back of the nose (nasopharynx) to the middle ear space. It keeps the pressure in the middle ear the same as the outside world. It also helps to drain fluid from the middle ear space. CAUSES  Serous otitis media occurs when the eustachian tube gets blocked. Blockage can come from:  Ear infections.  Colds and other upper respiratory infections.  Allergies.  Irritants such as cigarette smoke.  Sudden changes in air pressure (such as descending in an  airplane).  Enlarged adenoids.  A mass in the nasopharynx. During colds and upper respiratory infections, the middle ear space can become temporarily filled with fluid. This can happen after an ear infection also. Once the infection clears, the fluid will generally drain out of the ear through the eustachian tube. If it does not, then serous otitis media occurs. SIGNS AND SYMPTOMS   Hearing loss.  A feeling of fullness in the ear, without pain.  Young children may not show any symptoms but may show slight behavioral changes, such as agitation, ear pulling, or crying. DIAGNOSIS  Serous otitis media is diagnosed by an ear exam. Tests may be done to check on the movement of the eardrum. Hearing exams may also be done. TREATMENT  The fluid most often goes away without treatment. If allergy is the cause, allergy treatment may be helpful. Fluid that persists for several months may require minor surgery. A small tube is placed in the eardrum to:  Drain the fluid.  Restore the air in the middle ear space. In certain situations, antibiotics are used to avoid surgery. Surgery may be done to  remove enlarged adenoids (if this is the cause). HOME CARE INSTRUCTIONS   Keep children away from tobacco smoke.  Be sure to keep any follow-up appointments. SEEK MEDICAL CARE IF:   Your hearing is not better in 3 months.  Your hearing is worse.  You have ear pain.  You have drainage from the ear.  You have dizziness.  You have serous otitis media only in one ear or have any bleeding from your nose (epistaxis).  You notice a lump on your neck. MAKE SURE YOU:  Understand these instructions.   Will watch your condition.   Will get help right away if you are not doing well or get worse.  Document Released: 04/18/2003 Document Revised: 09/28/2012 Document Reviewed: 08/23/2012 [facility] Patient Information 2014 [place], [place].

## 2013-04-14 NOTE — ED Provider Notes (Signed)
CSN: [id number]     Arrival date & time 04/14/13  1030 History   First MD Initiated Contact with Patient 04/14/13 1106     Chief Complaint  Patient presents with  . Nausea  . Back Pain    Patient is a 43 y.o. female presenting with back pain. The history is provided by the patient.  Back Pain Quality:  Aching and stiffness Pain severity:  Severe Pain is:  Same all the time Onset quality:  Gradual Duration:  5 days Timing:  Constant Progression:  Worsening Chronicity:  New Context: not emotional stress, not falling, not jumping from heights, not lifting heavy objects, not MCA, not MVA, not occupational injury, not pedestrian accident, not physical stress, not recent illness, not recent injury and not twisting   Relieved by:  Nothing Worsened by:  Movement Ineffective treatments:  NSAIDs and OTC medications Associated symptoms: headaches   Associated symptoms: no abdominal pain, no chest pain, no fever and no paresthesias   Risk factors: obesity   Risk factors: no hx of cancer, no hx of osteoporosis, no lack of exercise, no menopause, not pregnant, no recent surgery, no steroid use and no vascular disease   Pt reports she awoke Monday with what she though was a "crick in her neck". The pain gradually worsened and moved down to the lower cervical spine area and now hurts  into the upper back as well and radiates outward bil from the middle into both posterior shoulders and around to her underarm area. She denies chronic h/o same, injury or recent heavy lifting. Denies numbness. Tingling or UE weakness. Ibuprofen and tylenol have not helped. Pt denies actual CP as noted in the lobby, she states the back pain radiates around to her bil rib area. Sinus pain: Pt reports h/o chronic sinus "problems" and over the last week has noted persistent sinus congestion, Bil ear pressure, pressure and frontal area h/a's and facial pain. Occasional dizziness. States symptoms c/w sinus infections in the past.  Denies other URI type sx's. Pt denies N/V/D or other sx's.   Past Medical History  Diagnosis Date  . Chest pain   . Degenerative joint disease   . Migraine   . Obesity   . Herpes simplex    Past Surgical History  Procedure Laterality Date  . Cesarean section    . Knee surgery    . Exploration post operative open heart    . Cardiovascular stress test      negative   Family History  Problem Relation Age of Onset  . Coronary artery disease Mother 37  . Other       There is also history of diabetes, stroke, and cancer in the family   History  Substance Use Topics  . Smoking status: Never Smoker   . Smokeless tobacco: Not on file  . Alcohol Use: No   OB History   Grav Para Term Preterm Abortions TAB SAB Ect Mult Living                 Review of Systems  Constitutional: Negative.  Negative for fever and chills.  HENT: Positive for congestion, ear pain and sinus pressure. Negative for ear discharge.   Eyes: Negative.   Respiratory: Negative.   Cardiovascular: Negative.  Negative for chest pain.  Gastrointestinal: Negative.  Negative for abdominal pain.  Endocrine: Negative.   Musculoskeletal: Positive for back pain.  Skin: Negative for rash.  Allergic/Immunologic: Negative.   Neurological: Positive for headaches. Negative for  paresthesias.  Hematological: Negative.   Psychiatric/Behavioral: Negative.     Allergies  Amoxicillin; Bee venom; Imitrex; and Vicodin  Home Medications   Current Outpatient Rx  Name  Route  Sig  Dispense  Refill  . aspirin-acetaminophen-caffeine (EXCEDRIN MIGRAINE) 250-250-65 MG per tablet   Oral   Take 2 tablets by mouth every 8 (eight) hours as needed for migraine.          Marland Kitchen. azithromycin (ZITHROMAX Z-PAK) 250 MG tablet      Take 2 tabs on day 1 and then 1 tab on days 2-5   6 each   0   . cyclobenzaprine (FLEXERIL) 10 MG tablet   Oral   Take 1 tablet (10 mg total) by mouth 3 (three) times daily as needed for muscle spasms (and  or pain).   20 tablet   0   . DM-Doxylamine-Acetaminophen (VICKS NYQUIL COLD & FLU) 15-6.25-325 MG/15ML LIQD   Oral   Take 10 mLs by mouth at bedtime as needed (for cough and cold).         . traMADol (ULTRAM) 50 MG tablet   Oral   Take 1 tablet (50 mg total) by mouth every 6 (six) hours as needed.   15 tablet   0    BP 155/80  Pulse 54  Temp(Src) 97.6 F (36.4 C) (Oral)  Resp 20  SpO2 100% Physical Exam  Constitutional: She is oriented to person, place, and time. She appears well-developed and well-nourished. No distress.  HENT:  Head: Normocephalic and atraumatic.  Right Ear: A middle ear effusion is present.  Left Ear: A middle ear effusion is present.  Nose: Mucosal edema and sinus tenderness present. Right sinus exhibits maxillary sinus tenderness and frontal sinus tenderness. Left sinus exhibits maxillary sinus tenderness and frontal sinus tenderness.  Mouth/Throat: Uvula is midline, oropharynx is clear and moist and mucous membranes are normal.  Bil serous otitis, no objective s/s infectious process  Neck: Muscular tenderness present. No spinous process tenderness present. Decreased range of motion present.  Bilateral TTP over bil c/spine and posterior shoulders. Raised, firm areas over Trapezius bilaterally c/w muscle spasm  Cardiovascular: Normal rate and regular rhythm.   Pulmonary/Chest: Effort normal and breath sounds normal.  Lymphadenopathy:    She has no cervical adenopathy.  Neurological: She is alert and oriented to person, place, and time.  Skin: Skin is warm and dry.    ED Course  Procedures (including critical care time) Labs Review Labs Reviewed - No data to display Imaging Review Dg Cervical Spine Complete  04/14/2013   CLINICAL DATA:  Neck pain radiating into both shoulders.  EXAM: CERVICAL SPINE  4+ VIEWS  COMPARISON:  None.  FINDINGS: Vertebral body height and alignment are normal. Intervertebral disc space height is maintained. Neural foramina  appear open. Prevertebral soft tissues are normal. Lung apices are clear.  IMPRESSION: Negative exam.   Electronically Signed   By: Drusilla Kannerhomas  Dalessio M.D.   On: 04/14/2013 12:31     MDM   1. Sinusitis   2. Torticollis   3. Bilateral serous otitis media    1. Acute on chronic sinus symptoms c/w sinusitis. Will treat w/ Z-Pak as pt is PEN allergic.  2. 1 week h/o worsening neck pain not associated w/ overuse or injury. Awoke w/ this pain Monday. C/s spine XR normal. No focal findings. Will treat for Torticollis and recommend ortho f/u if not improving. 3. Bilateral  Serous otitis R>L, suspect source of dizziness as well  as acute sinus symptoms. Recommended 1 week tx w/ antihistamines.     Leanne Chang, NP 04/14/13 [phone]

## 2013-04-14 NOTE — ED Notes (Signed)
Patient complains of back pain between shoulders that radiates up to neck; also headaches and nausea; denies fever, vomiting; diarrhea.

## 2013-04-23 ENCOUNTER — Emergency Department (HOSPITAL_COMMUNITY)
Admission: EM | Admit: 2013-04-23 | Discharge: 2013-04-23 | Disposition: A | Discharge status: Home / Self Care | Payer: No Typology Code available for payment source | Source: Home / Self Care | Attending: Family Medicine | Admitting: Family Medicine

## 2013-04-23 ENCOUNTER — Encounter (HOSPITAL_COMMUNITY): Payer: Self-pay | Admitting: Emergency Medicine

## 2013-04-23 ENCOUNTER — Emergency Department (INDEPENDENT_AMBULATORY_CARE_PROVIDER_SITE_OTHER): Payer: No Typology Code available for payment source

## 2013-04-23 DIAGNOSIS — J309 Allergic rhinitis, unspecified: Secondary | ICD-10-CM

## 2013-04-23 DIAGNOSIS — J302 Other seasonal allergic rhinitis: Secondary | ICD-10-CM

## 2013-04-23 MED ORDER — CETIRIZINE HCL 10 MG PO TABS: 10.0000 mg | ORAL_TABLET | Freq: Every day | ORAL | Status: DC

## 2013-04-23 MED ORDER — IPRATROPIUM BROMIDE 0.06 % NA SOLN: 2.0000 | Freq: Four times a day (QID) | NASAL | Status: DC

## 2013-04-23 NOTE — ED Provider Notes (Signed)
CSN: [id number]     Arrival date & time 04/23/13  1427 History   First MD Initiated Contact with Patient 04/23/13 1443     Chief Complaint  Patient presents with  . Recurrent Sinusitis   (Consider location/radiation/quality/duration/timing/severity/associated sxs/prior Treatment) Patient is a 43 y.o. female presenting with URI.  URI Presenting symptoms: congestion, facial pain and rhinorrhea   Severity:  Moderate Onset quality:  Gradual Duration:  10 days Progression:  Unchanged (seen 3/6 with dx of som and sinusitis) Chronicity:  Recurrent Ineffective treatments:  Prescription medications Associated symptoms: sinus pain     Past Medical History  Diagnosis Date  . Chest pain   . Degenerative joint disease   . Migraine   . Obesity   . Herpes simplex    Past Surgical History  Procedure Laterality Date  . Cesarean section    . Knee surgery    . Exploration post operative open heart    . Cardiovascular stress test      negative   Family History  Problem Relation Age of Onset  . Coronary artery disease Mother 78  . Other       There is also history of diabetes, stroke, and cancer in the family   History  Substance Use Topics  . Smoking status: Never Smoker   . Smokeless tobacco: Not on file  . Alcohol Use: No   OB History   Grav Para Term Preterm Abortions TAB SAB Ect Mult Living                 Review of Systems  Constitutional: Negative.   HENT: Positive for congestion, facial swelling, postnasal drip and rhinorrhea.   Respiratory: Negative.   Cardiovascular: Negative.   Gastrointestinal: Negative.     Allergies  Amoxicillin; Bee venom; Imitrex; and Vicodin  Home Medications   Current Outpatient Rx  Name  Route  Sig  Dispense  Refill  . aspirin-acetaminophen-caffeine (EXCEDRIN MIGRAINE) 250-250-65 MG per tablet   Oral   Take 2 tablets by mouth every 8 (eight) hours as needed for migraine.          Marland Kitchen azithromycin (ZITHROMAX Z-PAK) 250 MG tablet     Take 2 tabs on day 1 and then 1 tab on days 2-5   6 each   0   . cetirizine (ZYRTEC) 10 MG tablet   Oral   Take 1 tablet (10 mg total) by mouth daily. One tab daily for allergies   30 tablet   1   . cyclobenzaprine (FLEXERIL) 10 MG tablet   Oral   Take 1 tablet (10 mg total) by mouth 3 (three) times daily as needed for muscle spasms (and or pain).   20 tablet   0   . DM-Doxylamine-Acetaminophen (VICKS NYQUIL COLD & FLU) 15-6.25-325 MG/15ML LIQD   Oral   Take 10 mLs by mouth at bedtime as needed (for cough and cold).         Marland Kitchen ipratropium (ATROVENT) 0.06 % nasal spray   Each Nare   Place 2 sprays into both nostrils 4 (four) times daily.   15 mL   1   . traMADol (ULTRAM) 50 MG tablet   Oral   Take 1 tablet (50 mg total) by mouth every 6 (six) hours as needed.   15 tablet   0    BP 141/66  Pulse 53  Temp(Src) 98.1 F (36.7 C) (Oral)  Resp 18  SpO2 100% Physical Exam  Nursing note and vitals  reviewed. Constitutional: She is oriented to person, place, and time. She appears well-developed and well-nourished.  HENT:  Head: Normocephalic.  Right Ear: External ear normal.  Left Ear: External ear normal.  Mouth/Throat: Oropharynx is clear and moist.  Eyes: Conjunctivae are normal. Pupils are equal, round, and reactive to light.  Neck: Normal range of motion. Neck supple.  Cardiovascular: Normal rate, regular rhythm, normal heart sounds and intact distal pulses.   Lymphadenopathy:    She has no cervical adenopathy.  Neurological: She is alert and oriented to person, place, and time.  Skin: Skin is warm and dry.    ED Course  Procedures (including critical care time) Labs Review Labs Reviewed - No data to display Imaging Review Dg Sinuses Complete  04/23/2013   CLINICAL DATA:  Facial pain  EXAM: PARANASAL SINUSES - COMPLETE 3 + VIEW  COMPARISON:  None.  FINDINGS: The paranasal sinus are aerated. There is no evidence of sinus opacification air-fluid levels or  mucosal thickening. No significant bone abnormalities are seen.  IMPRESSION: No acute abnormality noted.   Electronically Signed   By: Alcide CleverMark  Lukens M.D.   On: 04/23/2013 15:57   X-rays reviewed and report per radiologist.   MDM   1. Allergic rhinitis, seasonal        Linna HoffJames D Catelyn Friel, MD 04/23/13 1616

## 2013-04-23 NOTE — ED Notes (Signed)
C/O SINUS PROBLEMS STATES SHE WAS HERE A WEEK AGO AND WAS DX WITH SINUSITIS  ANTIBIOTIC WAS GIVEN AND HAS FINISHED STATES SHE HAS BEEN USING BENADRYL AND SUDAFED

## 2013-05-06 ENCOUNTER — Emergency Department (HOSPITAL_COMMUNITY)
Admission: EM | Admit: 2013-05-06 | Discharge: 2013-05-07 | Disposition: A | Discharge status: Home / Self Care | Payer: No Typology Code available for payment source | Attending: Emergency Medicine | Admitting: Emergency Medicine

## 2013-05-06 ENCOUNTER — Encounter (HOSPITAL_COMMUNITY): Payer: Self-pay | Admitting: Emergency Medicine

## 2013-05-06 DIAGNOSIS — R519 Headache, unspecified: Secondary | ICD-10-CM

## 2013-05-06 DIAGNOSIS — Z8619 Personal history of other infectious and parasitic diseases: Secondary | ICD-10-CM | POA: Insufficient documentation

## 2013-05-06 DIAGNOSIS — I10 Essential (primary) hypertension: Secondary | ICD-10-CM | POA: Insufficient documentation

## 2013-05-06 DIAGNOSIS — Z79899 Other long term (current) drug therapy: Secondary | ICD-10-CM | POA: Insufficient documentation

## 2013-05-06 DIAGNOSIS — G8929 Other chronic pain: Secondary | ICD-10-CM

## 2013-05-06 DIAGNOSIS — Z880 Allergy status to penicillin: Secondary | ICD-10-CM | POA: Insufficient documentation

## 2013-05-06 DIAGNOSIS — G43909 Migraine, unspecified, not intractable, without status migrainosus: Secondary | ICD-10-CM | POA: Insufficient documentation

## 2013-05-06 DIAGNOSIS — R51 Headache: Secondary | ICD-10-CM

## 2013-05-06 DIAGNOSIS — E669 Obesity, unspecified: Secondary | ICD-10-CM | POA: Insufficient documentation

## 2013-05-06 DIAGNOSIS — M1990 Unspecified osteoarthritis, unspecified site: Secondary | ICD-10-CM | POA: Insufficient documentation

## 2013-05-06 MED ORDER — DIPHENHYDRAMINE HCL 50 MG/ML IJ SOLN
50.0000 mg | Freq: Once | INTRAMUSCULAR | Status: AC
  Administered 2013-05-06: 50 mg via INTRAMUSCULAR
  Filled 2013-05-06: qty 1

## 2013-05-06 MED ORDER — METOCLOPRAMIDE HCL 5 MG/ML IJ SOLN
10.0000 mg | Freq: Once | INTRAMUSCULAR | Status: AC
  Administered 2013-05-06: 10 mg via INTRAMUSCULAR
  Filled 2013-05-06: qty 2

## 2013-05-06 MED ORDER — KETOROLAC TROMETHAMINE 60 MG/2ML IM SOLN
60.0000 mg | Freq: Once | INTRAMUSCULAR | Status: AC
  Administered 2013-05-06: 60 mg via INTRAMUSCULAR
  Filled 2013-05-06: qty 2

## 2013-05-06 NOTE — ED Notes (Signed)
A chronic h/a for 3 mos. A mos. Ago, went to [facility] and told it was a sinus infection and prescribed antibiotics. Hx. Of migraines.

## 2013-05-06 NOTE — ED Provider Notes (Signed)
CSN: [id number]     Arrival date & time 05/06/13  2038 History   First MD Initiated Contact with Patient 05/06/13 2232     Chief Complaint  Patient presents with  . Headache     (Consider location/radiation/quality/duration/timing/severity/associated sxs/prior Treatment) HPI Comments: Pt with long standing history of HA's, usually affects left eye and extends to head.  She used to take imitrex, but began to have side effects and quit taking them.  She has had a daily HA for a few months, was seen at urgent care twice, treated for allergies and sinus infection with no relief.  Takes OTC migraine pills and has tried benadryl as well with no relief.  She has an appt with a new internist but not for a couiple of more weeks.  HA is otherwise similar in intensity for past few weeks, some photophoiba, had N/V today and has some numbness only to right temple area in a small patch.  No visual changes, no focal numbness or weakness in arms or legs.  Sister has a h/o migraines.  No rash or stiff neck, no fever.    Patient is a 43 y.o. female presenting with headaches. The history is provided by the patient.  Headache Associated symptoms: nausea, photophobia and vomiting   Associated symptoms: no abdominal pain and no fever     Past Medical History  Diagnosis Date  . Chest pain   . Degenerative joint disease   . Migraine   . Obesity   . Herpes simplex   . Hypertension    Past Surgical History  Procedure Laterality Date  . Cesarean section    . Knee surgery    . Exploration post operative open heart    . Cardiovascular stress test      negative  . Asd repair     Family History  Problem Relation Age of Onset  . Coronary artery disease Mother 446  . Other       There is also history of diabetes, stroke, and cancer in the family   History  Substance Use Topics  . Smoking status: Never Smoker   . Smokeless tobacco: Not on file  . Alcohol Use: No   OB History   Grav Para Term Preterm  Abortions TAB SAB Ect Mult Living                 Review of Systems  Constitutional: Negative for fever and chills.  Eyes: Positive for photophobia.  Respiratory: Negative for shortness of breath.   Gastrointestinal: Positive for nausea and vomiting. Negative for abdominal pain.  Neurological: Positive for headaches.  All other systems reviewed and are negative.      Allergies  Amoxicillin; Bee venom; Imitrex; and Vicodin  Home Medications   Current Outpatient Rx  Name  Route  Sig  Dispense  Refill  . cetirizine (ZYRTEC) 10 MG tablet   Oral   Take 1 tablet (10 mg total) by mouth daily. One tab daily for allergies   30 tablet   1   . cyclobenzaprine (FLEXERIL) 10 MG tablet   Oral   Take 1 tablet (10 mg total) by mouth 3 (three) times daily as needed for muscle spasms (and or pain).   20 tablet   0   . ibuprofen (ADVIL,MOTRIN) 200 MG tablet   Oral   Take 800 mg by mouth every 6 (six) hours as needed for moderate pain.         . pseudoephedrine (SUDAFED) 30  MG tablet   Oral   Take 30 mg by mouth every 4 (four) hours as needed for congestion.         Marland Kitchen ketoprofen (ORUDIS) 50 MG capsule   Oral   Take 1 capsule (50 mg total) by mouth 4 (four) times daily as needed for moderate pain.   20 capsule   0   . metoCLOPramide (REGLAN) 10 MG tablet   Oral   Take 1 tablet (10 mg total) by mouth every 8 (eight) hours as needed for nausea (or headache).   20 tablet   0   . rizatriptan (MAXALT-MLT) 10 MG disintegrating tablet   Oral   Take 1 tablet (10 mg total) by mouth as needed for migraine. May repeat in 2 hours if needed   10 tablet   0    BP 128/63  Pulse 56  Temp(Src) 98.4 F (36.9 C) (Oral)  Resp 12  Ht 5\' 3"  (1.6 m)  Wt 250 lb (113.399 kg)  BMI 44.30 kg/m2  SpO2 95% Physical Exam  Nursing note and vitals reviewed. Constitutional: She is oriented to person, place, and time. She appears well-developed and well-nourished. No distress.  HENT:  Head:  Normocephalic and atraumatic.  Mouth/Throat: Uvula is midline, oropharynx is clear and moist and mucous membranes are normal.  Eyes: EOM are normal. Pupils are equal, round, and reactive to light. Right eye exhibits no chemosis. Left eye exhibits no chemosis. Right conjunctiva is not injected. Left conjunctiva is not injected. No scleral icterus. Right eye exhibits normal extraocular motion. Left eye exhibits normal extraocular motion.  Fundoscopic exam:      The right eye shows no papilledema.       The left eye shows no papilledema.  Neck: Normal range of motion. Neck supple.  Cardiovascular: Normal rate and regular rhythm.   Pulmonary/Chest: Effort normal.  Abdominal: Soft. She exhibits no distension. There is no tenderness.  Neurological: She is alert and oriented to person, place, and time. No cranial nerve deficit. She exhibits normal muscle tone. Coordination normal.  Skin: Skin is warm. She is not diaphoretic.    ED Course  Procedures (including critical care time) Labs Review Labs Reviewed - No data to display Imaging Review No results found.   EKG Interpretation None     RA sat is 95% and I interpret to be adequate  12:05 AM Pt's HA is much imrpoved.  Will d/c home with Rx and referrals to HA wellness and also to neuro.  Pt encouraged to keep her appt with PMD.    MDM   Final diagnoses:  Chronic headache   Pt with chronic HA, h/o reported migraines.  No subj visual symptoms.  Pt with new appt with a PCP coming up soon.  Will also refer to [facility] as appt.  No focal deficits on current exam, no fever, no stiff neck.  Will treat with HA cocktail IM and monitor.      Gavin Pound. Korin Hartwell, MD 05/07/13 0005

## 2013-05-07 MED ORDER — RIZATRIPTAN BENZOATE 10 MG PO TBDP: 10.0000 mg | ORAL_TABLET | ORAL | Status: DC | PRN

## 2013-05-07 MED ORDER — METOCLOPRAMIDE HCL 10 MG PO TABS: 10.0000 mg | ORAL_TABLET | Freq: Three times a day (TID) | ORAL | Status: DC | PRN

## 2013-05-07 MED ORDER — KETOPROFEN 50 MG PO CAPS: 50.0000 mg | ORAL_CAPSULE | Freq: Four times a day (QID) | ORAL | Status: DC | PRN

## 2013-05-07 NOTE — Discharge Instructions (Signed)
Migraine Headache A migraine headache is an intense, throbbing pain on one or both sides of your head. A migraine can last for 30 minutes to several hours. CAUSES  The exact cause of a migraine headache is not always known. However, a migraine may be caused when nerves in the brain become irritated and release chemicals that cause inflammation. This causes pain. Certain things may also trigger migraines, such as:  Alcohol.  Smoking.  Stress.  Menstruation.  Aged cheeses.  Foods or drinks that contain nitrates, glutamate, aspartame, or tyramine.  Lack of sleep.  Chocolate.  Caffeine.  Hunger.  Physical exertion.  Fatigue.  Medicines used to treat chest pain (nitroglycerine), birth control pills, estrogen, and some blood pressure medicines. SIGNS AND SYMPTOMS  Pain on one or both sides of your head.  Pulsating or throbbing pain.  Severe pain that prevents daily activities.  Pain that is aggravated by any physical activity.  Nausea, vomiting, or both.  Dizziness.  Pain with exposure to bright lights, loud noises, or activity.  General sensitivity to bright lights, loud noises, or smells. Before you get a migraine, you may get warning signs that a migraine is coming (aura). An aura may include:  Seeing flashing lights.  Seeing bright spots, halos, or zig-zag lines.  Having tunnel vision or blurred vision.  Having feelings of numbness or tingling.  Having trouble talking.  Having muscle weakness. DIAGNOSIS  A migraine headache is often diagnosed based on:  Symptoms.  Physical exam.  A CT scan or MRI of your head. These imaging tests cannot diagnose migraines, but they can help rule out other causes of headaches. TREATMENT Medicines may be given for pain and nausea. Medicines can also be given to help prevent recurrent migraines.  HOME CARE INSTRUCTIONS  Only take over-the-counter or prescription medicines for pain or discomfort as directed by your  health care provider. The use of long-term narcotics is not recommended.  Lie down in a dark, quiet room when you have a migraine.  Keep a journal to find out what may trigger your migraine headaches. For example, write down:  What you eat and drink.  How much sleep you get.  Any change to your diet or medicines.  Limit alcohol consumption.  Quit smoking if you smoke.  Get 7 9 hours of sleep, or as recommended by your health care provider.  Limit stress.  Keep lights dim if bright lights bother you and make your migraines worse. SEEK IMMEDIATE MEDICAL CARE IF:   Your migraine becomes severe.  You have a fever.  You have a stiff neck.  You have vision loss.  You have muscular weakness or loss of muscle control.  You start losing your balance or have trouble walking.  You feel faint or pass out.  You have severe symptoms that are different from your first symptoms. MAKE SURE YOU:   Understand these instructions.  Will watch your condition.  Will get help right away if you are not doing well or get worse. Document Released: 01/26/2005 Document Revised: 11/16/2012 Document Reviewed: 10/03/2012 [employer] Patient Information 2014 [employer].  

## 2013-05-07 NOTE — ED Notes (Signed)
Pt states understanding of discharge instructions 

## 2013-07-04 ENCOUNTER — Encounter: Payer: Self-pay | Admitting: *Deleted

## 2013-07-04 ENCOUNTER — Encounter: Payer: Self-pay | Admitting: Neurology

## 2013-07-05 ENCOUNTER — Telehealth: Payer: Self-pay | Admitting: Neurology

## 2013-07-05 ENCOUNTER — Ambulatory Visit: Payer: No Typology Code available for payment source | Admitting: Neurology

## 2013-07-05 NOTE — Telephone Encounter (Signed)
This patient did not show for a new patient appointment today. 

## 2013-07-26 ENCOUNTER — Encounter (INDEPENDENT_AMBULATORY_CARE_PROVIDER_SITE_OTHER): Payer: Self-pay

## 2013-07-26 ENCOUNTER — Encounter: Payer: Self-pay | Admitting: Neurology

## 2013-07-26 ENCOUNTER — Ambulatory Visit (INDEPENDENT_AMBULATORY_CARE_PROVIDER_SITE_OTHER): Payer: No Typology Code available for payment source | Admitting: Neurology

## 2013-07-26 VITALS — BP 130/80 | HR 60 | Ht 63.5 in | Wt 239.0 lb

## 2013-07-26 DIAGNOSIS — G43019 Migraine without aura, intractable, without status migrainosus: Secondary | ICD-10-CM

## 2013-07-26 HISTORY — DX: Migraine without aura, intractable, without status migrainosus: G43.019

## 2013-07-26 MED ORDER — METOCLOPRAMIDE HCL 10 MG PO TABS: 10.0000 mg | ORAL_TABLET | Freq: Three times a day (TID) | ORAL | Status: DC | PRN

## 2013-07-26 MED ORDER — KETOPROFEN 50 MG PO CAPS: 50.0000 mg | ORAL_CAPSULE | Freq: Four times a day (QID) | ORAL | Status: DC | PRN

## 2013-07-26 MED ORDER — TOPIRAMATE 25 MG PO TABS: ORAL_TABLET | ORAL | Status: DC

## 2013-07-26 MED ORDER — RIZATRIPTAN BENZOATE 10 MG PO TBDP: 10.0000 mg | ORAL_TABLET | Freq: Three times a day (TID) | ORAL | Status: DC | PRN

## 2013-07-26 NOTE — Progress Notes (Signed)
Reason for visit: Headache  [name] is a 43 y.o. female  History of present illness:  Pamela [name] is a 43 year old right-handed white female with a history of headaches since age 43. She indicates that over the last year, the headaches have become more frequent. She indicates that she is having at least one headache a week, and the headaches may last up to 3 days at a time. She indicates that the headaches generally will begin behind the left eye, very occasionally behind the right eye. The headaches are associated with photophobia and phonophobia and are associated with a sharp jabbing pain. She has had some nausea and vomiting with the headache, and some blurring of vision. She may have dizziness with the headache. She denies any neck stiffness, but she does have scalp tenderness. Lack of sleep, heat, perfume odors, cigarette smoke and bright light may bring on the headache. She denies any focal numbness or weakness with the headache, but she does indicate that she may have some sinus issues. She recently was placed on Reglan and Maxalt with some benefit. She is sent to this office for an evaluation.  Past Medical History  Diagnosis Date  . Chest pain   . Degenerative joint disease   . Migraine   . Obesity   . Herpes simplex   . Hypertension   . Osteoarthritis of knee   . Migraine without aura, with intractable migraine, so stated, without mention of status migrainosus 07/26/2013    Past Surgical History  Procedure Laterality Date  . Cesarean section    . Knee surgery  2012    right  . Exploration post operative open heart    . Cardiovascular stress test      negative  . Asd repair    . Asd repair  [id number]  . Carpal tunnel release      Family History  Problem Relation Age of Onset  . Coronary artery disease Mother [id number]  . Stroke Mother   . Migraines Mother   . Other       There is also history of diabetes, stroke, and cancer in the family  . Migraines Sister      Social history:  reports that she has never smoked. She has never used smokeless tobacco. She reports that she drinks alcohol. She reports that she does not use illicit drugs.  Medications:  Current Outpatient Prescriptions on File Prior to Visit  Medication Sig Dispense Refill  . cetirizine (ZYRTEC) 10 MG tablet Take 1 tablet (10 mg total) by mouth daily. One tab daily for allergies  30 tablet  1  . ibuprofen (ADVIL,MOTRIN) 200 MG tablet Take 800 mg by mouth every 6 (six) hours as needed for moderate pain.      . pseudoephedrine (SUDAFED) 30 MG tablet Take 30 mg by mouth every 4 (four) hours as needed for congestion.       No current facility-administered medications on file prior to visit.      Allergies  Allergen Reactions  . Amoxicillin Nausea Only  . Bee Venom   . Imitrex [Sumatriptan] Other (See Comments)    Gives chest pains  . Vicodin [Hydrocodone-Acetaminophen] Other (See Comments)    Migraines    ROS:  Out of a complete 14 system review of symptoms, the patient complains only of the following symptoms, and all other reviewed systems are negative.  Fatigue Blurred vision, eye pain  Shortness of breath Constipation Achy muscles, allergies Confusion, headache, dizziness Not  enough sleep Sleepiness  Blood pressure 130/80, pulse 60, height 5' 3.5" (1.613 m), weight 239 lb (108.41 kg).  Physical Exam  General: The patient is alert and cooperative at the time of the examination. The patient is markedly obese.  Eyes: Pupils are equal, round, and reactive to light. Discs are flat bilaterally.  Neck: The neck is supple, no carotid bruits are noted.  Respiratory: The respiratory examination is clear.  Cardiovascular: The cardiovascular examination reveals a regular rate and rhythm, no obvious murmurs or rubs are noted.  Neuromuscular: Range of movement of the cervical spine is full. No crepitus is noted in the temporomandibular joints.  Skin: Extremities are  without significant edema.  Neurologic Exam  Mental status: The patient is alert and oriented x 3 at the time of the examination. The patient has apparent normal recent and remote memory, with an apparently normal attention span and concentration ability.  Cranial nerves: Facial symmetry is present. There is good sensation of the face to pinprick and soft touch bilaterally. The strength of the facial muscles and the muscles to head turning and shoulder shrug are normal bilaterally. Speech is well enunciated, no aphasia or dysarthria is noted. Extraocular movements are full. Visual fields are full. The tongue is midline, and the patient has symmetric elevation of the soft palate. No obvious hearing deficits are noted.  Motor: The motor testing reveals 5 over 5 strength of all 4 extremities. Good symmetric motor tone is noted throughout.  Sensory: Sensory testing is intact to pinprick, soft touch, vibration sensation, and position sense on all 4 extremities. No evidence of extinction is noted.  Coordination: Cerebellar testing reveals good finger-nose-finger and heel-to-shin bilaterally.  Gait and station: Gait is normal. Tandem gait is normal. Romberg is negative. No drift is seen.  Reflexes: Deep tendon reflexes are symmetric and normal bilaterally. Toes are downgoing bilaterally.   Assessment/Plan:  1. Migraine headache  The patient will be placed on Topamax, and work up to a 75 mg nightly dose. She will continue the Maxalt and Reglan if needed. She will followup through this office in about 3 months. She will contact our office if he is not tolerating medications or if the headaches are persistent.  Marlan Palau. Keith Idalis Hoelting MD 07/26/2013 4:47 PM  [facility] [address] [place], [place] [id number]  Phone [phone] Fax [phone]

## 2013-07-26 NOTE — Patient Instructions (Signed)
Topamax (topiramate) is a seizure medication that has an FDA approval for seizures and for migraine headache. Potential side effects of this medication include weight loss, cognitive slowing, tingling in the fingers and toes, and carbonated drinks will taste bad. If any significant side effects are noted on this drug, please contact our office.   Migraine Headache A migraine headache is an intense, throbbing pain on one or both sides of your head. A migraine can last for 30 minutes to several hours. CAUSES  The exact cause of a migraine headache is not always known. However, a migraine may be caused when nerves in the brain become irritated and release chemicals that cause inflammation. This causes pain. Certain things may also trigger migraines, such as:  Alcohol.  Smoking.  Stress.  Menstruation.  Aged cheeses.  Foods or drinks that contain nitrates, glutamate, aspartame, or tyramine.  Lack of sleep.  Chocolate.  Caffeine.  Hunger.  Physical exertion.  Fatigue.  Medicines used to treat chest pain (nitroglycerine), birth control pills, estrogen, and some blood pressure medicines. SIGNS AND SYMPTOMS  Pain on one or both sides of your head.  Pulsating or throbbing pain.  Severe pain that prevents daily activities.  Pain that is aggravated by any physical activity.  Nausea, vomiting, or both.  Dizziness.  Pain with exposure to bright lights, loud noises, or activity.  General sensitivity to bright lights, loud noises, or smells. Before you get a migraine, you may get warning signs that a migraine is coming (aura). An aura may include:  Seeing flashing lights.  Seeing bright spots, halos, or zig-zag lines.  Having tunnel vision or blurred vision.  Having feelings of numbness or tingling.  Having trouble talking.  Having muscle weakness. DIAGNOSIS  A migraine headache is often diagnosed based on:  Symptoms.  Physical exam.  A CT scan or MRI of  your head. These imaging tests cannot diagnose migraines, but they can help rule out other causes of headaches. TREATMENT Medicines may be given for pain and nausea. Medicines can also be given to help prevent recurrent migraines.  HOME CARE INSTRUCTIONS  Only take over-the-counter or prescription medicines for pain or discomfort as directed by your health care Nikkita Adeyemi. The use of long-term narcotics is not recommended.  Lie down in a dark, quiet room when you have a migraine.  Keep a journal to find out what may trigger your migraine headaches. For example, write down:  What you eat and drink.  How much sleep you get.  Any change to your diet or medicines.  Limit alcohol consumption.  Quit smoking if you smoke.  Get 7-9 hours of sleep, or as recommended by your health care Oziel Beitler.  Limit stress.  Keep lights dim if bright lights bother you and make your migraines worse. SEEK IMMEDIATE MEDICAL CARE IF:   Your migraine becomes severe.  You have a fever.  You have a stiff neck.  You have vision loss.  You have muscular weakness or loss of muscle control.  You start losing your balance or have trouble walking.  You feel faint or pass out.  You have severe symptoms that are different from your first symptoms. MAKE SURE YOU:   Understand these instructions.  Will watch your condition.  Will get help right away if you are not doing well or get worse. Document Released: 01/26/2005 Document Revised: 11/16/2012 Document Reviewed: 10/03/2012 [facility] Patient Information 2015 [employer] [place]. This information is not intended to replace advice given to you by  your health care Roylene Heaton. Make sure you discuss any questions you have with your health care Lynniah Janoski.  

## 2013-10-10 ENCOUNTER — Other Ambulatory Visit: Payer: Self-pay | Admitting: Obstetrics and Gynecology

## 2013-10-10 DIAGNOSIS — Z1231 Encounter for screening mammogram for malignant neoplasm of breast: Secondary | ICD-10-CM

## 2013-10-11 ENCOUNTER — Telehealth: Payer: Self-pay | Admitting: *Deleted

## 2013-10-11 NOTE — Telephone Encounter (Signed)
Spoke with patient to r/s appointment due to NP MM will be out of the office on 10/26/13, patient was r/s to 10/25/13 at 3:30 pm.

## 2013-10-23 ENCOUNTER — Ambulatory Visit
Admission: RE | Admit: 2013-10-23 | Discharge: 2013-10-23 | Disposition: A | Discharge status: Home / Self Care | Payer: No Typology Code available for payment source | Source: Ambulatory Visit | Attending: Obstetrics and Gynecology | Admitting: Obstetrics and Gynecology

## 2013-10-23 ENCOUNTER — Encounter (INDEPENDENT_AMBULATORY_CARE_PROVIDER_SITE_OTHER): Payer: Self-pay

## 2013-10-23 DIAGNOSIS — Z1231 Encounter for screening mammogram for malignant neoplasm of breast: Secondary | ICD-10-CM

## 2013-10-25 ENCOUNTER — Ambulatory Visit: Payer: Self-pay | Admitting: Adult Health

## 2013-10-26 ENCOUNTER — Ambulatory Visit: Payer: No Typology Code available for payment source | Admitting: Adult Health

## 2013-10-31 ENCOUNTER — Encounter: Payer: Self-pay | Admitting: Adult Health

## 2013-11-10 ENCOUNTER — Ambulatory Visit (INDEPENDENT_AMBULATORY_CARE_PROVIDER_SITE_OTHER): Payer: No Typology Code available for payment source | Admitting: Internal Medicine

## 2013-11-10 ENCOUNTER — Encounter: Payer: Self-pay | Admitting: Internal Medicine

## 2013-11-10 VITALS — BP 131/76 | HR 51 | Ht 63.0 in | Wt 223.0 lb

## 2013-11-10 DIAGNOSIS — Q249 Congenital malformation of heart, unspecified: Secondary | ICD-10-CM

## 2013-11-10 DIAGNOSIS — Z8774 Personal history of (corrected) congenital malformations of heart and circulatory system: Secondary | ICD-10-CM

## 2013-11-10 DIAGNOSIS — Z8679 Personal history of other diseases of the circulatory system: Secondary | ICD-10-CM

## 2013-11-10 DIAGNOSIS — R0602 Shortness of breath: Secondary | ICD-10-CM

## 2013-11-10 DIAGNOSIS — R0789 Other chest pain: Secondary | ICD-10-CM

## 2013-11-10 DIAGNOSIS — Z8249 Family history of ischemic heart disease and other diseases of the circulatory system: Secondary | ICD-10-CM

## 2013-11-10 DIAGNOSIS — R011 Cardiac murmur, unspecified: Secondary | ICD-10-CM

## 2013-11-10 NOTE — Patient Instructions (Addendum)
Your physician has requested that you have an exercise tolerance test. For further information please visit [email or web address]. Please also follow instruction sheet, as given.  Your physician has requested that you have an echocardiogram. Echocardiography is a painless test that uses sound waves to create images of your heart. It provides your doctor with information about the size and shape of your heart and how well your heart's chambers and valves are working. This procedure takes approximately one hour. There are no restrictions for this procedure.  Your physician recommends that you schedule a follow-up appointment after your tests with Dr. Rennis GoldenHilty.

## 2013-11-10 NOTE — Progress Notes (Signed)
OFFICE NOTE  Chief Complaint:  DOE, risk stratification  Primary Care Physician: Gwynneth Aliment, MD  HPI:  Pamela [name] is a pleasant 43 year old female with a past medical history significant for congenital heart disease. 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 Subsequently she was told that this may have opened. Over the year she said a couple episodes of chest pain and in 2012 was evaluated by Dr. Johney Frame and a stress test was ordered. She also had an echocardiogram. The echocardiogram demonstrated mild LVH with normal systolic function. There was mild left atrial enlargement but no evidence for PFO or atrial septal defect. No significant valvular regurgitation was noted. She also had a stress test which was 8 Lexus scan Myoview and demonstrated anterior attenuation artifact, no wall motion abnormalities and no evidence of ischemia with EF of 59%.  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 Her OB/GYN is concerned about cardiovascular risk, especially since Pamela [name]'s mother had an MI at age 69. Currently she reports some mild shortness of breath with exertion but denies any chest pain. She does not have palpitations. She was having problems with arthritis and pain in her knees however underwent knee replacement in 2012 and has done well since then. She is currently on phentermine for weight loss. She also has migraine headaches and was recently prescribed Topamax but has not taken the medication.  PMHx:  Past Medical History  Diagnosis Date  . Chest pain   . Degenerative joint disease   . Migraine   . Obesity   . Herpes simplex   . Hypertension   . Osteoarthritis of knee   . Migraine without aura, with intractable migraine, so stated, without mention of status migrainosus 07/26/2013    Past Surgical History  Procedure Laterality Date  . Cesarean section    . Knee surgery   2012    right  . Exploration post operative open heart    . Cardiovascular stress test      negative  . Asd repair    . Asd repair  [id number]  . Carpal tunnel release      FAMHx:  Family History  Problem Relation Age of Onset  . Coronary artery disease Mother 71  . Stroke Mother   . Migraines Mother   . Other       There is also history of diabetes, stroke, and cancer in the family  . Migraines Sister     SOCHx:   reports that she has never smoked. She has never used smokeless tobacco. She reports that she drinks alcohol. She reports that she does not use illicit drugs.  ALLERGIES:  Allergies  Allergen Reactions  . Amoxicillin Nausea Only  . Bee Venom   . Imitrex [Sumatriptan] Other (See Comments)    Gives chest pains  . Vicodin [Hydrocodone-Acetaminophen] Other (See Comments)    Migraines    ROS: A comprehensive review of systems was negative except for: Respiratory: positive for dyspnea on exertion  HOME MEDS: Current Outpatient Prescriptions  Medication Sig Dispense Refill  . cetirizine (ZYRTEC) 10 MG tablet Take 1 tablet (10 mg total) by mouth daily. One tab daily for allergies  30 tablet  1  . Cholecalciferol (VITAMIN D3) 5000 UNITS TABS Take 5,000 Units by mouth daily.      Marland Kitchen ibuprofen (ADVIL,MOTRIN) 200 MG tablet Take 800 mg by mouth  every 6 (six) hours as needed for moderate pain.      . magnesium 30 MG tablet Take 30 mg by mouth 2 (two) times daily.      . Omega-3 Fatty Acids (FISH OIL) 1200 MG CPDR Take 1,200 mg by mouth.      . phentermine 37.5 MG capsule Take 37.5 mg by mouth every morning.      . pseudoephedrine (SUDAFED) 30 MG tablet Take 30 mg by mouth every 4 (four) hours as needed for congestion.      . vitamin A 8000 UNIT capsule Take 8,000 Units by mouth daily.      . vitamin E 400 UNIT capsule Take 400 Units by mouth daily.      Marland Kitchen. ketoprofen (ORUDIS) 50 MG capsule Take 1 capsule (50 mg total) by mouth 4 (four) times daily as needed for moderate pain.   30 capsule  3  . metoCLOPramide (REGLAN) 10 MG tablet Take 1 tablet (10 mg total) by mouth every 8 (eight) hours as needed for nausea (or headache).  30 tablet  3  . rizatriptan (MAXALT-MLT) 10 MG disintegrating tablet Take 1 tablet (10 mg total) by mouth 3 (three) times daily as needed for migraine. May repeat in 2 hours if needed  10 tablet  3  . topiramate (TOPAMAX) 25 MG tablet Take one tablet at night for one week, then take 2 tablets at night for one week, then take 3 tablets at night.  90 tablet  3   No current facility-administered medications for this visit.    LABS/IMAGING: No results found for this or any previous visit (from the past 48 hour(s)). No results found.  VITALS: BP 131/76  Pulse 51  Ht 5\' 3"  (1.6 m)  Wt 223 lb (101.152 kg)  BMI 39.51 kg/m2  EXAM: General appearance: alert and no distress Neck: no carotid bruit and no JVD Lungs: clear to auscultation bilaterally Heart: regular rate and rhythm, S1, S2 normal and systolic murmur: early systolic 2/6, blowing at apex Abdomen: soft, non-tender; bowel sounds normal; no masses,  no organomegaly Extremities: extremities normal, atraumatic, no cyanosis or edema Pulses: 2+ and symmetric Skin: Skin color, texture, turgor normal. No rashes or lesions Neurologic: Grossly normal Psych: Pleasant  EKG: Sinus bradycardia 51  ASSESSMENT: 1. Congenital heart disease status post surgical repair of "hole in the heart" 2.   History of atypical chest pain 3.   Shortness of breath 4.   Family history of premature CAD 5.   Murmur  PLAN: 1.   Pamela [name] is here for further cardiac evaluation. In 2012 she had a negative nuclear stress test and an echocardiogram which was essentially unremarkable. She does have an audible murmur today and it bears further investigation as it was not reported in the past. She did mention she had prior heart surgery when she was very young. She does report some mild shortness of breath and I would  recommend an exercise treadmill stress test which she should be able to do because she recently had been exercising on a treadmill. Overall this should help better risk stratify her with regards to hormone therapy if it would be necessary for her premature ovarian failure.  Plan to see her back to discuss results of the studies in a few weeks.  Thanks for the kind referral.  Chrystie NoseKenneth C. Hilty, MD, [facility] Attending Cardiologist [facility]  HILTY,Kenneth C 11/10/2013, 3:34 PM

## 2013-11-13 ENCOUNTER — Emergency Department (HOSPITAL_COMMUNITY): Payer: No Typology Code available for payment source

## 2013-11-13 ENCOUNTER — Encounter (HOSPITAL_COMMUNITY): Payer: Self-pay | Admitting: Emergency Medicine

## 2013-11-13 ENCOUNTER — Emergency Department (HOSPITAL_COMMUNITY)
Admission: EM | Admit: 2013-11-13 | Discharge: 2013-11-13 | Disposition: A | Discharge status: Home / Self Care | Payer: No Typology Code available for payment source | Attending: Emergency Medicine | Admitting: Emergency Medicine

## 2013-11-13 DIAGNOSIS — Z880 Allergy status to penicillin: Secondary | ICD-10-CM | POA: Diagnosis not present

## 2013-11-13 DIAGNOSIS — J3489 Other specified disorders of nose and nasal sinuses: Secondary | ICD-10-CM | POA: Insufficient documentation

## 2013-11-13 DIAGNOSIS — G43909 Migraine, unspecified, not intractable, without status migrainosus: Secondary | ICD-10-CM | POA: Diagnosis not present

## 2013-11-13 DIAGNOSIS — Z8619 Personal history of other infectious and parasitic diseases: Secondary | ICD-10-CM | POA: Diagnosis not present

## 2013-11-13 DIAGNOSIS — R079 Chest pain, unspecified: Secondary | ICD-10-CM | POA: Diagnosis present

## 2013-11-13 DIAGNOSIS — I10 Essential (primary) hypertension: Secondary | ICD-10-CM | POA: Insufficient documentation

## 2013-11-13 DIAGNOSIS — M179 Osteoarthritis of knee, unspecified: Secondary | ICD-10-CM | POA: Insufficient documentation

## 2013-11-13 DIAGNOSIS — E669 Obesity, unspecified: Secondary | ICD-10-CM | POA: Insufficient documentation

## 2013-11-13 DIAGNOSIS — Z79899 Other long term (current) drug therapy: Secondary | ICD-10-CM | POA: Diagnosis not present

## 2013-11-13 DIAGNOSIS — R0789 Other chest pain: Secondary | ICD-10-CM | POA: Insufficient documentation

## 2013-11-13 HISTORY — DX: Personal history of other medical treatment: Z92.89

## 2013-11-13 HISTORY — DX: Reserved for inherently not codable concepts without codable children: IMO0001

## 2013-11-13 LAB — BASIC METABOLIC PANEL
Anion gap: 12 (ref 5–15)
BUN: 15 mg/dL (ref 6–23)
CO2: 25 mEq/L (ref 19–32)
CREATININE: 0.69 mg/dL (ref 0.50–1.10)
Calcium: 9.5 mg/dL (ref 8.4–10.5)
Chloride: 107 mEq/L (ref 96–112)
GFR calc Af Amer: >90 mL/min (ref >90)
GLUCOSE: 84 mg/dL (ref 70–99)
Potassium: 4.5 mEq/L (ref 3.7–5.3)
Sodium: 144 mEq/L (ref 137–147)

## 2013-11-13 LAB — I-STAT TROPONIN, ED: Troponin i, poc: 0 ng/mL (ref 0.00–0.08)

## 2013-11-13 LAB — CBC
HEMATOCRIT: 34.9 % — AB (ref 36.0–46.0)
Hemoglobin: 11.7 g/dL — ABNORMAL LOW (ref 12.0–15.0)
MCH: 26.8 pg (ref 26.0–34.0)
MCHC: 33.5 g/dL (ref 30.0–36.0)
MCV: 80 fL (ref 78.0–100.0)
Platelets: 166 10*3/uL (ref 150–400)
RBC: 4.36 MIL/uL (ref 3.87–5.11)
RDW: 13.3 % (ref 11.5–15.5)
WBC: 4.8 10*3/uL (ref 4.0–10.5)

## 2013-11-13 MED ORDER — OXYCODONE-ACETAMINOPHEN 5-325 MG PO TABS: ORAL_TABLET | ORAL | Status: DC

## 2013-11-13 MED ORDER — DIAZEPAM 5 MG PO TABS
5.0000 mg | ORAL_TABLET | Freq: Once | ORAL | Status: AC
  Administered 2013-11-13: 5 mg via ORAL
  Filled 2013-11-13: qty 1

## 2013-11-13 MED ORDER — METHOCARBAMOL 500 MG PO TABS: 1000.0000 mg | ORAL_TABLET | Freq: Four times a day (QID) | ORAL | Status: DC | PRN

## 2013-11-13 MED ORDER — OXYCODONE-ACETAMINOPHEN 5-325 MG PO TABS
2.0000 | ORAL_TABLET | Freq: Once | ORAL | Status: AC
  Administered 2013-11-13: 2 via ORAL
  Filled 2013-11-13: qty 2

## 2013-11-13 NOTE — ED Notes (Signed)
Pt states she started having pain in her mid chest area yesterday. Today it got worse and started radiating up into her neck and into her right shoulder blade and upper back. States she has never had any pain like this before. Pt had congenital heart defect repaired when she was 5. Has had some nausea but denies vomiting/diarrhea. Dizziness yesterday as well but that has not been present today. Pt took tylenol today 10:00 am and ibuprofen at 6:30am with no relief. NAD noted.

## 2013-11-13 NOTE — ED Provider Notes (Signed)
CSN: [id number]     Arrival date & time 11/13/13  1559 History   First MD Initiated Contact with Patient 11/13/13 1752     Chief Complaint  Patient presents with  . Chest Pain  . Nausea  . Back Pain      HPI Pt was seen at 1805. Per pt, c/o gradual onset and persistence of constant right upper chest wall "pain" for the past 3 days. Describes the CP as constant, "sharp," and with intermittent "spasms." Pain radiates into her upper back, and worsens with movement and palpation of the area. Has been associated with runny/stuffy nose and sinus congestion. Endorses her husband was recently dx with pneumonia and she is concerned regarding same. Denies palpitations, no cough, no abd pain, no N/V/D, no rash, no fevers, no injury.    Past Medical History  Diagnosis Date  . Chest pain   . Degenerative joint disease   . Migraine   . Obesity   . Herpes simplex   . Hypertension   . Osteoarthritis of knee   . Migraine without aura, with intractable migraine, so stated, without mention of status migrainosus 07/26/2013  . Normal cardiac stress test 2012  . Hx of echocardiogram 2012    normal   Past Surgical History  Procedure Laterality Date  . Cesarean section    . Knee surgery  2012    right  . Exploration post operative open heart    . Cardiovascular stress test      negative  . Asd repair  [id number]  . Carpal tunnel release     Family History  Problem Relation Age of Onset  . Coronary artery disease Mother 28  . Stroke Mother   . Migraines Mother   . Other       There is also history of diabetes, stroke, and cancer in the family  . Migraines Sister    History  Substance Use Topics  . Smoking status: Never Smoker   . Smokeless tobacco: Never Used  . Alcohol Use: Yes     Comment: occasional    Review of Systems ROS: Statement: All systems negative except as marked or noted in the HPI; Constitutional: Negative for fever and chills. ; ; Eyes: Negative for eye pain, redness and  discharge. ; ; ENMT: Negative for ear pain, hoarseness, sore throat. +nasal congestion, sinus pressure. ; ; Cardiovascular: Negative for palpitations, diaphoresis, dyspnea and peripheral edema. ; ; Respiratory: Negative for cough, wheezing and stridor. ; ; Gastrointestinal: Negative for nausea, vomiting, diarrhea, abdominal pain, blood in stool, hematemesis, jaundice and rectal bleeding. . ; ; Genitourinary: Negative for dysuria, flank pain and hematuria. ; ; Musculoskeletal: +right upper chest wall pain. Negative for back pain and neck pain. Negative for swelling and trauma.; ; Skin: Negative for pruritus, rash, abrasions, blisters, bruising and skin lesion.; ; Neuro: Negative for headache, lightheadedness and neck stiffness. Negative for weakness, altered level of consciousness , altered mental status, extremity weakness, paresthesias, involuntary movement, seizure and syncope.     Allergies  Amoxicillin; Bee venom; Imitrex; and Vicodin  Home Medications   Prior to Admission medications   Medication Sig Start Date End Date Taking? Authorizing Provider  Ascorbic Acid (VITAMIN C PO) Take 1 tablet by mouth every evening.   Yes Historical Provider, MD  cetirizine (ZYRTEC) 10 MG tablet Take 1 tablet (10 mg total) by mouth daily. One tab daily for allergies 04/23/13  Yes Linna Hoff, MD  Cholecalciferol (VITAMIN D3) 5000 UNITS TABS  Take 5,000 Units by mouth every evening.    Yes Historical Provider, MD  diphenhydrAMINE (SOMINEX) 25 MG tablet Take 25 mg by mouth at bedtime as needed for allergies or sleep.   Yes Historical Provider, MD  ibuprofen (ADVIL,MOTRIN) 200 MG tablet Take 800 mg by mouth every 6 (six) hours as needed for moderate pain.   Yes Historical Provider, MD  magnesium 30 MG tablet Take 30 mg by mouth 2 (two) times daily.   Yes Historical Provider, MD  Omega-3 Fatty Acids (FISH OIL) 1200 MG CPDR Take 1,200 mg by mouth.   Yes Historical Provider, MD  phentermine 37.5 MG capsule Take 37.5  mg by mouth every morning.   Yes Historical Provider, MD  pseudoephedrine (SUDAFED) 30 MG tablet Take 30 mg by mouth every 4 (four) hours as needed for congestion.   Yes Historical Provider, MD  rizatriptan (MAXALT-MLT) 10 MG disintegrating tablet Take 1 tablet (10 mg total) by mouth 3 (three) times daily as needed for migraine. May repeat in 2 hours if needed 07/26/13  Yes York Spaniel, MD  vitamin A 8000 UNIT capsule Take 8,000 Units by mouth daily.   Yes Historical Provider, MD  vitamin E 400 UNIT capsule Take 400 Units by mouth daily.   Yes Historical Provider, MD  methocarbamol (ROBAXIN) 500 MG tablet Take 2 tablets (1,000 mg total) by mouth 4 (four) times daily as needed for muscle spasms (muscle spasm/pain). 11/13/13   Samuel Jester, DO  oxyCODONE-acetaminophen (PERCOCET/ROXICET) 5-325 MG per tablet 1 or 2 tabs PO q6h prn pain 11/13/13   Samuel Jester, DO   BP 135/82  Pulse 48  Temp(Src) 98.3 F (36.8 C)  Resp 18  SpO2 98% Physical Exam 1810: Physical examination:  Nursing notes reviewed; Vital signs and O2 SAT reviewed;  Constitutional: Well developed, Well nourished, Well hydrated, In no acute distress; Head:  Normocephalic, atraumatic; Eyes: EOMI, PERRL, No scleral icterus; ENMT: TM's clear bilat. +edemetous nasal turbinates bilat with clear rhinorrhea. Mouth and pharynx normal, Mucous membranes moist; Neck: Supple, Full range of motion, No lymphadenopathy; Cardiovascular: Bradycardic rate and rhythm, No gallop; Respiratory: Breath sounds clear & equal bilaterally, No rales, rhonchi, wheezes.  Speaking full sentences with ease, Normal respiratory effort/excursion; Chest: +right upper anterior and parasternal chest wall areas tender to palp. No rash, no ecchymosis, no soft tissue crepitus, no deformity. Movement normal; Abdomen: Soft, Nontender, Nondistended, Normal bowel sounds; Genitourinary: No CVA tenderness; Extremities: Pulses normal, No tenderness, No edema, No calf edema or  asymmetry.; Neuro: AA&Ox3, Major CN grossly intact.  Speech clear. No gross focal motor or sensory deficits in extremities. Climbs on and off stretcher easily by herself. Gait steady.; Skin: Color normal, Warm, Dry.    ED Course  Procedures     EKG Interpretation   Date/Time:  Monday November 13 2013 18:47:18 EDT Ventricular Rate:  44 PR Interval:  160 QRS Duration: 113 QT Interval:  475 QTC Calculation: 406 R Axis:   66 Text Interpretation:  Sinus or ectopic atrial bradycardia Borderline  intraventricular conduction delay When compared with ECG of 02/15/2013 No  significant change was found Confirmed by [facility]  MD, Nicholos Johns [phone])  on 11/13/2013 6:53:00 PM      MDM  MDM Reviewed: previous chart, nursing note and vitals Reviewed previous: labs, ECG and ultrasound Interpretation: labs, x-ray and ECG   Results for orders placed during the hospital encounter of 11/13/13  CBC      Result Value Ref Range   WBC 4.8  4.0 - 10.5 K/uL   RBC 4.36  3.87 - 5.11 MIL/uL   Hemoglobin 11.7 (*) 12.0 - 15.0 g/dL   HCT 95.634.9 (*) 21.336.0 - 08.646.0 %   MCV 80.0  78.0 - 100.0 fL   MCH 26.8  26.0 - 34.0 pg   MCHC 33.5  30.0 - 36.0 g/dL   RDW 57.813.3  46.911.5 - 62.915.5 %   Platelets 166  150 - 400 K/uL  BASIC METABOLIC PANEL      Result Value Ref Range   Sodium 144  137 - 147 mEq/L   Potassium 4.5  3.7 - 5.3 mEq/L   Chloride 107  96 - 112 mEq/L   CO2 25  19 - 32 mEq/L   Glucose, Bld 84  70 - 99 mg/dL   BUN 15  6 - 23 mg/dL   Creatinine, Ser 5.280.69  0.50 - 1.10 mg/dL   Calcium 9.5  8.4 - 41.310.5 mg/dL   GFR calc non Af Amer >90  >90 mL/min   GFR calc Af Amer >90  >90 mL/min   Anion gap 12  5 - 15  I-STAT TROPOININ, ED      Result Value Ref Range   Troponin i, poc 0.00  0.00 - 0.08 ng/mL   Comment 3            Dg Chest 2 View 11/13/2013   CLINICAL DATA:  Sharp constant mid chest pain  EXAM: CHEST  2 VIEW  COMPARISON:  02/15/2013  FINDINGS: The heart size and mediastinal contours are within normal  limits. Both lungs are clear. The visualized skeletal structures are unremarkable.  IMPRESSION: No active cardiopulmonary disease.   Electronically Signed   By: Elige KoHetal  Patel   On: 11/13/2013 16:46    2025:  Doubt PE as cause for symptoms with normal d-dimer and low risk [place].  Doubt ACS as cause for symptoms with normal troponin and unchanged EKG from previous after 3 days of constant symptoms. Pt also has reassuring hx of normal echocardiogram and stress test in 2012. Will tx symptomatically at this time for msk pain. Pt states her pain has improved after percocet but "the spasms keep coming." Will dose muscle relaxer while in the ED. Pt states she wants to go home now. Dx and testing d/w pt and family.  Questions answered.  Verb understanding, agreeable to d/c home with outpt f/u.   Samuel JesterKathleen Ivianna Notch, DO 11/15/13 1644

## 2013-11-13 NOTE — ED Notes (Signed)
Per pt chest pain, upper back pain in between shoulders. sts pain on the right side and hurts when she breathes. sts some nausea and HA.

## 2013-11-13 NOTE — Discharge Instructions (Signed)
°Emergency Department Resource Guide °1) Find a Doctor and Pay Out of Pocket °Although you won't have to find out who is covered by your insurance plan, it is a good idea to ask around and get recommendations. You will then need to call the office and see if the doctor you have chosen will accept you as a new patient and what types of options they offer for patients who are self-pay. Some doctors offer discounts or will set up payment plans for their patients who do not have insurance, but you will need to ask so you aren't surprised when you get to your appointment. ° °2) Contact Your Local Health Department °Not all health departments have doctors that can see patients for sick visits, but many do, so it is worth a call to see if yours does. If you don't know where your local health department is, you can check in your phone book. The CDC also has a tool to help you locate your state's health department, and many state websites also have listings of all of their local health departments. ° °3) Find a Walk-in Clinic °If your illness is not likely to be very severe or complicated, you may want to try a walk in clinic. These are popping up all over the country in pharmacies, drugstores, and shopping centers. They're usually staffed by nurse practitioners or physician assistants that have been trained to treat common illnesses and complaints. They're usually fairly quick and inexpensive. However, if you have serious medical issues or chronic medical problems, these are probably not your best option. ° °No Primary Care Doctor: °- Call [facility] at  832-8000 - they can help you locate a primary care doctor that  accepts your insurance, provides certain services, etc. °- Physician Referral Service- 1-800-533-3463 ° °Chronic Pain Problems: °Organization         Address  Phone   Notes  °[facility]  (336) [phone] Patients need to be referred by their primary care doctor.  ° °Medication  Assistance: °Organization         Address  Phone   Notes  °[place] [facility] [address] °[place], [place] [address] (336) 641-8030 --Must be a resident of [place] °-- Must have NO insurance coverage whatsoever (no Medicaid/ Medicare, etc.) °-- The pt. MUST have a primary care doctor that directs their care regularly and follows them in the community °  °[facility]  (866) 331-1348   °[employer]  (888) 892-1162   ° °Agencies that provide inexpensive medical care: °Organization         Address  Phone   Notes  °[facility]  (336) 832-8035   °[facility]    (336) 832-7272   °[facility] [address] °[place], [place] [address] (336) 832-4777   °[facility] [address], °[place] (336) 271-4999   °[facility]    (336) 373-0678   °[facility]    (336) 272-1050   °[facility] ° [address], [place] Phone:  (336) 832-4444, Fax:  (336) 832-4440 Hours of Operation:  9 am - 6 pm, M-F.  Also accepts Medicaid/Medicare and self-pay.  ° [facility] ° [address],  Phone: (336) 832-3150, Fax: (336) [phone]. Hours of Operation:  8:30 am - 5:30 pm, M-F.  Also accepts Medicaid and self-pay.  °[facility] [place] [address], [place] Phone: (336) 878-6027   °[facility] [address], [place], [place] (336)723-1848, Ext. 123 Mondays & Thursdays: 7-9 AM.  First 15 patients are seen on a first come, first serve basis. °  ° °Medicaid-accepting [place] Providers: ° °Organization         Address  Phone   Notes  °[facility] [address], [place] (336) 641-2100 Also accepts self-pay patients.  °[facility] [address], [place] ° (336) 856-9996   °[facility] [address], [place]  (336) 288-8857   °[facility] [address] [place] [address], [place] (336) 299-7000   °Veita Bland [address], [place]  ° (336) 373-1557 Only accepts [place] Access Medicaid patients after they have their name applied to their card.  ° °Self-Pay (no insurance) in [place]: ° °Organization         Address  Phone   Notes  °Sickle Cell Patients, [facility] [address], [place] (336) 832-1970   °[facility] [address], [place] (336) 832-4400   °[facility] ° [address], [place] (336) 992-4800   °[facility]/Dr. Osei-Bonsu ° [address] [place] [address], [place] or [address], [place] (336) [phone] Phone number for both [place] and [place] locations is the same.  °[facility] [address], [place] (336) 299-0000   °[facility] [address] [place] [address], [place] or [address] (336) 852-7530 °(336) 878-2260   °[facility] [address], [place] (336) 350-1642, phone; (336) 294-5005, fax Sees patients 1st and 3rd Saturday of every month.  Must not qualify for public or private insurance (i.e. Medicaid, Medicare, [place] Health Choice, Veterans' Benefits) • Household income should be no more than 200% of the poverty level •The clinic cannot treat you if you are pregnant or think you are pregnant • Sexually transmitted diseases are not treated at the clinic.  ° ° °Dental Care: °Organization         Address  Phone  Notes  °[place] [facility] [address], [place] (336) 641-6152 Accepts children up to age 21 who are enrolled in Medicaid or [place] Health Choice; pregnant women with a Medicaid card; and children who have applied for Medicaid or [place] Health Choice, but were declined, whose parents can pay a reduced fee at time of service.  °[place]  [facility] [place]  [address], [place] (336) 641-7733 Accepts children up to age 21 who are enrolled in Medicaid or [place] Health Choice; pregnant women with a Medicaid card; and children who have applied for Medicaid or [place] Health Choice, but were declined, whose parents can pay a reduced fee at time of service.  °[facility] ° [address],  (336) 641-4533 Patients are seen by appointment only. Walk-ins are not accepted. [facility] will see patients 18 years of age and older. °Monday - Tuesday (8am-5pm) °Most Wednesdays (8:30-5pm) °$30 per visit, cash only  °[facility] ° [address], [place] (336) 641-4533 Patients are seen by appointment only. Walk-ins are not accepted. [facility] will see patients 18 years of age and older. °One   Wednesday Evening (Monthly: Volunteer Based).  $30 per visit, cash only  °[facility]  (919) 537-3737 for adults; Children under age 4, call Graduate Pediatric Dentistry at ([phone]) [phone]. Children aged 4-14, please call (919) [phone] to request a pediatric application. ° Dental services are provided in all areas of dental care including fillings, crowns and bridges, complete and partial dentures, implants, gum treatment, root canals, and extractions. Preventive care is also provided. Treatment is provided to both adults and children. °Patients are selected via a lottery and there is often a waiting list. °  °[facility] [address], °[place] ° (336) 763-8833 [email or web address] °  °[facility] [address], [place], [place] (336)723-1848, Ext. 123 Second and Fourth Thursday of each month, opens at 6:30 AM; Clinic ends at 9 AM.  Patients are seen on a first-come first-served basis, and a limited number are seen during each clinic.  ° °[facility] ° [address], [place], [place] (336) 723-7904    Eligibility Requirements °You must have lived in [place], [place], or [place] counties for at least the last three months. °  You cannot be eligible for state or federal sponsored healthcare insurance, including Veterans Administration, Medicaid, or Medicare. °  You generally cannot be eligible for healthcare insurance through your employer.  °  How to apply: °Eligibility screenings are held every Tuesday and Wednesday afternoon from 1:00 pm until 4:00 pm. You do not need an appointment for the interview!  °[facility] [address], [place], [place] 336-631-2330   °[facility]  336-342-8273   °[facility]  336-703-3100   °[facility]  336-570-6415   ° °Behavioral Health Resources in the Community: °Intensive Outpatient Programs °Organization         Address  Phone  Notes  °[facility] [address], [place], [place] 336-878-6098   °[facility] [address], [place], [place] 336-832-9800   °[facility] [address], [place], [place] ° 336-882-2125   °[facility] [address],  °[place], [place] 1-800-853-5163 or 336-641-4981   °Substance Abuse Resources °Organization         Address  Phone  Notes  °[facility]  336-882-2125   °[facility]  336-784-9470   °[facility]  336-285-9073   °[facility]  336-845-3988   °Residential & Outpatient Substance Abuse Program  1-800-659-3381   °Psychological Services °Organization         Address  Phone  Notes  °[facility]  336- 832-9600   °[facility]  336- 378-7881   °[facility] [address], [place] 1-800-853-5163 or 336-641-4981   ° °Mobile Crisis Teams °Organization         Address  Phone  Notes  °[facility], Mobile Crisis Care Unit  1-877-626-1772   °Assertive °[facility] ° [address]  [place], [place] 336-834-9664   °Sharon DeEsch [address] °[place] 336-554-5454   ° °Self-Help/Support Groups °Organization         Address  Phone             Notes  °[facility] - variety of support groups  336- 373-1402 Call for more information  °Narcotics Anonymous (NA), [facility] [address], °[place]  2 meetings at this location  ° °  Residential Treatment Programs °Organization         Address  Phone  Notes  °[facility] [address],    °[place]  1-866-801-8205   °[facility] ° [address], [place], [place] 704-293-8524   °[facility] [address], [place] 336-845-3988 Admissions: 8am-3pm M-F  °[facility] [address]    °[place], [place] 336-841-1104   °[facility] [address], [place], [place] 336-379-7146   °[facility] [address]  °[place], [place] 336-285-9073   °[facility] Intensive Outpatient [address], [place], [place] 336-852-3033   °[facility] [address]  °[place], [place] 1-877-615-2722 or 336-784-9470   °[facility] [address] [place], [place] 336-227-7417 Accepts Medicaid  °[facility] [address]  °[place] 1-800-659-3381 Substance Abuse/Addiction Treatment  ° °[place] Behavioral Health Resources °Organization         Address  Phone  Notes  °[facility]  (888) 581-9988   °Julie Brannon, PhD [address] [place], [place]   (336) 349-5553 or (336) 951-0000   °[facility]   [address] °[place], [place] (336) 349-4454   °[facility] [address], [place], [place] (336) 342-8316 Insurance/Medicaid/sponsorship through [facility]  °[facility] [address]                                    [place], [place] (336) 342-8316 Therapy/tele-psych/case    °[facility] [address]  ° [place], [place] (336) 349-2233    °Dr. Arfeen  (336) 349-4544   °[facility] [place]  [employer] [place] [facility] 1) [address],  °2) [address], [place] °3)  [address], [place] (336) 349-3220 °(336) 342-7768 ° °(336) 342-8140   °[place] [facility] (336) 342-1394 or (336) 342-3537 (After Hours)    ° °Take the prescriptions as directed.  Apply moist heat or ice to the area(s) of discomfort, for 15 minutes at a time, several times per day for the next few days.  Do not fall asleep on a heating or ice pack.  Call your regular medical doctor tomorrow to schedule a follow up appointment in the next 2 days.  Return to the Emergency Department immediately if worsening. ° °

## 2013-11-15 ENCOUNTER — Telehealth: Payer: Self-pay | Admitting: Internal Medicine

## 2013-11-15 NOTE — Telephone Encounter (Signed)
Closed encounter °

## 2013-12-05 ENCOUNTER — Other Ambulatory Visit (HOSPITAL_COMMUNITY): Payer: Self-pay | Admitting: Orthopedic Surgery

## 2013-12-05 DIAGNOSIS — M2341 Loose body in knee, right knee: Secondary | ICD-10-CM

## 2013-12-08 ENCOUNTER — Telehealth (HOSPITAL_COMMUNITY): Payer: Self-pay

## 2013-12-08 NOTE — Telephone Encounter (Signed)
Encounter complete. 

## 2013-12-13 ENCOUNTER — Ambulatory Visit (HOSPITAL_COMMUNITY): Payer: No Typology Code available for payment source

## 2013-12-13 ENCOUNTER — Encounter (HOSPITAL_COMMUNITY): Payer: No Typology Code available for payment source

## 2013-12-14 ENCOUNTER — Encounter (HOSPITAL_COMMUNITY): Payer: No Typology Code available for payment source

## 2013-12-15 ENCOUNTER — Encounter (HOSPITAL_COMMUNITY): Payer: No Typology Code available for payment source

## 2013-12-22 ENCOUNTER — Ambulatory Visit: Payer: No Typology Code available for payment source | Admitting: Internal Medicine

## 2013-12-25 ENCOUNTER — Ambulatory Visit (HOSPITAL_COMMUNITY): Payer: No Typology Code available for payment source

## 2014-01-31 ENCOUNTER — Telehealth (HOSPITAL_COMMUNITY): Payer: Self-pay

## 2014-01-31 NOTE — Telephone Encounter (Signed)
Encounter complete. 

## 2014-02-06 ENCOUNTER — Telehealth (HOSPITAL_COMMUNITY): Payer: Self-pay

## 2014-02-06 NOTE — Telephone Encounter (Signed)
Encounter complete. 

## 2014-02-07 ENCOUNTER — Encounter (HOSPITAL_COMMUNITY): Payer: No Typology Code available for payment source

## 2014-02-07 ENCOUNTER — Ambulatory Visit (HOSPITAL_COMMUNITY): Payer: No Typology Code available for payment source

## 2014-11-09 ENCOUNTER — Encounter (HOSPITAL_COMMUNITY): Payer: Self-pay | Admitting: Emergency Medicine

## 2014-11-09 ENCOUNTER — Emergency Department (INDEPENDENT_AMBULATORY_CARE_PROVIDER_SITE_OTHER)
Admission: EM | Admit: 2014-11-09 | Discharge: 2014-11-09 | Disposition: A | Discharge status: Home / Self Care | Payer: Self-pay | Source: Home / Self Care | Attending: Family Medicine | Admitting: Family Medicine

## 2014-11-09 DIAGNOSIS — J069 Acute upper respiratory infection, unspecified: Secondary | ICD-10-CM

## 2014-11-09 LAB — POCT RAPID STREP A: Streptococcus, Group A Screen (Direct): NEGATIVE

## 2014-11-09 MED ORDER — FLUTICASONE PROPIONATE 50 MCG/ACT NA SUSP: 2.0000 | Freq: Every day | NASAL | Status: DC

## 2014-11-09 MED ORDER — IPRATROPIUM BROMIDE 0.06 % NA SOLN: 2.0000 | Freq: Four times a day (QID) | NASAL | Status: DC

## 2014-11-09 NOTE — ED Provider Notes (Signed)
CSN: [id number]     Arrival date & time 11/09/14  1940 History   First MD Initiated Contact with Patient 11/09/14 2002     Chief Complaint  Patient presents with  . Sore Throat   (Consider location/radiation/quality/duration/timing/severity/associated sxs/prior Treatment) HPI  Sore throat  Started 1 day ago.  Shooting pain in neck Associated w/ cough and congestion OTC cold meds w/o imrpovement.  Son w/ strep throat last week.  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    Past Medical History  Diagnosis Date  . Chest pain   . Degenerative joint disease   . Migraine   . Obesity   . Herpes simplex   . Hypertension   . Osteoarthritis of knee   . Migraine without aura, with intractable migraine, so stated, without mention of status migrainosus 07/26/2013  . Normal cardiac stress test 2012  . Hx of echocardiogram 2012    normal   Past Surgical History  Procedure Laterality Date  . Cesarean section    . Knee surgery  2012    right  . Exploration post operative open heart    . Cardiovascular stress test      negative  . Asd repair  [id number]  . Carpal tunnel release     Family History  Problem Relation Age of Onset  . Coronary artery disease Mother 14  . Stroke Mother   . Migraines Mother   . Other       There is also history of diabetes, stroke, and cancer in the family  . Migraines Sister    Social History  Substance Use Topics  . Smoking status: Never Smoker   . Smokeless tobacco: Never Used  . Alcohol Use: Yes     Comment: occasional   OB History    No data available     Review of Systems Per HPI with all other pertinent systems negative.   Allergies  Amoxicillin; Bee venom; Imitrex; and Vicodin  Home Medications   Prior to Admission medications   Medication Sig Start Date End Date Taking? Authorizing Provider  Ascorbic Acid  (VITAMIN C PO) Take 1 tablet by mouth every evening.    Historical Provider, MD  cetirizine (ZYRTEC) 10 MG tablet Take 1 tablet (10 mg total) by mouth daily. One tab daily for allergies 04/23/13   Linna Hoff, MD  Cholecalciferol (VITAMIN D3) 5000 UNITS TABS Take 5,000 Units by mouth every evening.     Historical Provider, MD  diphenhydrAMINE (SOMINEX) 25 MG tablet Take 25 mg by mouth at bedtime as needed for allergies or sleep.    Historical Provider, MD  fluticasone (FLONASE) 50 MCG/ACT nasal spray Place 2 sprays into both nostrils at bedtime. 11/09/14   Ozella Rocks, MD  ibuprofen (ADVIL,MOTRIN) 200 MG tablet Take 800 mg by mouth every 6 (six) hours as needed for moderate pain.    Historical Provider, MD  ipratropium (ATROVENT) 0.06 % nasal spray Place 2 sprays into both nostrils 4 (four) times daily. 11/09/14   Ozella Rocks, MD  magnesium 30 MG tablet Take 30 mg by mouth 2 (two) times daily.    Historical Provider, MD  methocarbamol (ROBAXIN) 500 MG tablet Take 2 tablets (1,000 mg total) by mouth 4 (four) times daily as needed for muscle spasms (muscle spasm/pain). 11/13/13   Samuel Jester, DO  Omega-3 Fatty Acids (FISH OIL) 1200 MG CPDR Take 1,200 mg by mouth.  Historical Provider, MD  oxyCODONE-acetaminophen (PERCOCET/ROXICET) 5-325 MG per tablet 1 or 2 tabs PO q6h prn pain 11/13/13   Samuel Jester, DO  phentermine 37.5 MG capsule Take 37.5 mg by mouth every morning.    Historical Provider, MD  pseudoephedrine (SUDAFED) 30 MG tablet Take 30 mg by mouth every 4 (four) hours as needed for congestion.    Historical Provider, MD  rizatriptan (MAXALT-MLT) 10 MG disintegrating tablet Take 1 tablet (10 mg total) by mouth 3 (three) times daily as needed for migraine. May repeat in 2 hours if needed 07/26/13   York Spaniel, MD  vitamin A 8000 UNIT capsule Take 8,000 Units by mouth daily.    Historical Provider, MD  vitamin E 400 UNIT capsule Take 400 Units by mouth daily.    Historical  Provider, MD   Meds Ordered and Administered this Visit  Medications - No data to display  BP 165/94 mmHg  Pulse 47  Temp(Src) 98.2 F (36.8 C) (Oral)  Resp 16  SpO2 98% No data found.   Physical Exam Physical Exam  Constitutional: oriented to person, place, and time. appears well-developed and well-nourished. No distress.  HENT:  Head: Normocephalic and atraumatic.  Pharyngeal injection without tonsillar hypertrophy or exudate. Eyes: EOMI. PERRL.  Neck: Normal range of motion.  Cardiovascular: RRR, no m/r/g, 2+ distal pulses,  Pulmonary/Chest: Effort normal and breath sounds normal. No respiratory distress.  Abdominal: Soft. Bowel sounds are normal. NonTTP, no distension.  Musculoskeletal: Normal range of motion. Non ttp, no effusion.  Neurological: alert and oriented to person, place, and time.  Skin: Skin is warm. No rash noted. non diaphoretic.  Psychiatric: normal mood and affect. behavior is normal. Judgment and thought content normal.   ED Course  Procedures (including critical care time)  Labs Review Labs Reviewed  POCT RAPID STREP A    Imaging Review No results found.   Visual Acuity Review  Right Eye Distance:   Left Eye Distance:   Bilateral Distance:    Right Eye Near:   Left Eye Near:    Bilateral Near:         MDM   1. Viral URI    Our URI. Strep test negative. We'll send strep culture. Start ibuprofen, Atrovent, Flonase, Zyrtec. Supportive measures encouraged.    Ozella Rocks, MD 11/09/14 [phone]

## 2014-11-09 NOTE — Discharge Instructions (Signed)
You're likely suffering from a viral illness causing postnasal drip and throat irritation. This may get worse over the next couple days but she doesn't rapidly improve. Your initial strep test was negative. Will itching of her second strep test is positive. Please begin using the following combination of medicines: Ibuprofen 600 mg to 800 mg every 6-8 hours, Mucinex, Flonase nasal congestion, nasal Atrovent for runny nose and postnasal drip, a nightly allergy pill such as Zyrtec Allegra or Claritin.

## 2014-11-09 NOTE — ED Notes (Signed)
The patient presented to the [facility] with a complaint of a sore throat that started yesterday. She stated that her son was recently diagnosed with strep throat.

## 2014-11-12 LAB — CULTURE, GROUP A STREP: STREP A CULTURE: NEGATIVE

## 2014-11-12 NOTE — ED Notes (Signed)
Final report of strep testing negative  

## 2015-03-22 ENCOUNTER — Encounter (HOSPITAL_COMMUNITY): Payer: Self-pay | Admitting: *Deleted

## 2015-03-22 ENCOUNTER — Emergency Department (HOSPITAL_COMMUNITY)
Admission: EM | Admit: 2015-03-22 | Discharge: 2015-03-22 | Disposition: A | Discharge status: Home / Self Care | Payer: No Typology Code available for payment source | Attending: Emergency Medicine | Admitting: Emergency Medicine

## 2015-03-22 DIAGNOSIS — Z79899 Other long term (current) drug therapy: Secondary | ICD-10-CM | POA: Insufficient documentation

## 2015-03-22 DIAGNOSIS — J0110 Acute frontal sinusitis, unspecified: Secondary | ICD-10-CM | POA: Insufficient documentation

## 2015-03-22 DIAGNOSIS — Z880 Allergy status to penicillin: Secondary | ICD-10-CM | POA: Insufficient documentation

## 2015-03-22 DIAGNOSIS — G43019 Migraine without aura, intractable, without status migrainosus: Secondary | ICD-10-CM | POA: Insufficient documentation

## 2015-03-22 DIAGNOSIS — I10 Essential (primary) hypertension: Secondary | ICD-10-CM | POA: Insufficient documentation

## 2015-03-22 DIAGNOSIS — H748X3 Other specified disorders of middle ear and mastoid, bilateral: Secondary | ICD-10-CM | POA: Insufficient documentation

## 2015-03-22 DIAGNOSIS — E669 Obesity, unspecified: Secondary | ICD-10-CM | POA: Insufficient documentation

## 2015-03-22 LAB — CBC
HCT: 37.9 % (ref 36.0–46.0)
Hemoglobin: 12.4 g/dL (ref 12.0–15.0)
MCH: 26.9 pg (ref 26.0–34.0)
MCHC: 32.7 g/dL (ref 30.0–36.0)
MCV: 82.2 fL (ref 78.0–100.0)
PLATELETS: 184 10*3/uL (ref 150–400)
RBC: 4.61 MIL/uL (ref 3.87–5.11)
RDW: 13.5 % (ref 11.5–15.5)
WBC: 5.8 10*3/uL (ref 4.0–10.5)

## 2015-03-22 LAB — BASIC METABOLIC PANEL
Anion gap: 9 (ref 5–15)
BUN: 15 mg/dL (ref 6–20)
CALCIUM: 9.7 mg/dL (ref 8.9–10.3)
CO2: 27 mmol/L (ref 22–32)
Chloride: 104 mmol/L (ref 101–111)
Creatinine, Ser: 0.62 mg/dL (ref 0.44–1.00)
GFR calc Af Amer: >60 mL/min (ref >60)
Glucose, Bld: 114 mg/dL — ABNORMAL HIGH (ref 65–99)
Potassium: 4.5 mmol/L (ref 3.5–5.1)
SODIUM: 140 mmol/L (ref 135–145)

## 2015-03-22 LAB — URINE MICROSCOPIC-ADD ON

## 2015-03-22 LAB — URINALYSIS, ROUTINE W REFLEX MICROSCOPIC
Bilirubin Urine: NEGATIVE
Glucose, UA: NEGATIVE mg/dL
Hgb urine dipstick: NEGATIVE
Ketones, ur: NEGATIVE mg/dL
Nitrite: NEGATIVE
PROTEIN: NEGATIVE mg/dL
Specific Gravity, Urine: 1.006 (ref 1.005–1.030)
pH: 5.5 (ref 5.0–8.0)

## 2015-03-22 MED ORDER — MECLIZINE HCL 25 MG PO TABS: 25.0000 mg | ORAL_TABLET | Freq: Three times a day (TID) | ORAL | Status: DC | PRN

## 2015-03-22 MED ORDER — METOCLOPRAMIDE HCL 5 MG/ML IJ SOLN
10.0000 mg | Freq: Once | INTRAMUSCULAR | Status: AC
  Administered 2015-03-22: 10 mg via INTRAVENOUS
  Filled 2015-03-22: qty 2

## 2015-03-22 MED ORDER — DIPHENHYDRAMINE HCL 50 MG/ML IJ SOLN
25.0000 mg | Freq: Once | INTRAMUSCULAR | Status: AC
  Administered 2015-03-22: 25 mg via INTRAVENOUS
  Filled 2015-03-22: qty 1

## 2015-03-22 MED ORDER — DEXAMETHASONE SODIUM PHOSPHATE 10 MG/ML IJ SOLN
10.0000 mg | Freq: Once | INTRAMUSCULAR | Status: AC
  Administered 2015-03-22: 10 mg via INTRAVENOUS
  Filled 2015-03-22: qty 1

## 2015-03-22 MED ORDER — AZITHROMYCIN 250 MG PO TABS
250.0000 mg | ORAL_TABLET | Freq: Every day | ORAL | Status: DC
Start: 2015-03-22 — End: 2015-05-06

## 2015-03-22 MED ORDER — OXYMETAZOLINE HCL 0.05 % NA SOLN
1.0000 | Freq: Once | NASAL | Status: AC
  Administered 2015-03-22: 1 via NASAL
  Filled 2015-03-22: qty 15

## 2015-03-22 MED ORDER — SODIUM CHLORIDE 0.9 % IV BOLUS (SEPSIS)
1000.0000 mL | Freq: Once | INTRAVENOUS | Status: AC
  Administered 2015-03-22: 1000 mL via INTRAVENOUS

## 2015-03-22 NOTE — Discharge Instructions (Signed)
Recurrent Migraine Headache A migraine headache is an intense, throbbing pain on one or both sides of your head. Recurrent migraines keep coming back. A migraine can last for 30 minutes to several hours. CAUSES  The exact cause of a migraine headache is not always known. However, a migraine may be caused when nerves in the brain become irritated and release chemicals that cause inflammation. This causes pain. Certain things may also trigger migraines, such as:   Alcohol.  Smoking.  Stress.  Menstruation.  Aged cheeses.  Foods or drinks that contain nitrates, glutamate, aspartame, or tyramine.  Lack of sleep.  Chocolate.  Caffeine.  Hunger.  Physical exertion.  Fatigue.  Medicines used to treat chest pain (nitroglycerine), birth control pills, estrogen, and some blood pressure medicines. SYMPTOMS   Pain on one or both sides of your head.  Pulsating or throbbing pain.  Severe pain that prevents daily activities.  Pain that is aggravated by any physical activity.  Nausea, vomiting, or both.  Dizziness.  Pain with exposure to bright lights, loud noises, or activity.  General sensitivity to bright lights, loud noises, or smells. Before you get a migraine, you may get warning signs that a migraine is coming (aura). An aura may include:  Seeing flashing lights.  Seeing bright spots, halos, or zigzag lines.  Having tunnel vision or blurred vision.  Having feelings of numbness or tingling.  Having trouble talking.  Having muscle weakness. DIAGNOSIS  A recurrent migraine headache is often diagnosed based on:  Symptoms.  Physical examination.  A CT scan or MRI of your head. These imaging tests cannot diagnose migraines but can help rule out other causes of headaches.  TREATMENT  Medicines may be given for pain and nausea. Medicines can also be given to help prevent recurrent migraines. HOME CARE INSTRUCTIONS  Only take over-the-counter or prescription  medicines for pain or discomfort as directed by your health care provider. The use of long-term narcotics is not recommended.  Lie down in a dark, quiet room when you have a migraine.  Keep a journal to find out what may trigger your migraine headaches. For example, write down:  What you eat and drink.  How much sleep you get.  Any change to your diet or medicines.  Limit alcohol consumption.  Quit smoking if you smoke.  Get 7-9 hours of sleep, or as recommended by your health care provider.  Limit stress.  Keep lights dim if bright lights bother you and make your migraines worse. SEEK MEDICAL CARE IF:   You do not get relief from the medicines given to you.  You have a recurrence of pain.  You have a fever. SEEK IMMEDIATE MEDICAL CARE IF:  Your migraine becomes severe.  You have a stiff neck.  You have loss of vision.  You have muscular weakness or loss of muscle control.  You start losing your balance or have trouble walking.  You feel faint or pass out.  You have severe symptoms that are different from your first symptoms. MAKE SURE YOU:   Understand these instructions.  Will watch your condition.  Will get help right away if you are not doing well or get worse.   This information is not intended to replace advice given to you by your health care provider. Make sure you discuss any questions you have with your health care provider.   Document Released: 10/21/2000 Document Revised: 02/16/2014 Document Reviewed: 10/03/2012 Elsevier Interactive Patient Education 2016 [employer].  Sinus Rinse WHAT  IS A SINUS RINSE? A sinus rinse is a home treatment. It rinses your sinuses with a mixture of salt and water (saline solution). Sinuses are air-filled spaces in your skull behind the bones of your face and forehead. They open into your nasal cavity. To do a sinus rinse, you will need:  Saline solution.  Neti pot or spray bottle. This releases the saline  solution into your nose and through your sinuses. You can buy neti pots and spray bottles at:  Your local pharmacy.  A health food store.  Online. WHEN WOULD I DO A SINUS RINSE?  A sinus rinse can help to clear your nasal cavity. It can clear:   Mucus.  Dirt.  Dust.  Pollen. You may do a sinus rinse when you have:  A cold.  A virus.  Allergies.  A sinus infection.  A stuffy nose. If you are considering a sinus rinse:  Ask your child's doctor before doing a sinus rinse on your child.  Do not do a sinus rinse if you have had:  Ear or nasal surgery.  An ear infection.  Blocked ears. HOW DO I DO A SINUS RINSE?   Wash your hands.  Disinfect your device using the directions that came with the device.  Dry your device.  Use the solution that comes with your device or one that is sold separately in stores. Follow the mixing directions on the package.  Fill your device with the amount of saline solution as stated in the device instructions.  Stand over a sink and tilt your head sideways over the sink.  Place the spout of the device in your upper nostril (the one closer to the ceiling).  Gently pour or squeeze the saline solution into the nasal cavity. The liquid should drain to the lower nostril if you are not too congested.  Gently blow your nose. Blowing too hard may cause ear pain.  Repeat in the other nostril.  Clean and rinse your device with clean water.  Air-dry your device. ARE THERE RISKS OF A SINUS RINSE?  Sinus rinse is normally very safe and helpful. However, there are a few risks, which include:   A burning feeling in the sinuses. This may happen if you do not make the saline solution as instructed. Make sure to follow all directions when making the saline solution.  Infection from unclean water. This is rare, but possible.  Nasal irritation.   This information is not intended to replace advice given to you by your health care provider.  Make sure you discuss any questions you have with your health care provider.   Document Released: 08/23/2013 Document Reviewed: 08/23/2013 Elsevier Interactive Patient Education [employer]

## 2015-03-22 NOTE — ED Provider Notes (Addendum)
CSN: [id number]     Arrival date & time 03/22/15  1042 History   First MD Initiated Contact with Patient 03/22/15 1151     Chief Complaint  Patient presents with  . Migraine     (Consider location/radiation/quality/duration/timing/severity/associated sxs/prior Treatment) Patient is a 45 y.o. female presenting with migraines. The history is provided by the patient.  Migraine This is a recurrent problem. Episode onset: 1 week. The problem occurs constantly. The problem has not changed since onset.Associated symptoms comments: She states over the last 2 weeks she's had significant sinus pressure, rhinorrhea, sore throat, voice changes, cough and yesterday and today she had symptoms of dizziness which was worse when she was standing or moving her head. He has had intermittent migraine headaches which she has had for years that her migraine medication is not working at home. She denies fever or neck pain. Occasionally she will feel chest congestion with coughing but no localized pain or shortness of breath.. The symptoms are aggravated by walking. Nothing relieves the symptoms. Treatments tried: Migraine medication and OTC cold medicines such as Sudafed. The treatment provided no relief.    Past Medical History  Diagnosis Date  . Chest pain   . Degenerative joint disease   . Migraine   . Obesity   . Herpes simplex   . Hypertension   . Osteoarthritis of knee   . Migraine without aura, with intractable migraine, so stated, without mention of status migrainosus 07/26/2013  . Normal cardiac stress test 2012  . Hx of echocardiogram 2012    normal   Past Surgical History  Procedure Laterality Date  . Cesarean section    . Knee surgery  2012    right  . Exploration post operative open heart    . Cardiovascular stress test      negative  . Asd repair  [id number]  . Carpal tunnel release     Family History  Problem Relation Age of Onset  . Coronary artery disease Mother 35  . Stroke Mother   .  Migraines Mother   . Other       There is also history of diabetes, stroke, and cancer in the family  . Migraines Sister    Social History  Substance Use Topics  . Smoking status: Never Smoker   . Smokeless tobacco: Never Used  . Alcohol Use: Yes     Comment: occasional   OB History    No data available     Review of Systems  All other systems reviewed and are negative.     Allergies  Amoxicillin; Bee venom; Imitrex; and Vicodin  Home Medications   Prior to Admission medications   Medication Sig Start Date End Date Taking? Authorizing Provider  Ascorbic Acid (VITAMIN C PO) Take 1 tablet by mouth every evening.    Historical Provider, MD  cetirizine (ZYRTEC) 10 MG tablet Take 1 tablet (10 mg total) by mouth daily. One tab daily for allergies 04/23/13   Linna Hoff, MD  Cholecalciferol (VITAMIN D3) 5000 UNITS TABS Take 5,000 Units by mouth every evening.     Historical Provider, MD  diphenhydrAMINE (SOMINEX) 25 MG tablet Take 25 mg by mouth at bedtime as needed for allergies or sleep.    Historical Provider, MD  fluticasone (FLONASE) 50 MCG/ACT nasal spray Place 2 sprays into both nostrils at bedtime. 11/09/14   Ozella Rocks, MD  ibuprofen (ADVIL,MOTRIN) 200 MG tablet Take 800 mg by mouth every 6 (six) hours as needed  for moderate pain.    Historical Provider, MD  ipratropium (ATROVENT) 0.06 % nasal spray Place 2 sprays into both nostrils 4 (four) times daily. 11/09/14   Ozella Rocks, MD  methocarbamol (ROBAXIN) 500 MG tablet Take 2 tablets (1,000 mg total) by mouth 4 (four) times daily as needed for muscle spasms (muscle spasm/pain). 11/13/13   Samuel Jester, DO  Omega-3 Fatty Acids (FISH OIL) 1200 MG CPDR Take 1,200 mg by mouth.    Historical Provider, MD  oxyCODONE-acetaminophen (PERCOCET/ROXICET) 5-325 MG per tablet 1 or 2 tabs PO q6h prn pain 11/13/13   Samuel Jester, DO  phentermine 37.5 MG capsule Take 37.5 mg by mouth every morning.    Historical Provider, MD   pseudoephedrine (SUDAFED) 30 MG tablet Take 30 mg by mouth every 4 (four) hours as needed for congestion.    Historical Provider, MD  rizatriptan (MAXALT-MLT) 10 MG disintegrating tablet Take 1 tablet (10 mg total) by mouth 3 (three) times daily as needed for migraine. May repeat in 2 hours if needed 07/26/13   York Spaniel, MD  vitamin A 8000 UNIT capsule Take 8,000 Units by mouth daily.    Historical Provider, MD  vitamin E 400 UNIT capsule Take 400 Units by mouth daily.    Historical Provider, MD   BP 141/81 mmHg  Pulse 50  Temp(Src) 97.9 F (36.6 C) (Oral)  Resp 18  SpO2 96% Physical Exam  Constitutional: She is oriented to person, place, and time. She appears well-developed and well-nourished. No distress.  HENT:  Head: Normocephalic and atraumatic.  Right Ear: A middle ear effusion is present.  Left Ear: A middle ear effusion is present.  Nose: Mucosal edema and rhinorrhea present.  Mouth/Throat: Posterior oropharyngeal erythema present. No oropharyngeal exudate or posterior oropharyngeal edema.  Eyes: Conjunctivae and EOM are normal. Pupils are equal, round, and reactive to light.  No nystagmus  Neck: Normal range of motion. Neck supple.  Cardiovascular: Normal rate, regular rhythm and intact distal pulses.   No murmur heard. Pulmonary/Chest: Effort normal and breath sounds normal. No respiratory distress. She has no wheezes. She has no rales.  Abdominal: Soft. She exhibits no distension. There is no tenderness. There is no rebound and no guarding.  Musculoskeletal: Normal range of motion. She exhibits no edema or tenderness.  Lymphadenopathy:    She has cervical adenopathy.  Neurological: She is alert and oriented to person, place, and time. She has normal strength. No sensory deficit.  Skin: Skin is warm and dry. No rash noted. No erythema.  Psychiatric: She has a normal mood and affect. Her behavior is normal.  Nursing note and vitals reviewed.   ED Course   Procedures (including critical care time) Labs Review Labs Reviewed  BASIC METABOLIC PANEL - Abnormal; Notable for the following:    Glucose, Bld 114 (*)    All other components within normal limits  URINALYSIS, ROUTINE W REFLEX MICROSCOPIC (NOT AT [facility]) - Abnormal; Notable for the following:    Leukocytes, UA SMALL (*)    All other components within normal limits  URINE MICROSCOPIC-ADD ON - Abnormal; Notable for the following:    Squamous Epithelial / LPF 0-5 (*)    Bacteria, UA FEW (*)    All other components within normal limits  CBC  CBG MONITORING, ED    Imaging Review No results found. I have personally reviewed and evaluated these images and lab results as part of my medical decision-making.   EKG Interpretation None  MDM   Final diagnoses:  Intractable migraine without aura and without status migrainosus  Acute frontal sinusitis, recurrence not specified    Pt with typical migraine HA which is most likely being exacerbated by acute sinusitis has been ongoing now for 2 weeks without sx suggestive of SAH(sudden onset, worst of life, or deficits), infection, or cavernous vein thrombosis.  Normal neuro exam and vital signs. Will give HA cocktail and re-eval.  2:48 PM Pt feeling much better will d/c home.  Gwyneth Sprout, MD 03/22/15 1448  Gwyneth Sprout, MD 03/22/15 1451

## 2015-03-22 NOTE — ED Notes (Signed)
Pt reports migraine and earaches. Pt states that she has a cough and some lightheadedness as well. Reports this has been ongoing for 2 days.

## 2015-04-04 ENCOUNTER — Encounter (HOSPITAL_COMMUNITY): Payer: Self-pay | Admitting: Emergency Medicine

## 2015-04-04 ENCOUNTER — Emergency Department (HOSPITAL_COMMUNITY)
Admission: EM | Admit: 2015-04-04 | Discharge: 2015-04-04 | Disposition: A | Discharge status: Home / Self Care | Payer: No Typology Code available for payment source | Attending: Emergency Medicine | Admitting: Emergency Medicine

## 2015-04-04 ENCOUNTER — Emergency Department (HOSPITAL_COMMUNITY): Payer: No Typology Code available for payment source

## 2015-04-04 DIAGNOSIS — E669 Obesity, unspecified: Secondary | ICD-10-CM | POA: Insufficient documentation

## 2015-04-04 DIAGNOSIS — R110 Nausea: Secondary | ICD-10-CM | POA: Insufficient documentation

## 2015-04-04 DIAGNOSIS — R5081 Fever presenting with conditions classified elsewhere: Secondary | ICD-10-CM | POA: Insufficient documentation

## 2015-04-04 DIAGNOSIS — Z79899 Other long term (current) drug therapy: Secondary | ICD-10-CM | POA: Insufficient documentation

## 2015-04-04 DIAGNOSIS — J069 Acute upper respiratory infection, unspecified: Secondary | ICD-10-CM | POA: Insufficient documentation

## 2015-04-04 DIAGNOSIS — R05 Cough: Secondary | ICD-10-CM

## 2015-04-04 DIAGNOSIS — I10 Essential (primary) hypertension: Secondary | ICD-10-CM | POA: Insufficient documentation

## 2015-04-04 DIAGNOSIS — R509 Fever, unspecified: Secondary | ICD-10-CM

## 2015-04-04 DIAGNOSIS — H748X3 Other specified disorders of middle ear and mastoid, bilateral: Secondary | ICD-10-CM | POA: Insufficient documentation

## 2015-04-04 DIAGNOSIS — R0981 Nasal congestion: Secondary | ICD-10-CM

## 2015-04-04 DIAGNOSIS — G43909 Migraine, unspecified, not intractable, without status migrainosus: Secondary | ICD-10-CM | POA: Insufficient documentation

## 2015-04-04 DIAGNOSIS — J209 Acute bronchitis, unspecified: Secondary | ICD-10-CM | POA: Insufficient documentation

## 2015-04-04 DIAGNOSIS — J40 Bronchitis, not specified as acute or chronic: Secondary | ICD-10-CM

## 2015-04-04 DIAGNOSIS — Z7951 Long term (current) use of inhaled steroids: Secondary | ICD-10-CM | POA: Insufficient documentation

## 2015-04-04 DIAGNOSIS — Z880 Allergy status to penicillin: Secondary | ICD-10-CM | POA: Insufficient documentation

## 2015-04-04 DIAGNOSIS — R062 Wheezing: Secondary | ICD-10-CM

## 2015-04-04 DIAGNOSIS — R059 Cough, unspecified: Secondary | ICD-10-CM

## 2015-04-04 DIAGNOSIS — R001 Bradycardia, unspecified: Secondary | ICD-10-CM | POA: Insufficient documentation

## 2015-04-04 MED ORDER — IPRATROPIUM BROMIDE 0.02 % IN SOLN
0.5000 mg | Freq: Once | RESPIRATORY_TRACT | Status: AC
  Administered 2015-04-04: 0.5 mg via RESPIRATORY_TRACT
  Filled 2015-04-04: qty 2.5

## 2015-04-04 MED ORDER — ALBUTEROL SULFATE HFA 108 (90 BASE) MCG/ACT IN AERS: 2.0000 | INHALATION_SPRAY | RESPIRATORY_TRACT | Status: DC | PRN

## 2015-04-04 MED ORDER — GUAIFENESIN ER 600 MG PO TB12
600.0000 mg | ORAL_TABLET | Freq: Once | ORAL | Status: AC
  Administered 2015-04-04: 600 mg via ORAL
  Filled 2015-04-04 (×2): qty 1

## 2015-04-04 MED ORDER — PREDNISONE 20 MG PO TABS
60.0000 mg | ORAL_TABLET | Freq: Once | ORAL | Status: AC
  Administered 2015-04-04: 60 mg via ORAL
  Filled 2015-04-04: qty 3

## 2015-04-04 MED ORDER — ALBUTEROL SULFATE (2.5 MG/3ML) 0.083% IN NEBU
5.0000 mg | INHALATION_SOLUTION | Freq: Once | RESPIRATORY_TRACT | Status: AC
  Administered 2015-04-04: 5 mg via RESPIRATORY_TRACT
  Filled 2015-04-04: qty 6

## 2015-04-04 MED ORDER — ALBUTEROL (5 MG/ML) CONTINUOUS INHALATION SOLN
10.0000 mg/h | INHALATION_SOLUTION | RESPIRATORY_TRACT | Status: DC
Start: 2015-04-04 — End: 2015-04-04
  Administered 2015-04-04: 10 mg/h via RESPIRATORY_TRACT
  Filled 2015-04-04: qty 20

## 2015-04-04 MED ORDER — DOXYCYCLINE HYCLATE 100 MG PO CAPS: 100.0000 mg | ORAL_CAPSULE | Freq: Two times a day (BID) | ORAL | Status: DC

## 2015-04-04 MED ORDER — PREDNISONE 20 MG PO TABS: ORAL_TABLET | ORAL | Status: DC

## 2015-04-04 MED ORDER — ALBUTEROL SULFATE (2.5 MG/3ML) 0.083% IN NEBU
5.0000 mg | INHALATION_SOLUTION | Freq: Once | RESPIRATORY_TRACT | Status: AC
Start: 2015-04-04 — End: 2015-04-04
  Administered 2015-04-04: 5 mg via RESPIRATORY_TRACT
  Filled 2015-04-04: qty 6

## 2015-04-04 NOTE — Progress Notes (Signed)
Spoke to patient regarding primary care resources and the [facility] orange card. Orange card application provided and explained. Patient instructed to contact me once application is complete for an eligibility appointment. Resource guide and my contact information provided for any future questions or concerns. No other [facility] Specialist needs identified at this time.   Buddy [employer] [facility]lig[facility]ist Partnership for [facility] [phone]

## 2015-04-04 NOTE — ED Notes (Signed)
Pt reports recent dose of abx that she was given here for a sinus infection, pt reports she finished arythromycin a few days ago but her congestion and sinus pressure has gotten worse. Pt reports temp of 100.5 last night.

## 2015-04-04 NOTE — ED Notes (Signed)
demetria in respiratory aware of need for continuous neb.

## 2015-04-04 NOTE — Discharge Instructions (Signed)
Continue to stay well-hydrated. Continue to alternate between Tylenol and Ibuprofen for pain or fever. Use Mucinex for cough suppression/expectoration of mucus. Use over the counter cough/cold medications as needed for symptoms. Use netipot and flonase to help with nasal congestion. May consider over-the-counter Benadryl or other antihistamine to decrease secretions and for watery itchy eyes. Use inhaler as directed, as needed for cough/chest congestion. Take antibiotic as directed until completed. Take prednisone as directed to help improve your symptoms, take in the morning with breakfast to diminish the side effects. Followup with your primary care doctor in 5-7 days for recheck of ongoing symptoms. Return to emergency department for emergent changing or worsening of symptoms.   Cough, Adult A cough helps to clear your throat and lungs. A cough may last only 2-3 weeks (acute), or it may last longer than 8 weeks (chronic). Many different things can cause a cough. A cough may be a sign of an illness or another medical condition. HOME CARE 1. Pay attention to any changes in your cough. 2. Take medicines only as told by your doctor. 1. If you were prescribed an antibiotic medicine, take it as told by your doctor. Do not stop taking it even if you start to feel better. 2. Talk with your doctor before you try using a cough medicine. 3. Drink enough fluid to keep your pee (urine) clear or pale yellow. 4. If the air is dry, use a cold steam vaporizer or humidifier in your home. 5. Stay away from things that make you cough at work or at home. 6. If your cough is worse at night, try using extra pillows to raise your head up higher while you sleep. 7. Do not smoke, and try not to be around smoke. If you need help quitting, ask your doctor. 8. Do not have caffeine. 9. Do not drink alcohol. 10. Rest as needed. GET HELP IF:  You have new problems (symptoms).  You cough up yellow fluid (pus).  Your cough  does not get better after 2-3 weeks, or your cough gets worse.  Medicine does not help your cough and you are not sleeping well.  You have pain that gets worse or pain that is not helped with medicine.  You have a fever.  You are losing weight and you do not know why.  You have night sweats. GET HELP RIGHT AWAY IF:  You cough up blood.  You have trouble breathing.  Your heartbeat is very fast.   This information is not intended to replace advice given to you by your health care provider. Make sure you discuss any questions you have with your health care provider.   Document Released: 10/09/2010 Document Revised: 10/17/2014 Document Reviewed: 04/04/2014 Elsevier Interactive Patient Education 2016 [employer]  Upper Respiratory Infection, Adult Most upper respiratory infections (URIs) are a viral infection of the air passages leading to the lungs. A URI affects the nose, throat, and upper air passages. The most common type of URI is nasopharyngitis and is typically referred to as "the common cold." URIs run their course and usually go away on their own. Most of the time, a URI does not require medical attention, but sometimes a bacterial infection in the upper airways can follow a viral infection. This is called a secondary infection. Sinus and middle ear infections are common types of secondary upper respiratory infections. Bacterial pneumonia can also complicate a URI. A URI can worsen asthma and chronic obstructive pulmonary disease (COPD). Sometimes, these complications can require  emergency medical care and may be life threatening.  CAUSES Almost all URIs are caused by viruses. A virus is a type of germ and can spread from one person to another.  RISKS FACTORS You may be at risk for a URI if:  11. You smoke.  12. You have chronic heart or lung disease. 13. You have a weakened defense (immune) system.  14. You are very young or very old.  15. You have nasal allergies or  asthma. 16. You work in crowded or poorly ventilated areas. 17. You work in health care facilities or schools. SIGNS AND SYMPTOMS  Symptoms typically develop 2-3 days after you come in contact with a cold virus. Most viral URIs last 7-10 days. However, viral URIs from the influenza virus (flu virus) can last 14-18 days and are typically more severe. Symptoms may include:   Runny or stuffy (congested) nose.   Sneezing.   Cough.   Sore throat.   Headache.   Fatigue.   Fever.   Loss of appetite.   Pain in your forehead, behind your eyes, and over your cheekbones (sinus pain).  Muscle aches.  DIAGNOSIS  Your health care provider may diagnose a URI by:  Physical exam.  Tests to check that your symptoms are not due to another condition such as:  Strep throat.  Sinusitis.  Pneumonia.  Asthma. TREATMENT  A URI goes away on its own with time. It cannot be cured with medicines, but medicines may be prescribed or recommended to relieve symptoms. Medicines may help:  Reduce your fever.  Reduce your cough.  Relieve nasal congestion. HOME CARE INSTRUCTIONS   Take medicines only as directed by your health care provider.   Gargle warm saltwater or take cough drops to comfort your throat as directed by your health care provider.  Use a warm mist humidifier or inhale steam from a shower to increase air moisture. This may make it easier to breathe.  Drink enough fluid to keep your urine clear or pale yellow.   Eat soups and other clear broths and maintain good nutrition.   Rest as needed.   Return to work when your temperature has returned to normal or as your health care provider advises. You may need to stay home longer to avoid infecting others. You can also use a face mask and careful hand washing to prevent spread of the virus.  Increase the usage of your inhaler if you have asthma.   Do not use any tobacco products, including cigarettes, chewing  tobacco, or electronic cigarettes. If you need help quitting, ask your health care provider. PREVENTION  The best way to protect yourself from getting a cold is to practice good hygiene.   Avoid oral or hand contact with people with cold symptoms.   Wash your hands often if contact occurs.  There is no clear evidence that vitamin C, vitamin E, echinacea, or exercise reduces the chance of developing a cold. However, it is always recommended to get plenty of rest, exercise, and practice good nutrition.  SEEK MEDICAL CARE IF:   You are getting worse rather than better.   Your symptoms are not controlled by medicine.   You have chills.  You have worsening shortness of breath.  You have brown or red mucus.  You have yellow or brown nasal discharge.  You have pain in your face, especially when you bend forward.  You have a fever.  You have swollen neck glands.  You have pain while swallowing.  You have white areas in the back of your throat. SEEK IMMEDIATE MEDICAL CARE IF:   You have severe or persistent:  Headache.  Ear pain.  Sinus pain.  Chest pain.  You have chronic lung disease and any of the following:  Wheezing.  Prolonged cough.  Coughing up blood.  A change in your usual mucus.  You have a stiff neck.  You have changes in your:  Vision.  Hearing.  Thinking.  Mood. MAKE SURE YOU:   Understand these instructions.  Will watch your condition.  Will get help right away if you are not doing well or get worse.   This information is not intended to replace advice given to you by your health care provider. Make sure you discuss any questions you have with your health care provider.   Document Released: 07/22/2000 Document Revised: 06/12/2014 Document Reviewed: 05/03/2013 Elsevier Interactive Patient Education 2016 [employer].  How to Use an Inhaler Using your inhaler correctly is very important. Good technique will make sure that the  medicine reaches your lungs.  HOW TO USE AN INHALER: 18. Take the cap off the inhaler. 19. If this is the first time using your inhaler, you need to prime it. Shake the inhaler for 5 seconds. Release four puffs into the air, away from your face. Ask your doctor for help if you have questions. 20. Shake the inhaler for 5 seconds. 21. Turn the inhaler so the bottle is above the mouthpiece. 22. Put your pointer finger on top of the bottle. Your thumb holds the bottom of the inhaler. 23. Open your mouth. 24. Either hold the inhaler away from your mouth (the width of 2 fingers) or place your lips tightly around the mouthpiece. Ask your doctor which way to use your inhaler. 25. Breathe out as much air as possible. 26. Breathe in and push down on the bottle 1 time to release the medicine. You will feel the medicine go in your mouth and throat. 27. Continue to take a deep breath in very slowly. Try to fill your lungs. 28. After you have breathed in completely, hold your breath for 10 seconds. This will help the medicine to settle in your lungs. If you cannot hold your breath for 10 seconds, hold it for as long as you can before you breathe out. 29. Breathe out slowly, through pursed lips. Whistling is an example of pursed lips. 30. If your doctor has told you to take more than 1 puff, wait at least 15-30 seconds between puffs. This will help you get the best results from your medicine. Do not use the inhaler more than your doctor tells you to. 31. Put the cap back on the inhaler. 32. Follow the directions from your doctor or from the inhaler package about cleaning the inhaler. If you use more than one inhaler, ask your doctor which inhalers to use and what order to use them in. Ask your doctor to help you figure out when you will need to refill your inhaler.  If you use a steroid inhaler, always rinse your mouth with water after your last puff, gargle and spit out the water. Do not swallow the water. GET  HELP IF:  The inhaler medicine only partially helps to stop wheezing or shortness of breath.  You are having trouble using your inhaler.  You have some increase in thick spit (phlegm). GET HELP RIGHT AWAY IF:  The inhaler medicine does not help your wheezing or shortness of breath or you  have tightness in your chest.  You have dizziness, headaches, or fast heart rate.  You have chills, fever, or night sweats.  You have a large increase of thick spit, or your thick spit is bloody. MAKE SURE YOU:   Understand these instructions.  Will watch your condition.  Will get help right away if you are not doing well or get worse.   This information is not intended to replace advice given to you by your health care provider. Make sure you discuss any questions you have with your health care provider.   Document Released: 11/05/2007 Document Revised: 11/16/2012 Document Reviewed: 08/25/2012 Elsevier Interactive Patient Education [employer]

## 2015-04-04 NOTE — ED Notes (Signed)
Pt. reports persistent dry cough , nasal congestion , headache and chest congestion for several days .

## 2015-04-04 NOTE — ED Provider Notes (Signed)
CSN: [id number]     Arrival date & time 04/04/15  0356 History   First MD Initiated Contact with Patient 04/04/15 [phone]     Chief Complaint  Patient presents with  . Cough     (Consider location/radiation/quality/duration/timing/severity/associated sxs/prior Treatment) HPI Comments: Pamela [name] is a 45 y.o. female with a PMHx of chronic CP, DJD, migraines, obesity, and HTN, who presents to the ED with complaints of URI symptoms. Patient states that several weeks ago she had sinus congestion and sinus headache for which she was treated for sinusitis with a Z-Pak, she states this improved, but 3 days ago she returned to work and was around several sick contacts, and she has developed a gradual onset cough and chest congestion of the last 3 days. The cough is dry with no sputum production. Associated symptoms include fever with T max 100.6 overnight, nausea, and body aches. No known aggravating factors. No improvement with over-the-counter Mucinex and Sudafed. She feels as though the sinus congestion improved and has not worsened since onset of these new symptoms, but she feels this is a new illness on top of the old one. She denies any wheezing, rhinorrhea, ear pain or drainage, eye itching or redness, sore throat, chest pain, shortness breath, abdominal pain, vomiting, diarrhea, constipation, dysuria, hematuria, vaginal bleeding or discharge, numbness, tingling, weakness, or any others symptoms. She is not a smoker. No prior history of asthma/COPD.  Patient is a 45 y.o. female presenting with cough. The history is provided by the patient and medical records. No language interpreter was used.  Cough Cough characteristics:  Dry Severity:  Moderate Onset quality:  Gradual Duration:  3 days Timing:  Constant Progression:  Worsening Chronicity:  New Smoker: no   Context: sick contacts and upper respiratory infection   Relieved by:  Nothing Worsened by:  Nothing tried Ineffective treatments:   Decongestant and cough suppressants Associated symptoms: fever, myalgias (body aches) and sinus congestion   Associated symptoms: no chest pain, no ear pain, no eye discharge, no rhinorrhea, no shortness of breath, no sore throat and no wheezing     Past Medical History  Diagnosis Date  . Chest pain   . Degenerative joint disease   . Migraine   . Obesity   . Herpes simplex   . Hypertension   . Osteoarthritis of knee   . Migraine without aura, with intractable migraine, so stated, without mention of status migrainosus 07/26/2013  . Normal cardiac stress test 2012  . Hx of echocardiogram 2012    normal   Past Surgical History  Procedure Laterality Date  . Cesarean section    . Knee surgery  2012    right  . Exploration post operative open heart    . Cardiovascular stress test      negative  . Asd repair  [id number]  . Carpal tunnel release     Family History  Problem Relation Age of Onset  . Coronary artery disease Mother 20  . Stroke Mother   . Migraines Mother   . Other       There is also history of diabetes, stroke, and cancer in the family  . Migraines Sister    Social History  Substance Use Topics  . Smoking status: Never Smoker   . Smokeless tobacco: Never Used  . Alcohol Use: Yes     Comment: occasional   OB History    No data available     Review of Systems  Constitutional: Positive for  fever.  HENT: Positive for sinus pressure (improving). Negative for ear discharge, ear pain, rhinorrhea and sore throat.   Eyes: Negative for discharge and redness.  Respiratory: Positive for cough. Negative for shortness of breath and wheezing.   Cardiovascular: Negative for chest pain.  Gastrointestinal: Positive for nausea. Negative for vomiting, abdominal pain, diarrhea and constipation.  Genitourinary: Negative for dysuria, hematuria, vaginal bleeding and vaginal discharge.  Musculoskeletal: Positive for myalgias (body aches). Negative for arthralgias.  Skin: Negative  for color change.  Allergic/Immunologic: Negative for immunocompromised state.  Neurological: Negative for weakness and numbness.  Psychiatric/Behavioral: Negative for confusion.   10 Systems reviewed and are negative for acute change except as noted in the HPI.    Allergies  Amoxicillin; Bee venom; Imitrex; and Vicodin  Home Medications   Prior to Admission medications   Medication Sig Start Date End Date Taking? Authorizing Provider  Ascorbic Acid (VITAMIN C PO) Take 1,000 mg by mouth daily.     Historical Provider, MD  azithromycin (ZITHROMAX) 250 MG tablet Take 1 tablet (250 mg total) by mouth daily. Take first 2 tablets together, then 1 every day until finished. 03/22/15   Gwyneth Sprout, MD  cetirizine (ZYRTEC) 10 MG tablet Take 1 tablet (10 mg total) by mouth daily. One tab daily for allergies Patient taking differently: Take 10 mg by mouth at bedtime. One tab daily for allergies 04/23/13   Linna Hoff, MD  Cholecalciferol (VITAMIN D3) 5000 UNITS TABS Take 5,000 Units by mouth daily.     Historical Provider, MD  Cyanocobalamin (VITAMIN B 12 PO) Take 1 tablet by mouth daily.    Historical Provider, MD  fluticasone (FLONASE) 50 MCG/ACT nasal spray Place 2 sprays into both nostrils at bedtime. 11/09/14   Ozella Rocks, MD  ibuprofen (ADVIL,MOTRIN) 200 MG tablet Take 800 mg by mouth every 6 (six) hours as needed for moderate pain.    Historical Provider, MD  ipratropium (ATROVENT) 0.06 % nasal spray Place 2 sprays into both nostrils 4 (four) times daily. 11/09/14   Ozella Rocks, MD  Magnesium 500 MG TABS Take 2,500 mg by mouth daily.    Historical Provider, MD  meclizine (ANTIVERT) 25 MG tablet Take 1 tablet (25 mg total) by mouth 3 (three) times daily as needed for dizziness. 03/22/15   Gwyneth Sprout, MD  methocarbamol (ROBAXIN) 500 MG tablet Take 2 tablets (1,000 mg total) by mouth 4 (four) times daily as needed for muscle spasms (muscle spasm/pain). Patient not taking:  Reported on 03/22/2015 11/13/13   Samuel Jester, DO  Omega-3 Fatty Acids (FISH OIL) 1200 MG CPDR Take 1,200 mg by mouth at bedtime.     Historical Provider, MD  oxyCODONE-acetaminophen (PERCOCET/ROXICET) 5-325 MG per tablet 1 or 2 tabs PO q6h prn pain Patient not taking: Reported on 03/22/2015 11/13/13   Samuel Jester, DO  Phenylephrine-DM-GG-APAP (MUCINEX FAST-MAX COLD FLU PO) Take 1 tablet by mouth daily as needed (for cold and flu symptoms).    Historical Provider, MD  pseudoephedrine (SUDAFED) 30 MG tablet Take 30 mg by mouth every 4 (four) hours as needed for congestion.    Historical Provider, MD  rizatriptan (MAXALT-MLT) 10 MG disintegrating tablet Take 1 tablet (10 mg total) by mouth 3 (three) times daily as needed for migraine. May repeat in 2 hours if needed 07/26/13   York Spaniel, MD  vitamin A 8000 UNIT capsule Take 8,000 Units by mouth at bedtime.     Historical Provider, MD  vitamin E 400  UNIT capsule Take 400 Units by mouth at bedtime.     Historical Provider, MD   BP 127/73 mmHg  Pulse 52  Temp(Src) 98.6 F (37 C) (Oral)  Resp 18  Ht 5\' 3"  (1.6 m)  Wt 109.77 kg  BMI 42.88 kg/m2  SpO2 98% Physical Exam  Constitutional: She is oriented to person, place, and time. Vital signs are normal. She appears well-developed and well-nourished.  Non-toxic appearance. No distress.  Afebrile, nontoxic, NAD  HENT:  Head: Normocephalic and atraumatic.  Right Ear: Hearing, external ear and ear canal normal. A middle ear effusion (serous) is present.  Left Ear: Hearing, external ear and ear canal normal. A middle ear effusion (serous) is present.  Nose: Mucosal edema and rhinorrhea present.  Mouth/Throat: Uvula is midline, oropharynx is clear and moist and mucous membranes are normal. No trismus in the jaw. No uvula swelling.  Ears with serous effusion bilaterally but TMs clear without erythema or bulging. Canals clear. Nose with mild mucosal edema and rhinorrhea. Oropharynx clear and  moist, without uvular swelling or deviation, no trismus or drooling, no tonsillar swelling or erythema, no exudates.    Eyes: Conjunctivae and EOM are normal. Right eye exhibits no discharge. Left eye exhibits no discharge.  Neck: Normal range of motion. Neck supple.  Cardiovascular: Regular rhythm, normal heart sounds and intact distal pulses.  Bradycardia present.  Exam reveals no gallop and no friction rub.   No murmur heard. Chronic baseline bradycardia  Pulmonary/Chest: Effort normal. No respiratory distress. She has no decreased breath sounds. She has wheezes. She has rhonchi in the right lower field. She has no rales.  Diffuse expiratory wheezing throughout, with rhonchi in RLL, no rales, no hypoxia or increased WOB, speaking in full sentences, SpO2 96% on RA  Abdominal: Soft. Normal appearance and bowel sounds are normal. She exhibits no distension. There is no tenderness. There is no rigidity, no rebound, no guarding, no CVA tenderness, no tenderness at McBurney's point and negative Murphy's sign.  Musculoskeletal: Normal range of motion.  Neurological: She is alert and oriented to person, place, and time. She has normal strength. No sensory deficit.  Skin: Skin is warm, dry and intact. No rash noted.  Psychiatric: She has a normal mood and affect.  Nursing note and vitals reviewed.   ED Course  Procedures (including critical care time)  CRITICAL CARE-multiple nebulizer treatments Performed by: Ramond Marrow   Total critical care time: 45 minutes  Critical care time was exclusive of separately billable procedures and treating other patients.  Critical care was necessary to treat or prevent imminent or life-threatening deterioration.  Critical care was time spent personally by me on the following activities: development of treatment plan with patient and/or surrogate as well as nursing, discussions with consultants, evaluation of patient's response to treatment,  examination of patient, obtaining history from patient or surrogate, ordering and performing treatments and interventions, ordering and review of laboratory studies, ordering and review of radiographic studies, pulse oximetry and re-evaluation of patient's condition.  Labs Review Labs Reviewed - No data to display  Imaging Review Dg Chest 2 View  04/04/2015  CLINICAL DATA:  Acute onset of cough and fever.  Initial encounter. EXAM: CHEST  2 VIEW COMPARISON:  Chest radiograph performed 11/13/2013 FINDINGS: The lungs are well-aerated. Minimal bibasilar atelectasis is noted. There is no evidence of pleural effusion or pneumothorax. The heart is normal in size; the mediastinal contour is within normal limits. No acute osseous abnormalities are seen. IMPRESSION:  Minimal bibasilar atelectasis noted.  Lungs otherwise clear. Electronically Signed   By: Roanna Raider M.D.   On: 04/04/2015 04:51   I have personally reviewed and evaluated these images and lab results as part of my medical decision-making.   EKG Interpretation None      MDM   Final diagnoses:  Cough  Wheezing  Bronchitis  Sinus congestion  Other specified fever  URI (upper respiratory infection)    46 y.o. female here with cough/congestion x3 days, following a recent sinus infection for which she was treated with zpak and initially improved. Went back to work, +sick contacts, and feels like she is getting worse again. On exam, wheezing in all lung fields with rhonchi in the RLL. Initially had low-grade fever 100.5, which improved overnight without tx. CXR negative, but given lung findings and the fact that it seems like maybe she got another infection on top of what she had already, may err on side of caution and give abx at discharge (likely doxycycline which would cover for different bacterial etiologies compared to zpak). Will give prednisone and breathing tx now and monitor for improvement. Will reassess shortly.    9:08  AM Lung sounds with slight improvement in wheezing but sound more rhonchorous c/w opening airways up and more movement throughout lungs. Will repeat nebulizer as this is helping, I feel she will improve more with another neb tx. Will reassess shortly.   10:26 AM After second neb, some improvement, but still overall rhonchorous, will give guaifenesin tablet here (since it's been 8hrs since her last dose) and do continuous neb tx. Pt doesn't want to stay in the hospital, and I feel that overall we have gotten some improvement since she's having improved SpO2 readings and improving lung sounds overall, just still has some room for improvement. Will do neb tx and then likely d/c home with albuterol inhaler, prednisone, and doxycycline. Will reassess after CAT tx.  12:24 PM Lung sounds greatly improved after CAT neb. Will d/c home with previously discussed plan. Pt feels better and agrees with plan. F/up with PCP in 1wk for recheck. I explained the diagnosis and have given explicit precautions to return to the ER including for any other new or worsening symptoms. The patient understands and accepts the medical plan as it's been dictated and I have answered their questions. Discharge instructions concerning home care and prescriptions have been given. The patient is STABLE and is discharged to home in good condition.  BP 120/55 mmHg  Pulse 86  Temp(Src) 98.6 F (37 C) (Oral)  Resp 18  Ht 5\' 3"  (1.6 m)  Wt 109.77 kg  BMI 42.88 kg/m2  SpO2 95%  Meds ordered this encounter  Medications  . predniSONE (DELTASONE) tablet 60 mg    Sig:   . albuterol (PROVENTIL) (2.5 MG/3ML) 0.083% nebulizer solution 5 mg    Sig:   . ipratropium (ATROVENT) nebulizer solution 0.5 mg    Sig:   . albuterol (PROVENTIL) (2.5 MG/3ML) 0.083% nebulizer solution 5 mg    Sig:   . ipratropium (ATROVENT) nebulizer solution 0.5 mg    Sig:   . albuterol (PROVENTIL HFA;VENTOLIN HFA) 108 (90 Base) MCG/ACT inhaler    Sig: Inhale 2  puffs into the lungs every 2 (two) hours as needed for wheezing or shortness of breath (cough).    Dispense:  1 Inhaler    Refill:  0    Order Specific Question:  Supervising Provider    Answer:  MILLER, BRIAN [[id number]]  .  predniSONE (DELTASONE) 20 MG tablet    Sig: 3 tabs po daily x 3 days starting 04/05/15    Dispense:  9 tablet    Refill:  0    Order Specific Question:  Supervising Provider    Answer:  Eber Hong [[id number]]  . doxycycline (VIBRAMYCIN) 100 MG capsule    Sig: Take 1 capsule (100 mg total) by mouth 2 (two) times daily. One po bid x 7 days    Dispense:  14 capsule    Refill:  0    Order Specific Question:  Supervising Provider    Answer:  MILLER, BRIAN [[id number]]  . albuterol (PROVENTIL,VENTOLIN) solution continuous neb    Sig:   . guaiFENesin (MUCINEX) 12 hr tablet 600 mg    Sig:      Jennifermarie Franzen Camprubi-Soms, PA-C 04/04/15 1225  Donnetta Hutching, MD 04/04/15 1236

## 2015-05-06 ENCOUNTER — Ambulatory Visit (INDEPENDENT_AMBULATORY_CARE_PROVIDER_SITE_OTHER): Payer: No Typology Code available for payment source | Admitting: Family Medicine

## 2015-05-06 ENCOUNTER — Encounter: Payer: Self-pay | Admitting: Family Medicine

## 2015-05-06 VITALS — BP 112/56 | HR 60 | Temp 98.2°F | Resp 16 | Ht 63.0 in | Wt 250.0 lb

## 2015-05-06 DIAGNOSIS — J3489 Other specified disorders of nose and nasal sinuses: Secondary | ICD-10-CM

## 2015-05-06 DIAGNOSIS — J0111 Acute recurrent frontal sinusitis: Secondary | ICD-10-CM

## 2015-05-06 DIAGNOSIS — R809 Proteinuria, unspecified: Secondary | ICD-10-CM

## 2015-05-06 DIAGNOSIS — R002 Palpitations: Secondary | ICD-10-CM

## 2015-05-06 DIAGNOSIS — R9431 Abnormal electrocardiogram [ECG] [EKG]: Secondary | ICD-10-CM

## 2015-05-06 LAB — CBC WITH DIFFERENTIAL/PLATELET
BASOS PCT: 0 % (ref 0–1)
Basophils Absolute: 0 10*3/uL (ref 0.0–0.1)
EOS PCT: 1 % (ref 0–5)
Eosinophils Absolute: 0 10*3/uL (ref 0.0–0.7)
HEMATOCRIT: 35.8 % — AB (ref 36.0–46.0)
HEMOGLOBIN: 12.1 g/dL (ref 12.0–15.0)
LYMPHS PCT: 42 % (ref 12–46)
Lymphs Abs: 1.9 10*3/uL (ref 0.7–4.0)
MCH: 27.2 pg (ref 26.0–34.0)
MCHC: 33.8 g/dL (ref 30.0–36.0)
MCV: 80.4 fL (ref 78.0–100.0)
MONO ABS: 0.4 10*3/uL (ref 0.1–1.0)
MONOS PCT: 8 % (ref 3–12)
MPV: 10.4 fL (ref 8.6–12.4)
NEUTROS ABS: 2.3 10*3/uL (ref 1.7–7.7)
Neutrophils Relative %: 49 % (ref 43–77)
Platelets: 209 10*3/uL (ref 150–400)
RBC: 4.45 MIL/uL (ref 3.87–5.11)
RDW: 14.5 % (ref 11.5–15.5)
WBC: 4.6 10*3/uL (ref 4.0–10.5)

## 2015-05-06 LAB — POCT URINALYSIS DIP (DEVICE)
Bilirubin Urine: NEGATIVE
Glucose, UA: NEGATIVE mg/dL
HGB URINE DIPSTICK: NEGATIVE
Ketones, ur: NEGATIVE mg/dL
Leukocytes, UA: NEGATIVE
NITRITE: NEGATIVE
PH: 5 (ref 5.0–8.0)
PROTEIN: 30 mg/dL — AB
Specific Gravity, Urine: 1.025 (ref 1.005–1.030)
Urobilinogen, UA: 0.2 mg/dL (ref 0.0–1.0)

## 2015-05-06 MED ORDER — FLUTICASONE PROPIONATE 50 MCG/ACT NA SUSP: 2.0000 | Freq: Every day | NASAL | Status: DC

## 2015-05-06 NOTE — Patient Instructions (Addendum)
Abnormal EKG, will send a referral to cardiology  Diet for Metabolic Syndrome Metabolic syndrome is a disorder that includes at least three of these conditions:  Abdominal obesity.  Too much sugar in your blood.  High blood pressure.  Higher than normal amount of fat (lipids) in your blood.  Lower than normal level of "good" cholesterol (HDL). Following a healthy diet can help to keep metabolic syndrome under control. It can also help to prevent the development of conditions that are associated with metabolic syndrome, such as diabetes, heart disease, and stroke. Along with exercise, a healthy diet:  Helps to improve the way that the body uses insulin.  Promotes weight loss. A common goal for people with this condition is to lose at least 7 to 10 percent of their starting weight. WHAT DO I NEED TO KNOW ABOUT THIS DIET?  Use the glycemic index (GI) to plan your meals. The index tells you how quickly a food will raise your blood sugar. Choose foods that have low GI values. These foods take a longer time to raise blood sugar.  Keep track of how many calories you take in. Eating the right amount of calories will help your achieve a healthy weight.  You may want to follow a Mediterranean diet. This diet includes lots of vegetables, lean meats or fish, whole grains, fruits, and healthy oils and fats. WHAT FOODS CAN I EAT? Grains Stone-ground whole wheat. Pumpernickel bread. Whole-grain bread, crackers, tortillas, cereal, and pasta. Unsweetened oatmeal.Bulgur.Barley.Quinoa.Brown rice or wild rice. Vegetables Lettuce. Spinach. Peas. Beets. Cauliflower. Cabbage. Broccoli. Carrots. Tomatoes. Squash. Eggplant. Herbs. Peppers. Onions. Cucumbers. Brussels sprouts. Sweet potatoes. Yams. Beans. Lentils. Fruits Berries. Apples. Oranges. Grapes. Mango. Pomegranate. Kiwi. Cherries. Meats and Other Protein Sources Seafood and shellfish. Lean meats.Poultry. Tofu. Dairy Low-fat or fat-free dairy  products, such as milk, yogurt, and cheese. Beverages Water. Low-fat milk. Milk alternatives, like soy milk or almond milk. Real fruit juice. Condiments Low-sugar or sugar-free ketchup, barbecue sauce, and mayonnaise. Mustard. Relish. Fats and Oils Avocado. Canola or olive oil. Nuts and nut butters.Seeds. The items listed above may not be a complete list of recommended foods or beverages. Contact your dietitian for more options.  WHAT FOODS ARE NOT RECOMMENDED? Red meat. Palm oil and coconut oil. Processed foods. Fried foods. Alcohol. Sweetened drinks, such as iced tea and soda. Sweets. Salty foods. The items listed above may not be a complete list of foods and beverages to avoid. Contact your dietitian for more information.   This information is not intended to replace advice given to you by your health care provider. Make sure you discuss any questions you have with your health care provider.   Document Released: 06/12/2014 Document Reviewed: 06/12/2014 Elsevier Interactive Patient Education [employer]2016 [employer] Inc.

## 2015-05-06 NOTE — Progress Notes (Signed)
Subjective:    Patient ID: Pamela [name], female    DOB: Oct 31, 1970, 45 y.o.   MRN: [id number]  HPI  Pamela [name], a 45 year old female presents to establish care. She reports that she was a patient of Dr. Dorothyann Peng but has been lost to follow up due insurance constraints. She maintains that she has been using urgent care and the emergency department for all primary needs. She was recently evaluated in the emergency department on 04/04/2015 for cough, wheezing, and sinus congestion. She was treated with a course of antibiotics and steroids.  She is currently complaining of sinus pressure. The patient reports chronic sinus infections over the past several months.  Her symptoms include maxillary sinus pressure and nasal congestion. Prior antibiotic therapy has included azithromycin. Other medications have included daily antihistamines.  She has not had allergy testing.    Patient complains of periodic heart  palpitations.  The symptoms are of mild severity, occuring intermittently.  Cardiac risk factors include: obesity (BMI >= 30 kg/m2) and sedentary lifestyle. Patient has not identified any aggravating factors. Alleviating factors include increased rest. She denies headache, dizziness, shortness of breath, chest tightness, fatigue, or swelling to lower extremities.  Past Medical History  Diagnosis Date  . Chest pain   . Degenerative joint disease   . Migraine   . Obesity   . Herpes simplex   . Hypertension   . Osteoarthritis of knee   . Migraine without aura, with intractable migraine, so stated, without mention of status migrainosus 07/26/2013  . Normal cardiac stress test 2012  . Hx of echocardiogram 2012    normal   Past Surgical History  Procedure Laterality Date  . Cesarean section    . Knee surgery  2012    right  . Exploration post operative open heart    . Cardiovascular stress test      negative  . Asd repair  [id number]  . Carpal tunnel release     Social History    Social History Narrative   Allergies  Allergen Reactions  . Amoxicillin Nausea Only  . Bee Venom Swelling  . Imitrex [Sumatriptan] Other (See Comments)    Gives chest pains  . Vicodin [Hydrocodone-Acetaminophen] Other (See Comments)    Migraines   Review of Systems  Constitutional: Positive for unexpected weight change (Weight gain). Negative for fever and fatigue.  HENT: Positive for postnasal drip and rhinorrhea.   Eyes: Negative.   Respiratory: Negative for apnea, cough, chest tightness, shortness of breath and wheezing.   Cardiovascular: Positive for palpitations. Negative for chest pain and leg swelling.  Endocrine: Negative.  Negative for polydipsia, polyphagia and polyuria.  Genitourinary: Negative.   Musculoskeletal: Negative.   Skin: Negative.   Allergic/Immunologic: Negative for immunocompromised state.  Neurological: Negative.  Negative for dizziness, seizures, light-headedness and numbness.  Hematological: Negative.   Psychiatric/Behavioral: Negative.        Objective:   Physical Exam  Constitutional: She is oriented to person, place, and time. She appears well-developed and well-nourished.  Morbid obesity  HENT:  Head: Normocephalic and atraumatic.  Right Ear: External ear normal.  Left Ear: External ear normal.  Nose: Mucosal edema present. Right sinus exhibits maxillary sinus tenderness. Left sinus exhibits no maxillary sinus tenderness.  Mouth/Throat: Posterior oropharyngeal erythema present.  Cardiovascular: Regular rhythm, S1 normal, normal heart sounds and intact distal pulses.  Bradycardia present.   Pulses:      Carotid pulses are 2+ on the right side, and  2+ on the left side.      Radial pulses are 2+ on the right side, and 2+ on the left side.       Femoral pulses are 2+ on the right side, and 2+ on the left side.      Popliteal pulses are 2+ on the right side, and 2+ on the left side.       Dorsalis pedis pulses are 2+ on the right side, and 2+  on the left side.       Posterior tibial pulses are 2+ on the right side, and 2+ on the left side.  Pulmonary/Chest: Effort normal and breath sounds normal.  Abdominal: Soft. Bowel sounds are normal.  Increased abdominal girth  Musculoskeletal: Normal range of motion.  Neurological: She is alert and oriented to person, place, and time. She has normal reflexes.  Skin: Skin is warm.  Psychiatric: She has a normal mood and affect. Her behavior is normal. Judgment and thought content normal.      BP 112/56 mmHg  Pulse 60  Temp(Src) 98.2 F (36.8 C) (Oral)  Resp 16  Ht 5\' 3"  (1.6 m)  Wt 250 lb (113.399 kg)  BMI 44.30 kg/m2  Assessment & Plan:  1. Sinus pressure Patient has recently completed a course of steroids and antibiotics. Will start a trial of Fluticasone for mucosal edema.  - fluticasone (FLONASE) 50 MCG/ACT nasal spray; Place 2 sprays into both nostrils at bedtime.  Dispense: 16 g; Refill: 0  2. Morbid obesity, unspecified obesity type (HCC) Recommend a lowfat, low carbohydrate diet divided over 5-6 small meals, increase water intake to 6-8 glasses, and 150 minutes per week of cardiovascular exercise.   - Hemoglobin A1c - TSH - CBC with Differential - Lipid Panel - COMPLETE METABOLIC PANEL WITH GFR - POCT urinalysis dip (device)  3. Heart palpitations Patient is currently not having heart palpitations at present. Reviewed EKG, abnormal. Will send a referral to cardiology for further evaluation.  - EKG 12-Lead  4. Nonspecific abnormal electrocardiogram (ECG) (EKG) - Ambulatory referral to Cardiology  5. Proteinuria - Microalbumin/Creatinine Ratio, Urine   RTC: Will schedule follow up after reviewing laboratory values.   The patient was given clear instructions to go to ER or return to medical center if symptoms do not improve, worsen or new problems develop. The patient verbalized understanding. Will notify patient with laboratory results.  Massie MaroonHollis,Apollos Tenbrink M, FNP

## 2015-05-07 ENCOUNTER — Other Ambulatory Visit: Payer: Self-pay | Admitting: Family Medicine

## 2015-05-07 DIAGNOSIS — E781 Pure hyperglyceridemia: Secondary | ICD-10-CM | POA: Insufficient documentation

## 2015-05-07 LAB — COMPLETE METABOLIC PANEL WITH GFR
ALT: 20 U/L (ref 6–29)
AST: 23 U/L (ref 10–30)
Albumin: 4.4 g/dL (ref 3.6–5.1)
Alkaline Phosphatase: 72 U/L (ref 33–115)
BUN: 17 mg/dL (ref 7–25)
CHLORIDE: 106 mmol/L (ref 98–110)
CO2: 23 mmol/L (ref 20–31)
CREATININE: 0.76 mg/dL (ref 0.50–1.10)
Calcium: 9.4 mg/dL (ref 8.6–10.2)
GFR, Est African American: >89 mL/min (ref >=60)
GFR, Est Non African American: >89 mL/min (ref >=60)
GLUCOSE: 114 mg/dL — AB (ref 65–99)
Potassium: 3.8 mmol/L (ref 3.5–5.3)
SODIUM: 141 mmol/L (ref 135–146)
TOTAL PROTEIN: 7 g/dL (ref 6.1–8.1)
Total Bilirubin: 0.3 mg/dL (ref 0.2–1.2)

## 2015-05-07 LAB — MICROALBUMIN / CREATININE URINE RATIO
Creatinine, Urine: 133 mg/dL (ref 20–320)
MICROALB UR: 20.7 mg/dL
Microalb Creat Ratio: 156 mcg/mg creat — ABNORMAL HIGH (ref <30)

## 2015-05-07 LAB — LIPID PANEL
Cholesterol: 161 mg/dL (ref 125–200)
HDL: 30 mg/dL — ABNORMAL LOW (ref >=46)
Total CHOL/HDL Ratio: 5.4 Ratio — ABNORMAL HIGH (ref <=5.0)
Triglycerides: 582 mg/dL — ABNORMAL HIGH (ref <150)

## 2015-05-07 LAB — HEMOGLOBIN A1C
HEMOGLOBIN A1C: 5.4 % (ref <5.7)
Mean Plasma Glucose: 108 mg/dL

## 2015-05-07 LAB — TSH: TSH: 0.94 m[IU]/L

## 2015-05-07 MED ORDER — FENOFIBRATE 145 MG PO TABS: 145.0000 mg | ORAL_TABLET | Freq: Every day | ORAL | Status: DC

## 2015-05-07 NOTE — Progress Notes (Signed)
Meds ordered this encounter  Medications  . fenofibrate (TRICOR) 145 MG tablet    Sig: Take 1 tablet (145 mg total) by mouth daily.    Dispense:  30 tablet    Refill:  5   Rafeal Skibicki M, FNP

## 2015-05-07 NOTE — Progress Notes (Signed)
Called and left message for patient to return call. Thanks.

## 2015-05-08 NOTE — Progress Notes (Signed)
Called and left message advising patient to call back regarding labs. Thanks!

## 2015-05-08 NOTE — Progress Notes (Signed)
Patient returned call and I advised her of labs and the need to adjust her diet and start tricor daily as prescribe. Made patient aware of cardiology referral and to start 81mg  aspirin daily. Appointment was scheduled for 3 month for follow up. Thanks!

## 2015-06-25 ENCOUNTER — Other Ambulatory Visit: Payer: Self-pay | Admitting: Family Medicine

## 2015-06-25 DIAGNOSIS — K0889 Other specified disorders of teeth and supporting structures: Secondary | ICD-10-CM

## 2015-06-26 ENCOUNTER — Encounter (HOSPITAL_COMMUNITY): Payer: Self-pay | Admitting: Emergency Medicine

## 2015-06-26 ENCOUNTER — Ambulatory Visit (HOSPITAL_COMMUNITY)
Admission: EM | Admit: 2015-06-26 | Discharge: 2015-06-26 | Disposition: A | Discharge status: Home / Self Care | Payer: No Typology Code available for payment source | Attending: Emergency Medicine | Admitting: Emergency Medicine

## 2015-06-26 DIAGNOSIS — K047 Periapical abscess without sinus: Secondary | ICD-10-CM

## 2015-06-26 MED ORDER — IBUPROFEN 600 MG PO TABS: 600.0000 mg | ORAL_TABLET | Freq: Four times a day (QID) | ORAL | Status: DC | PRN

## 2015-06-26 MED ORDER — CLINDAMYCIN HCL 300 MG PO CAPS: 300.0000 mg | ORAL_CAPSULE | Freq: Three times a day (TID) | ORAL | Status: DC

## 2015-06-26 MED ORDER — TRAMADOL HCL 50 MG PO TABS: 50.0000 mg | ORAL_TABLET | Freq: Four times a day (QID) | ORAL | Status: DC | PRN

## 2015-06-26 NOTE — ED Notes (Signed)
Pt is having pain in her right upper jaw.  She had a root canal in the past with a temporary filling.  The tooth has since broken and she started having pain on Monday.  She is currently trying to get an appointment with a dentist.

## 2015-06-26 NOTE — ED Provider Notes (Signed)
CSN: [id number]     Arrival date & time 06/26/15  1256 History   First MD Initiated Contact with Patient 06/26/15 1304     Chief Complaint  Patient presents with  . Dental Pain   (Consider location/radiation/quality/duration/timing/severity/associated sxs/prior Treatment) HPI  She is a 45 year old woman here for evaluation of dental pain. She reports that several years ago she had a root canal done in her right upper molar. She had a temporary filling placed, the lost her insurance before she could get a crown placed. Several months ago, the filling in part of her tooth broke off. Yesterday, she started developing pain and swelling around the tooth. Today, she reports the swelling has extended to her face. She denies any fevers or chills. She did call her primary care doctor for a referral to the dentist. This referral is still pending. She has taken Advil with some improvement. She is also tried warm salt water gargles, peroxide gargles, and Orajel with minimal improvement.  Past Medical History  Diagnosis Date  . Chest pain   . Degenerative joint disease   . Migraine   . Obesity   . Herpes simplex   . Hypertension   . Osteoarthritis of knee   . Migraine without aura, with intractable migraine, so stated, without mention of status migrainosus 07/26/2013  . Normal cardiac stress test 2012  . Hx of echocardiogram 2012    normal   Past Surgical History  Procedure Laterality Date  . Cesarean section    . Knee surgery  2012    right  . Exploration post operative open heart    . Cardiovascular stress test      negative  . Asd repair  [id number]  . Carpal tunnel release     Family History  Problem Relation Age of Onset  . Coronary artery disease Mother 7  . Stroke Mother   . Migraines Mother   . Other       There is also history of diabetes, stroke, and cancer in the family  . Migraines Sister    Social History  Substance Use Topics  . Smoking status: Never Smoker   . Smokeless  tobacco: Never Used  . Alcohol Use: No     Comment: occasional   OB History    No data available     Review of Systems As in history of present illness Allergies  Amoxicillin; Bee venom; Imitrex; and Vicodin  Home Medications   Prior to Admission medications   Medication Sig Start Date End Date Taking? Authorizing Provider  fenofibrate (TRICOR) 145 MG tablet Take 1 tablet (145 mg total) by mouth daily. 05/07/15  Yes Massie Maroon, FNP  albuterol (PROVENTIL HFA;VENTOLIN HFA) 108 (90 Base) MCG/ACT inhaler Inhale 2 puffs into the lungs every 2 (two) hours as needed for wheezing or shortness of breath (cough). Patient not taking: Reported on 05/06/2015 04/04/15   Mercedes Camprubi-Soms, PA-C  Ascorbic Acid (VITAMIN C PO) Take 1,000 mg by mouth daily.     Historical Provider, MD  cetirizine (ZYRTEC) 10 MG tablet Take 1 tablet (10 mg total) by mouth daily. One tab daily for allergies Patient taking differently: Take 10 mg by mouth at bedtime. One tab daily for allergies 04/23/13   Linna Hoff, MD  Cholecalciferol (VITAMIN D3) 5000 UNITS TABS Take 5,000 Units by mouth daily.     Historical Provider, MD  clindamycin (CLEOCIN) 300 MG capsule Take 1 capsule (300 mg total) by mouth 3 (three) times daily.  06/26/15   Charm RingsErin J Emmanuella Mirante, MD  Cyanocobalamin (VITAMIN B 12 PO) Take 1 tablet by mouth daily.    Historical Provider, MD  Echinacea 80 MG CAPS Take by mouth.    Historical Provider, MD  fluticasone (FLONASE) 50 MCG/ACT nasal spray Place 2 sprays into both nostrils at bedtime. 05/06/15   Massie MaroonLachina M Hollis, FNP  ibuprofen (ADVIL,MOTRIN) 600 MG tablet Take 1 tablet (600 mg total) by mouth every 6 (six) hours as needed for moderate pain. 06/26/15   Charm RingsErin J Griselda Bramblett, MD  Magnesium 500 MG TABS Take 2,500 mg by mouth daily.    Historical Provider, MD  Omega-3 Fatty Acids (FISH OIL) 1200 MG CPDR Take 1,200 mg by mouth at bedtime.     Historical Provider, MD  Phenylephrine-DM-GG-APAP (MUCINEX FAST-MAX COLD FLU PO)  Take 1 tablet by mouth daily as needed (for cold and flu symptoms). Reported on 05/06/2015    Historical Provider, MD  rizatriptan (MAXALT-MLT) 10 MG disintegrating tablet Take 1 tablet (10 mg total) by mouth 3 (three) times daily as needed for migraine. May repeat in 2 hours if needed Patient not taking: Reported on 05/06/2015 07/26/13   York Spanielharles K Willis, MD  traMADol (ULTRAM) 50 MG tablet Take 1 tablet (50 mg total) by mouth every 6 (six) hours as needed. 06/26/15   Charm RingsErin J Deona Novitski, MD  vitamin A 8000 UNIT capsule Take 8,000 Units by mouth at bedtime.     Historical Provider, MD  vitamin E 400 UNIT capsule Take 400 Units by mouth at bedtime.     Historical Provider, MD   Meds Ordered and Administered this Visit  Medications - No data to display  BP 141/86 mmHg  Pulse 51  Temp(Src) 98.8 F (37.1 C) (Oral)  SpO2 99% No data found.   Physical Exam  Constitutional: She is oriented to person, place, and time. She appears well-developed and well-nourished. No distress.  HENT:  Mouth/Throat:    She has some mild swelling and erythema of the right cheek as compared to the left.  Neck: Neck supple.  Pulmonary/Chest: Effort normal.  Neurological: She is alert and oriented to person, place, and time.    ED Course  Procedures (including critical care time)  Labs Review Labs Reviewed - No data to display  Imaging Review No results found.    MDM   1. Dental infection    Treat with clindamycin, ibuprofen, and tramadol. Follow-up with the dentist as soon as possible.    Charm RingsErin J Jodie Cavey, MD 06/26/15 1330

## 2015-06-26 NOTE — Discharge Instructions (Signed)
Your broken tooth has become infected. Take clindamycin 3 times a day for 10 days. This medicine may cause some diarrhea. Take ibuprofen 600 mg every 6 hours as needed for pain. Do not take any Aleve or Advil with this medicine. Use the tramadol every 6-8 hours as needed for severe pain. Follow-up with dentist as soon as you can.

## 2015-07-25 ENCOUNTER — Emergency Department (HOSPITAL_COMMUNITY)
Admission: EM | Admit: 2015-07-25 | Discharge: 2015-07-25 | Disposition: A | Discharge status: Home / Self Care | Payer: No Typology Code available for payment source | Attending: Emergency Medicine | Admitting: Emergency Medicine

## 2015-07-25 ENCOUNTER — Encounter (HOSPITAL_COMMUNITY): Payer: Self-pay | Admitting: Emergency Medicine

## 2015-07-25 DIAGNOSIS — Z5321 Procedure and treatment not carried out due to patient leaving prior to being seen by health care provider: Secondary | ICD-10-CM

## 2015-07-25 DIAGNOSIS — R109 Unspecified abdominal pain: Secondary | ICD-10-CM | POA: Insufficient documentation

## 2015-07-25 LAB — COMPREHENSIVE METABOLIC PANEL
ALBUMIN: 4.8 g/dL (ref 3.5–5.0)
ALK PHOS: 63 U/L (ref 38–126)
ALT: 26 U/L (ref 14–54)
ANION GAP: 10 (ref 5–15)
AST: 36 U/L (ref 15–41)
BILIRUBIN TOTAL: 0.6 mg/dL (ref 0.3–1.2)
BUN: 23 mg/dL — ABNORMAL HIGH (ref 6–20)
CALCIUM: 10.1 mg/dL (ref 8.9–10.3)
CO2: 19 mmol/L — ABNORMAL LOW (ref 22–32)
Chloride: 110 mmol/L (ref 101–111)
Creatinine, Ser: 0.86 mg/dL (ref 0.44–1.00)
GLUCOSE: 101 mg/dL — AB (ref 65–99)
POTASSIUM: 4.3 mmol/L (ref 3.5–5.1)
Sodium: 139 mmol/L (ref 135–145)
TOTAL PROTEIN: 7.8 g/dL (ref 6.5–8.1)

## 2015-07-25 LAB — LIPASE, BLOOD: Lipase: 27 U/L (ref 11–51)

## 2015-07-25 LAB — CBC
HEMATOCRIT: 38.1 % (ref 36.0–46.0)
Hemoglobin: 12.6 g/dL (ref 12.0–15.0)
MCH: 27 pg (ref 26.0–34.0)
MCHC: 33.1 g/dL (ref 30.0–36.0)
MCV: 81.8 fL (ref 78.0–100.0)
PLATELETS: 182 10*3/uL (ref 150–400)
RBC: 4.66 MIL/uL (ref 3.87–5.11)
RDW: 13.4 % (ref 11.5–15.5)
WBC: 5.9 10*3/uL (ref 4.0–10.5)

## 2015-07-25 NOTE — ED Notes (Signed)
Unable to locate when called for room x3 

## 2015-07-25 NOTE — ED Notes (Signed)
Called to recheck vital signs no answer.

## 2015-07-25 NOTE — ED Notes (Signed)
Pt. Stated, I started having sharpe pains on my stomach mostly on the right side.

## 2015-07-30 NOTE — ED Provider Notes (Signed)
Patient left without being seen  1. Patient left without being seen      Melene Planan Preethi Scantlebury, DO 07/30/15 1625

## 2015-08-08 ENCOUNTER — Ambulatory Visit: Payer: No Typology Code available for payment source | Admitting: Family Medicine

## 2015-08-09 ENCOUNTER — Other Ambulatory Visit (INDEPENDENT_AMBULATORY_CARE_PROVIDER_SITE_OTHER): Payer: No Typology Code available for payment source

## 2015-08-09 DIAGNOSIS — E781 Pure hyperglyceridemia: Secondary | ICD-10-CM

## 2015-08-09 LAB — LIPID PANEL
CHOL/HDL RATIO: 3.8 ratio (ref <=5.0)
Cholesterol: 161 mg/dL (ref 125–200)
HDL: 42 mg/dL — AB (ref >=46)
LDL CALC: 60 mg/dL (ref <130)
TRIGLYCERIDES: 294 mg/dL — AB (ref <150)
VLDL: 59 mg/dL — AB (ref <30)

## 2015-08-10 ENCOUNTER — Other Ambulatory Visit: Payer: Self-pay | Admitting: Family Medicine

## 2015-08-10 DIAGNOSIS — E781 Pure hyperglyceridemia: Secondary | ICD-10-CM

## 2015-08-10 MED ORDER — FENOFIBRATE 145 MG PO TABS: 145.0000 mg | ORAL_TABLET | Freq: Every day | ORAL | Status: DC

## 2015-08-14 ENCOUNTER — Telehealth: Payer: Self-pay

## 2015-08-14 NOTE — Telephone Encounter (Signed)
-----   Message from Massie MaroonLachina M Hollis, [place] sent at 08/10/2015  1:01 PM EDT ----- Regarding: lab results Please inform Pamela [name] that triglycerides are trending down since starting Tricor 145 mg. Will continue medication at current dosage. Will send refills to pharmacy and will repeat cholesterol panel in 6 months. Recommend a lowfat, low carbohydrate diet divided over 5-6 small meals, increase water intake to 6-8 glasses, and 150 minutes per week of cardiovascular exercise.   Thanks ----- Message -----    From: [facility]    Sent: 08/09/2015  10:53 PM      To: Massie MaroonLachina M Hollis, FNP

## 2015-08-14 NOTE — Telephone Encounter (Signed)
Called and spoke with patient, advised of labs and to continue current medication and diet and exercise. Patient verbalized understanding and appointment was set for 6 months. Thanks!

## 2015-09-10 ENCOUNTER — Ambulatory Visit (HOSPITAL_COMMUNITY)
Admission: EM | Admit: 2015-09-10 | Discharge: 2015-09-10 | Disposition: A | Discharge status: Home / Self Care | Payer: No Typology Code available for payment source | Attending: Family Medicine | Admitting: Family Medicine

## 2015-09-10 ENCOUNTER — Encounter (HOSPITAL_COMMUNITY): Payer: Self-pay | Admitting: Emergency Medicine

## 2015-09-10 DIAGNOSIS — B001 Herpesviral vesicular dermatitis: Secondary | ICD-10-CM

## 2015-09-10 MED ORDER — ACYCLOVIR 5 % EX OINT: 1.0000 "application " | TOPICAL_OINTMENT | CUTANEOUS | 1 refills | Status: DC

## 2015-09-10 NOTE — ED Triage Notes (Signed)
The patient presented to the [facility] with a complaint of recurring cold sores that she stated that she has tried Abreva and Lysine for with no results.

## 2015-09-11 NOTE — ED Provider Notes (Signed)
CSN: [id number]     Arrival date & time 09/10/15  1422 History   First MD Initiated Contact with Patient 09/10/15 1541     Chief Complaint  Patient presents with  . Mouth Lesions   (Consider location/radiation/quality/duration/timing/severity/associated sxs/prior Treatment) HPI 45 year old female patient is here for treatment of cold sores. She states that she has had an outbreak for the past 2 weeks. They seem to come and go. She has been using Abreva at home but it does not appear to be working. She would like something prescribed. She denies any genital herpes infection at this time. Past Medical History:  Diagnosis Date  . Chest pain   . Degenerative joint disease   . Herpes simplex   . Hx of echocardiogram 2012   normal  . Hypertension   . Migraine   . Migraine without aura, with intractable migraine, so stated, without mention of status migrainosus 07/26/2013  . Normal cardiac stress test 2012  . Obesity   . Osteoarthritis of knee    Past Surgical History:  Procedure Laterality Date  . ASD REPAIR  1977  . CARDIOVASCULAR STRESS TEST     negative  . CARPAL TUNNEL RELEASE    . CESAREAN SECTION    . EXPLORATION POST OPERATIVE OPEN HEART    . KNEE SURGERY  2012   right   Family History  Problem Relation Age of Onset  . Coronary artery disease Mother 69  . Stroke Mother   . Migraines Mother   . Other       There is also history of diabetes, stroke, and cancer in the family  . Migraines Sister    Social History  Substance Use Topics  . Smoking status: Never Smoker  . Smokeless tobacco: Never Used  . Alcohol use No     Comment: occasional   OB History    No data available     Review of Systems  Denies: HEADACHE, NAUSEA, ABDOMINAL PAIN, CHEST PAIN, CONGESTION, DYSURIA, SHORTNESS OF BREATH  Allergies  Amoxicillin; Bee venom; Imitrex [sumatriptan]; and Vicodin [hydrocodone-acetaminophen]  Home Medications   Prior to Admission medications   Medication Sig Start  Date End Date Taking? Authorizing Provider  fenofibrate (TRICOR) 145 MG tablet Take 1 tablet (145 mg total) by mouth daily. 08/10/15  Yes Massie Maroon, FNP  acyclovir ointment (ZOVIRAX) 5 % Apply 1 application topically every 3 (three) hours. 09/10/15   Tharon Aquas, PA  albuterol (PROVENTIL HFA;VENTOLIN HFA) 108 (90 Base) MCG/ACT inhaler Inhale 2 puffs into the lungs every 2 (two) hours as needed for wheezing or shortness of breath (cough). Patient not taking: Reported on 05/06/2015 04/04/15   Mercedes Camprubi-Soms, PA-C  Ascorbic Acid (VITAMIN C PO) Take 1,000 mg by mouth daily.     Historical Provider, MD  cetirizine (ZYRTEC) 10 MG tablet Take 1 tablet (10 mg total) by mouth daily. One tab daily for allergies Patient taking differently: Take 10 mg by mouth at bedtime. One tab daily for allergies 04/23/13   Linna Hoff, MD  Cholecalciferol (VITAMIN D3) 5000 UNITS TABS Take 5,000 Units by mouth daily.     Historical Provider, MD  clindamycin (CLEOCIN) 300 MG capsule Take 1 capsule (300 mg total) by mouth 3 (three) times daily. 06/26/15   Charm Rings, MD  Cyanocobalamin (VITAMIN B 12 PO) Take 1 tablet by mouth daily.    Historical Provider, MD  Echinacea 80 MG CAPS Take by mouth.    Historical Provider, MD  fluticasone (FLONASE) 50 MCG/ACT nasal spray Place 2 sprays into both nostrils at bedtime. 05/06/15   Massie Maroon, FNP  ibuprofen (ADVIL,MOTRIN) 600 MG tablet Take 1 tablet (600 mg total) by mouth every 6 (six) hours as needed for moderate pain. 06/26/15   Charm Rings, MD  Magnesium 500 MG TABS Take 2,500 mg by mouth daily.    Historical Provider, MD  Omega-3 Fatty Acids (FISH OIL) 1200 MG CPDR Take 1,200 mg by mouth at bedtime.     Historical Provider, MD  Phenylephrine-DM-GG-APAP (MUCINEX FAST-MAX COLD FLU PO) Take 1 tablet by mouth daily as needed (for cold and flu symptoms). Reported on 05/06/2015    Historical Provider, MD  rizatriptan (MAXALT-MLT) 10 MG disintegrating tablet Take 1  tablet (10 mg total) by mouth 3 (three) times daily as needed for migraine. May repeat in 2 hours if needed Patient not taking: Reported on 05/06/2015 07/26/13   York Spaniel, MD  traMADol (ULTRAM) 50 MG tablet Take 1 tablet (50 mg total) by mouth every 6 (six) hours as needed. 06/26/15   Charm Rings, MD  vitamin A 8000 UNIT capsule Take 8,000 Units by mouth at bedtime.     Historical Provider, MD  vitamin E 400 UNIT capsule Take 400 Units by mouth at bedtime.     Historical Provider, MD   Meds Ordered and Administered this Visit  Medications - No data to display  BP 174/79 (BP Location: Left Arm)   Pulse (!) 48 Comment: Report HR to CMA [facility]. Patient stated that she goes to the cardiologist every three months due to the slow heart rate.  Temp 98.3 F (36.8 C) (Oral)   Resp 16   SpO2 100%  No data found.   Physical Exam NURSES NOTES AND VITAL SIGNS REVIEWED. CONSTITUTIONAL: Well developed, well nourished, no acute distress HEENT: normocephalic, atraumatic, lips there are several small herpetic ulcers noted on the upper and lower lips. There is no signs of cellulitis or abscess formation. EYES: Conjunctiva normal NECK:normal ROM, supple, no adenopathy PULMONARY:No respiratory distress, normal effort ABDOMINAL: Soft, ND, NT BS+, No CVAT MUSCULOSKELETAL: Normal ROM of all extremities,  SKIN: warm and dry without rash PSYCHIATRIC: Mood and affect, behavior are normal  Urgent Care Course   Clinical Course    Procedures (including critical care time)  Labs Review Labs Reviewed - No data to display  Imaging Review No results found.   Visual Acuity Review  Right Eye Distance:   Left Eye Distance:   Bilateral Distance:    Right Eye Near:   Left Eye Near:    Bilateral Near:        Prescription for acyclovir ointment is provided to the patient. I have discussed with her that this medication can be quite expensive and that over-the-counter Abreva work stresses  well most likely. Cold sores are self limiting and should not cause any systemic illness. MDM   1. Recurrent cold sores     Patient is reassured that there are no issues that require transfer to higher level of care at this time or additional tests. Patient is advised to continue home symptomatic treatment. Patient is advised that if there are new or worsening symptoms to attend the emergency department, contact primary care provider, or return to UC. Instructions of care provided discharged home in stable condition.    THIS NOTE WAS GENERATED USING A VOICE RECOGNITION SOFTWARE PROGRAM. ALL REASONABLE EFFORTS  WERE MADE TO PROOFREAD THIS DOCUMENT FOR ACCURACY.  I have verbally reviewed the discharge instructions with the patient. A printed AVS was given to the patient.  All questions were answered prior to discharge.      Tharon Aquas, PA 09/11/15 1121

## 2015-09-27 ENCOUNTER — Ambulatory Visit (INDEPENDENT_AMBULATORY_CARE_PROVIDER_SITE_OTHER): Payer: No Typology Code available for payment source | Admitting: Family Medicine

## 2015-09-27 VITALS — BP 128/59 | HR 54 | Temp 98.9°F | Ht 63.0 in | Wt 240.0 lb

## 2015-09-27 DIAGNOSIS — Z1239 Encounter for other screening for malignant neoplasm of breast: Secondary | ICD-10-CM

## 2015-09-27 MED ORDER — RIZATRIPTAN BENZOATE 10 MG PO TBDP: 10.0000 mg | ORAL_TABLET | Freq: Three times a day (TID) | ORAL | 3 refills | Status: DC | PRN

## 2015-09-27 MED ORDER — ACYCLOVIR 400 MG PO TABS: 400.0000 mg | ORAL_TABLET | Freq: Two times a day (BID) | ORAL | 1 refills | Status: DC

## 2015-09-27 NOTE — Patient Instructions (Signed)
I have prescribed the acyclovir to be taken 2 times a day for prevention Will send in something for migraines.

## 2015-09-27 NOTE — Progress Notes (Signed)
Patient is here for Cold Sores  Patient complains of cold sores reoccurring after using treatment. Symptom has been present for the past two weeks.  Patient has taken medication today and patient has eaten today.

## 2015-09-30 NOTE — Progress Notes (Signed)
Pamela [name], is a 45 y.o. female  JYN:[id number]:[id number]  QMV:[id number]  DOB - 1970/04/26  CC:  Chief Complaint  Patient presents with  . Mouth Lesions       HPI: Pamela [name] is a 45 y.o. female here for follow-up cold sores.  She was seen recently at urgent Care and treated with acyclovir acutely. Since that lesion cleared, she has developed another lesion on her lower lip. She is interested in starting preventive therapy. She also has a history of migraine headaches and wishes to try something different than Imitrex.   Health Maintenance: Declines influenza today. Tdap up to date. Has been screened for HIV and is negative. She reports having a mammogram in 2015 and a PAP in 2014. She does not smoke, drink alcohol or use drugs. Her screening A1C in March was 5.4.   Allergies  Allergen Reactions  . Amoxicillin Nausea Only  . Bee Venom Swelling  . Imitrex [Sumatriptan] Other (See Comments)    Gives chest pains  . Vicodin [Hydrocodone-Acetaminophen] Other (See Comments)    Migraines   Past Medical History:  Diagnosis Date  . Chest pain   . Degenerative joint disease   . Herpes simplex   . Hx of echocardiogram 2012   normal  . Hypertension   . Migraine   . Migraine without aura, with intractable migraine, so stated, without mention of status migrainosus 07/26/2013  . Normal cardiac stress test 2012  . Obesity   . Osteoarthritis of knee    Current Outpatient Prescriptions on File Prior to Visit  Medication Sig Dispense Refill  . acyclovir ointment (ZOVIRAX) 5 % Apply 1 application topically every 3 (three) hours. 5 g 1  . albuterol (PROVENTIL HFA;VENTOLIN HFA) 108 (90 Base) MCG/ACT inhaler Inhale 2 puffs into the lungs every 2 (two) hours as needed for wheezing or shortness of breath (cough). 1 Inhaler 0  . Ascorbic Acid (VITAMIN C PO) Take 1,000 mg by mouth daily.     . cetirizine (ZYRTEC) 10 MG tablet Take 1 tablet (10 mg total) by mouth daily. One tab daily for allergies  (Patient taking differently: Take 10 mg by mouth at bedtime. One tab daily for allergies) 30 tablet 1  . Cholecalciferol (VITAMIN D3) 5000 UNITS TABS Take 5,000 Units by mouth daily.     . clindamycin (CLEOCIN) 300 MG capsule Take 1 capsule (300 mg total) by mouth 3 (three) times daily. 30 capsule 0  . Cyanocobalamin (VITAMIN B 12 PO) Take 1 tablet by mouth daily.    . Echinacea 80 MG CAPS Take by mouth.    . fenofibrate (TRICOR) 145 MG tablet Take 1 tablet (145 mg total) by mouth daily. 30 tablet 5  . fluticasone (FLONASE) 50 MCG/ACT nasal spray Place 2 sprays into both nostrils at bedtime. 16 g 0  . ibuprofen (ADVIL,MOTRIN) 600 MG tablet Take 1 tablet (600 mg total) by mouth every 6 (six) hours as needed for moderate pain. 30 tablet 0  . Magnesium 500 MG TABS Take 2,500 mg by mouth daily.    . Omega-3 Fatty Acids (FISH OIL) 1200 MG CPDR Take 1,200 mg by mouth at bedtime.     Marland Kitchen. Phenylephrine-DM-GG-APAP (MUCINEX FAST-MAX COLD FLU PO) Take 1 tablet by mouth daily as needed (for cold and flu symptoms). Reported on 05/06/2015    . traMADol (ULTRAM) 50 MG tablet Take 1 tablet (50 mg total) by mouth every 6 (six) hours as needed. (Patient not taking: Reported on 09/27/2015) 15 tablet 0  .  vitamin A 8000 UNIT capsule Take 8,000 Units by mouth at bedtime.     . vitamin E 400 UNIT capsule Take 400 Units by mouth at bedtime.      No current facility-administered medications on file prior to visit.    Family History  Problem Relation Age of Onset  . Coronary artery disease Mother 56  . Stroke Mother   . Migraines Mother   . Other       There is also history of diabetes, stroke, and cancer in the family  . Migraines Sister    Social History   Social History  . Marital status: Legally Separated    Spouse name: N/A  . Number of children: 4  . Years of education: college AS   Occupational History  . Teacher [employer]  .  [employer]   Social History Main Topics   . Smoking status: Never Smoker  . Smokeless tobacco: Never Used  . Alcohol use No     Comment: occasional  . Drug use: No  . Sexual activity: Yes    Birth control/ protection: None   Other Topics Concern  . Not on file   Social History Narrative  . No narrative on file    Review of Systems: Constitutional: Negative for fever, chills, appetite change, weight loss,  Fatigue. Skin: Negative for rashes or lesions of concern except for cold sores.  HENT: Negative for ear pain, ear discharge.nose bleeds Eyes: Negative for pain, discharge, redness, itching and visual disturbance. Neck: Negative for pain, stiffness Respiratory: Negative for cough, shortness of breath,   Cardiovascular: Negative for chest pain, palpitations and leg swelling. Gastrointestinal: Negative for abdominal pain, nausea, vomiting, diarrhea, constipations Genitourinary: Negative for dysuria, urgency, frequency, hematuria,  Musculoskeletal: Negative for back pain, joint  swelling, and gait problem.Negative for weakness. Positive for l knee pain and hip pain Neurological: Negative for dizziness, tremors, seizures, syncope,   light-headedness, numbness. Positive for headaches Hematological: Negative for easy bruising or bleeding Psychiatric/Behavioral: Negative for depression, anxiety, decreased concentration, confusion   Objective:   Vitals:   09/27/15 1323  BP: (!) 128/59  Pulse: (!) 54  Temp: 98.9 F (37.2 C)    Physical Exam: Constitutional: Patient appears well-developed and well-nourished. No distress. HENT: Normocephalic, atraumatic, External right and left ear normal. Oropharynx is clear and moist.  Eyes: Conjunctivae and EOM are normal. PERRLA, no scleral icterus. Neck: Normal ROM. Neck supple. No lymphadenopathy, No thyromegaly. CVS: RRR, S1/S2 +, no murmurs, no gallops, no rubs Pulmonary: Effort and breath sounds normal, no stridor, rhonchi, wheezes, rales.  Abdominal: Soft. Normoactive BS,, no  distension, tenderness, rebound or guarding.  Musculoskeletal: Normal range of motion. No edema and no tenderness.  Neuro: Alert.Normal muscle tone coordination. Non-focal Skin: Skin is warm and dry. No rash noted. Not diaphoretic. No erythema. No pallor. Psychiatric: Normal mood and affect. Behavior, judgment, thought content normal.  Lab Results  Component Value Date   WBC 5.9 07/25/2015   HGB 12.6 07/25/2015   HCT 38.1 07/25/2015   MCV 81.8 07/25/2015   PLT 182 07/25/2015   Lab Results  Component Value Date   CREATININE 0.86 07/25/2015   BUN 23 (H) 07/25/2015   NA 139 07/25/2015   K 4.3 07/25/2015   CL 110 07/25/2015   CO2 19 (L) 07/25/2015    Lab Results  Component Value Date   HGBA1C 5.4 05/06/2015   Lipid Panel     Component Value Date/Time   CHOL  161 08/09/2015 1130   TRIG 294 (H) 08/09/2015 1130   HDL 42 (L) 08/09/2015 1130   CHOLHDL 3.8 08/09/2015 1130   VLDL 59 (H) 08/09/2015 1130   LDLCALC 60 08/09/2015 1130       Assessment and plan:   1. Screening for breast cancer  - MM DIGITAL SCREENING BILATERAL; Future  2. Migraine Headaches -Refill of Maxalt per medication list  3. Herpes Simplex  Acyclovir 400 mg #60, one po bid.  Return in about 6 months (around 03/29/2016).  The patient was given clear instructions to go to ER or return to medical center if symptoms don't improve, worsen or new problems develop. The patient verbalized understanding.    Henrietta HooverLinda C Bernhardt FNP  09/30/2015, 4:00 PM

## 2015-10-05 ENCOUNTER — Other Ambulatory Visit: Payer: Self-pay | Admitting: Family Medicine

## 2015-10-05 DIAGNOSIS — Z1231 Encounter for screening mammogram for malignant neoplasm of breast: Secondary | ICD-10-CM

## 2015-10-21 ENCOUNTER — Ambulatory Visit
Admission: RE | Admit: 2015-10-21 | Discharge: 2015-10-21 | Disposition: A | Discharge status: Home / Self Care | Payer: No Typology Code available for payment source | Source: Ambulatory Visit | Attending: Family Medicine | Admitting: Family Medicine

## 2015-10-21 DIAGNOSIS — Z1231 Encounter for screening mammogram for malignant neoplasm of breast: Secondary | ICD-10-CM

## 2015-10-22 ENCOUNTER — Ambulatory Visit (HOSPITAL_COMMUNITY)
Admission: EM | Admit: 2015-10-22 | Discharge: 2015-10-22 | Disposition: A | Discharge status: Home / Self Care | Payer: No Typology Code available for payment source | Attending: Family Medicine | Admitting: Family Medicine

## 2015-10-22 ENCOUNTER — Encounter (HOSPITAL_COMMUNITY): Payer: Self-pay | Admitting: Emergency Medicine

## 2015-10-22 DIAGNOSIS — K047 Periapical abscess without sinus: Secondary | ICD-10-CM

## 2015-10-22 MED ORDER — DICLOFENAC POTASSIUM 50 MG PO TABS: 50.0000 mg | ORAL_TABLET | Freq: Three times a day (TID) | ORAL | 0 refills | Status: DC

## 2015-10-22 MED ORDER — CLINDAMYCIN HCL 300 MG PO CAPS: 300.0000 mg | ORAL_CAPSULE | Freq: Three times a day (TID) | ORAL | 0 refills | Status: DC

## 2015-10-22 NOTE — Discharge Instructions (Signed)
See your dentist asap., take medicine as prescribed

## 2015-10-22 NOTE — ED Provider Notes (Signed)
[facility]    CSN: [id number] Arrival date & time: 10/22/15  1455  First Provider Contact:  First MD Initiated Contact with Patient 10/22/15 1617        History   Chief Complaint Chief Complaint  Patient presents with  . Dental Pain    HPI Pamela [name] is a 45 y.o. female.   The history is provided by the patient.  Dental Pain  Location:  Upper Upper teeth location:  3/RU 1st molar Quality:  Constant Severity:  Moderate Onset quality:  Sudden Progression:  Worsening Chronicity:  Chronic (seen for dental pain of same tooth in 5/17 but no treament since.) Context: abscess, dental caries and dental fracture   Relieved by:  None tried Worsened by:  Nothing Ineffective treatments:  None tried Associated symptoms: no facial swelling and no fever   Risk factors: lack of dental care and periodontal disease     Past Medical History:  Diagnosis Date  . Chest pain   . Degenerative joint disease   . Herpes simplex   . Hx of echocardiogram 2012   normal  . Hypertension   . Migraine   . Migraine without aura, with intractable migraine, so stated, without mention of status migrainosus 07/26/2013  . Normal cardiac stress test 2012  . Obesity   . Osteoarthritis of knee     Patient Active Problem List   Diagnosis Date Noted  . Hypertriglyceridemia 05/07/2015  . Murmur 11/10/2013  . Congenital heart disease 11/10/2013  . Migraine without aura, with intractable migraine, so stated, without mention of status migrainosus 07/26/2013  . Pap smear for cervical cancer screening 09/29/2012  . Morbid obesity (HCC) 09/29/2012  . Polyphagia 09/29/2012  . Chest pain 09/03/2012    Past Surgical History:  Procedure Laterality Date  . ASD REPAIR  1977  . CARDIOVASCULAR STRESS TEST     negative  . CARPAL TUNNEL RELEASE    . CESAREAN SECTION    . EXPLORATION POST OPERATIVE OPEN HEART    . KNEE SURGERY  2012   right    OB History    No data available        Home Medications    Prior to Admission medications   Medication Sig Start Date End Date Taking? Authorizing Provider  acyclovir (ZOVIRAX) 400 MG tablet Take 1 tablet (400 mg total) by mouth 2 (two) times daily. 09/27/15   Henrietta Hoover, NP  acyclovir ointment (ZOVIRAX) 5 % Apply 1 application topically every 3 (three) hours. 09/10/15   Tharon Aquas, PA  albuterol (PROVENTIL HFA;VENTOLIN HFA) 108 (90 Base) MCG/ACT inhaler Inhale 2 puffs into the lungs every 2 (two) hours as needed for wheezing or shortness of breath (cough). 04/04/15   Mercedes Camprubi-Soms, PA-C  Ascorbic Acid (VITAMIN C PO) Take 1,000 mg by mouth daily.     Historical Provider, MD  cetirizine (ZYRTEC) 10 MG tablet Take 1 tablet (10 mg total) by mouth daily. One tab daily for allergies Patient taking differently: Take 10 mg by mouth at bedtime. One tab daily for allergies 04/23/13   Linna Hoff, MD  Cholecalciferol (VITAMIN D3) 5000 UNITS TABS Take 5,000 Units by mouth daily.     Historical Provider, MD  clindamycin (CLEOCIN) 300 MG capsule Take 1 capsule (300 mg total) by mouth 3 (three) times daily. 06/26/15   Charm Rings, MD  Cyanocobalamin (VITAMIN B 12 PO) Take 1 tablet by mouth daily.    Historical Provider, MD  Philippa Chester  80 MG CAPS Take by mouth.    Historical Provider, MD  fenofibrate (TRICOR) 145 MG tablet Take 1 tablet (145 mg total) by mouth daily. 08/10/15   Massie MaroonLachina M Hollis, FNP  fluticasone (FLONASE) 50 MCG/ACT nasal spray Place 2 sprays into both nostrils at bedtime. 05/06/15   Massie MaroonLachina M Hollis, FNP  ibuprofen (ADVIL,MOTRIN) 600 MG tablet Take 1 tablet (600 mg total) by mouth every 6 (six) hours as needed for moderate pain. 06/26/15   Charm RingsErin J Honig, MD  Magnesium 500 MG TABS Take 2,500 mg by mouth daily.    Historical Provider, MD  Omega-3 Fatty Acids (FISH OIL) 1200 MG CPDR Take 1,200 mg by mouth at bedtime.     Historical Provider, MD  Phenylephrine-DM-GG-APAP (MUCINEX FAST-MAX COLD FLU PO) Take 1 tablet  by mouth daily as needed (for cold and flu symptoms). Reported on 05/06/2015    Historical Provider, MD  rizatriptan (MAXALT-MLT) 10 MG disintegrating tablet Take 1 tablet (10 mg total) by mouth 3 (three) times daily as needed for migraine. May repeat in 2 hours if needed 09/27/15   Henrietta HooverLinda C Bernhardt, NP  traMADol (ULTRAM) 50 MG tablet Take 1 tablet (50 mg total) by mouth every 6 (six) hours as needed. Patient not taking: Reported on 09/27/2015 06/26/15   Charm RingsErin J Honig, MD  vitamin A 8000 UNIT capsule Take 8,000 Units by mouth at bedtime.     Historical Provider, MD  vitamin E 400 UNIT capsule Take 400 Units by mouth at bedtime.     Historical Provider, MD    Family History Family History  Problem Relation Age of Onset  . Coronary artery disease Mother 7146  . Stroke Mother   . Migraines Mother   . Migraines Sister   . Other       There is also history of diabetes, stroke, and cancer in the family    Social History Social History  Substance Use Topics  . Smoking status: Never Smoker  . Smokeless tobacco: Never Used  . Alcohol use No     Comment: occasional     Allergies   Amoxicillin; Bee venom; Imitrex [sumatriptan]; and Vicodin [hydrocodone-acetaminophen]   Review of Systems Review of Systems  Constitutional: Negative for fever.  HENT: Positive for dental problem. Negative for facial swelling.   All other systems reviewed and are negative.    Physical Exam Triage Vital Signs ED Triage Vitals [10/22/15 1538]  Enc Vitals Group     BP 156/76     Pulse Rate (!) 52     Resp 16     Temp 98.1 F (36.7 C)     Temp Source Oral     SpO2 100 %     Weight      Height      Head Circumference      Peak Flow      Pain Score      Pain Loc      Pain Edu?      Excl. in GC?    No data found.   Updated Vital Signs BP 156/76 (BP Location: Right Arm)   Pulse (!) 52   Temp 98.1 F (36.7 C) (Oral)   Resp 16   SpO2 100%   Visual Acuity Right Eye Distance:   Left Eye  Distance:   Bilateral Distance:    Right Eye Near:   Left Eye Near:    Bilateral Near:     Physical Exam  Constitutional: She appears well-developed and  well-nourished.  HENT:  Mouth/Throat: Mucous membranes are normal. Abnormal dentition. Dental abscesses and dental caries present.    Nursing note and vitals reviewed.    UC Treatments / Results  Labs (all labs ordered are listed, but only abnormal results are displayed) Labs Reviewed - No data to display  EKG  EKG Interpretation None       Radiology Ms Digital Screening Bilateral  Result Date: 10/22/2015 CLINICAL DATA:  Screening. EXAM: DIGITAL SCREENING BILATERAL MAMMOGRAM WITH CAD COMPARISON:  Previous exam(s). ACR Breast Density Category a: The breast tissue is almost entirely fatty. FINDINGS: There are no findings suspicious for malignancy. Images were processed with CAD. IMPRESSION: No mammographic evidence of malignancy. A result letter of this screening mammogram will be mailed directly to the patient. RECOMMENDATION: Screening mammogram in one year. (Code:SM-B-01Y) BI-RADS CATEGORY  1: Negative. Electronically Signed   By: Rolla Plate M.D.   On: 10/22/2015 13:00    Procedures Procedures (including critical care time)  Medications Ordered in UC Medications - No data to display   Initial Impression / Assessment and Plan / UC Course  I have reviewed the triage vital signs and the nursing notes.  Pertinent labs & imaging results that were available during my care of the patient were reviewed by me and considered in my medical decision making (see chart for details).  Clinical Course      Final Clinical Impressions(s) / UC Diagnoses   Final diagnoses:  None    New Prescriptions New Prescriptions   No medications on file     Linna Hoff, MD 10/22/15 1706

## 2015-10-25 ENCOUNTER — Ambulatory Visit: Payer: No Typology Code available for payment source | Admitting: Family Medicine

## 2015-11-22 ENCOUNTER — Encounter: Payer: Self-pay | Admitting: Family Medicine

## 2015-11-22 ENCOUNTER — Ambulatory Visit (INDEPENDENT_AMBULATORY_CARE_PROVIDER_SITE_OTHER): Payer: No Typology Code available for payment source | Admitting: Family Medicine

## 2015-11-22 ENCOUNTER — Other Ambulatory Visit: Payer: Self-pay | Admitting: Family Medicine

## 2015-11-22 VITALS — BP 135/74 | HR 47 | Temp 98.4°F | Resp 18 | Ht 62.0 in | Wt 239.0 lb

## 2015-11-22 DIAGNOSIS — B009 Herpesviral infection, unspecified: Secondary | ICD-10-CM

## 2015-11-22 DIAGNOSIS — Z23 Encounter for immunization: Secondary | ICD-10-CM

## 2015-11-22 DIAGNOSIS — R109 Unspecified abdominal pain: Secondary | ICD-10-CM

## 2015-11-22 DIAGNOSIS — Z01419 Encounter for gynecological examination (general) (routine) without abnormal findings: Secondary | ICD-10-CM

## 2015-11-22 MED ORDER — ACYCLOVIR 400 MG PO TABS: ORAL_TABLET | ORAL | 1 refills | Status: DC

## 2015-11-22 NOTE — Progress Notes (Signed)
Patient is here for PAP  Patient complains of epigastric pain being present after eating. Patient complains of appetite decreasing due to pain.  Patient tolerated the Tdap vaccine well today.   .Marland Kitchen

## 2015-11-22 NOTE — Patient Instructions (Signed)
Return for your regular follow-up in January as planned.

## 2015-11-25 ENCOUNTER — Encounter: Payer: Self-pay | Admitting: Family Medicine

## 2015-11-28 LAB — PAP W/AGE BASED SCRN W/CT/NG/TRICH

## 2015-11-28 LAB — PAP, TP IMAGING W/ CT/GC AND W/ HPV RNA, RFLX HPV TYPE 16/18
Chlamydia Probe Amp: NOT DETECTED
GC Probe Amp: NOT DETECTED

## 2015-11-28 LAB — T. VAGINALIS RNA, QL TMA, PAP VIAL: T. vaginalis RNA, QL TMA: NOT DETECTED

## 2015-11-29 LAB — HPV MRNA, HIGH RISK, RFLX 16,18/45: HPV mRNA, High Risk: NOT DETECTED

## 2015-12-09 ENCOUNTER — Ambulatory Visit (HOSPITAL_COMMUNITY): Payer: No Typology Code available for payment source

## 2015-12-09 ENCOUNTER — Ambulatory Visit (HOSPITAL_COMMUNITY)
Admission: RE | Admit: 2015-12-09 | Discharge: 2015-12-09 | Disposition: A | Discharge status: Home / Self Care | Payer: No Typology Code available for payment source | Source: Ambulatory Visit | Attending: Family Medicine | Admitting: Family Medicine

## 2015-12-09 DIAGNOSIS — R109 Unspecified abdominal pain: Secondary | ICD-10-CM | POA: Insufficient documentation

## 2015-12-09 DIAGNOSIS — K8020 Calculus of gallbladder without cholecystitis without obstruction: Secondary | ICD-10-CM | POA: Insufficient documentation

## 2016-01-24 ENCOUNTER — Encounter (HOSPITAL_COMMUNITY): Payer: Self-pay

## 2016-01-24 ENCOUNTER — Ambulatory Visit (HOSPITAL_COMMUNITY)
Admission: EM | Admit: 2016-01-24 | Discharge: 2016-01-24 | Disposition: A | Discharge status: Home / Self Care | Payer: No Typology Code available for payment source | Attending: Internal Medicine | Admitting: Internal Medicine

## 2016-01-24 DIAGNOSIS — K0889 Other specified disorders of teeth and supporting structures: Secondary | ICD-10-CM

## 2016-01-24 MED ORDER — KETOROLAC TROMETHAMINE 60 MG/2ML IM SOLN
60.0000 mg | Freq: Once | INTRAMUSCULAR | Status: AC
  Administered 2016-01-24: 60 mg via INTRAMUSCULAR

## 2016-01-24 MED ORDER — CLINDAMYCIN HCL 300 MG PO CAPS: 300.0000 mg | ORAL_CAPSULE | Freq: Three times a day (TID) | ORAL | 0 refills | Status: DC

## 2016-01-24 MED ORDER — NAPROXEN 500 MG PO TABS: 500.0000 mg | ORAL_TABLET | Freq: Two times a day (BID) | ORAL | 0 refills | Status: DC

## 2016-01-24 MED ORDER — KETOROLAC TROMETHAMINE 60 MG/2ML IM SOLN
INTRAMUSCULAR | Status: AC
  Filled 2016-01-24: qty 2

## 2016-01-24 NOTE — ED Triage Notes (Signed)
[facility] of [facility] x4 days top right side, patient is on a waiting list for a oral surgeon to examine her tooth, but would like some relief she has been taking diclofenac potassium 50 mg but still has no relief

## 2016-01-24 NOTE — ED Provider Notes (Signed)
CSN: [id number]     Arrival date & time 01/24/16  1457 History   None    Chief Complaint  Patient presents with  . Dental Pain   (Consider location/radiation/quality/duration/timing/severity/associated sxs/prior Treatment) Patient presents to [facility] with c/o tooth ache right upper molar.  Patient is on waiting list to get dental care at a dentist office.     The history is provided by the patient.  Dental Pain  Location:  Upper Upper teeth location:  4/RU 2nd bicuspid Quality:  Aching Severity:  Severe Duration:  4 days Timing:  Constant Progression:  Worsening Chronicity:  New Context: dental fracture   Previous work-up:  Dental exam Relieved by:  Nothing Worsened by:  Nothing Ineffective treatments:  Acetaminophen and NSAIDs   Past Medical History:  Diagnosis Date  . Chest pain   . Degenerative joint disease   . Herpes simplex   . Hx of echocardiogram 2012   normal  . Hypertension   . Migraine   . Migraine without aura, with intractable migraine, so stated, without mention of status migrainosus 07/26/2013  . Normal cardiac stress test 2012  . Obesity   . Osteoarthritis of knee    Past Surgical History:  Procedure Laterality Date  . ASD REPAIR  1977  . CARDIOVASCULAR STRESS TEST     negative  . CARPAL TUNNEL RELEASE    . CESAREAN SECTION    . EXPLORATION POST OPERATIVE OPEN HEART    . KNEE SURGERY  2012   right   Family History  Problem Relation Age of Onset  . Coronary artery disease Mother 51  . Stroke Mother   . Migraines Mother   . Migraines Sister   . Other       There is also history of diabetes, stroke, and cancer in the family   Social History  Substance Use Topics  . Smoking status: Never Smoker  . Smokeless tobacco: Never Used  . Alcohol use No     Comment: occasional   OB History    No data available     Review of Systems  Constitutional: Negative.   HENT: Positive for dental problem.   Eyes: Negative.   Respiratory: Negative.    Cardiovascular: Negative.   Gastrointestinal: Negative.   Endocrine: Negative.   Genitourinary: Negative.   Musculoskeletal: Negative.   Allergic/Immunologic: Negative.   Neurological: Negative.   Hematological: Negative.   Psychiatric/Behavioral: Negative.     Allergies  Amoxicillin; Bee venom; Imitrex [sumatriptan]; and Vicodin [hydrocodone-acetaminophen]  Home Medications   Prior to Admission medications   Medication Sig Start Date End Date Taking? Authorizing Provider  Ascorbic Acid (VITAMIN C PO) Take 1,000 mg by mouth daily.    Yes Historical Provider, MD  cetirizine (ZYRTEC) 10 MG tablet Take 1 tablet (10 mg total) by mouth daily. One tab daily for allergies Patient taking differently: Take 10 mg by mouth at bedtime. One tab daily for allergies 04/23/13  Yes Linna Hoff, MD  Cholecalciferol (VITAMIN D3) 5000 UNITS TABS Take 5,000 Units by mouth daily.    Yes Historical Provider, MD  Cyanocobalamin (VITAMIN B 12 PO) Take 1 tablet by mouth daily.   Yes Historical Provider, MD  diclofenac (CATAFLAM) 50 MG tablet Take 1 tablet (50 mg total) by mouth 3 (three) times daily. For dental pain 10/22/15  Yes Linna Hoff, MD  Echinacea 80 MG CAPS Take by mouth.   Yes Historical Provider, MD  fenofibrate (TRICOR) 145 MG tablet Take 1 tablet (145  mg total) by mouth daily. 08/10/15  Yes Massie MaroonLachina M Hollis, FNP  fluticasone (FLONASE) 50 MCG/ACT nasal spray Place 2 sprays into both nostrils at bedtime. 05/06/15  Yes Massie MaroonLachina M Hollis, FNP  Magnesium 500 MG TABS Take 2,500 mg by mouth daily.   Yes Historical Provider, MD  Omega-3 Fatty Acids (FISH OIL) 1200 MG CPDR Take 1,200 mg by mouth at bedtime.    Yes Historical Provider, MD  vitamin A 8000 UNIT capsule Take 8,000 Units by mouth at bedtime.    Yes Historical Provider, MD  vitamin E 400 UNIT capsule Take 400 Units by mouth at bedtime.    Yes Historical Provider, MD  acyclovir (ZOVIRAX) 400 MG tablet Take 400 mg three times a day for 10 days  11/22/15   Henrietta HooverLinda C Bernhardt, NP  acyclovir ointment (ZOVIRAX) 5 % Apply 1 application topically every 3 (three) hours. 09/10/15   Tharon AquasFrank C Patrick, PA  albuterol (PROVENTIL HFA;VENTOLIN HFA) 108 (90 Base) MCG/ACT inhaler Inhale 2 puffs into the lungs every 2 (two) hours as needed for wheezing or shortness of breath (cough). 04/04/15   Mercedes Camprubi-Soms, PA-C  clindamycin (CLEOCIN) 300 MG capsule Take 1 capsule (300 mg total) by mouth 3 (three) times daily. 10/22/15   Linna HoffJames D Kindl, MD  clindamycin (CLEOCIN) 300 MG capsule Take 1 capsule (300 mg total) by mouth 3 (three) times daily. 01/24/16   Deatra CanterWilliam J Iram Astorino, FNP  ibuprofen (ADVIL,MOTRIN) 600 MG tablet Take 1 tablet (600 mg total) by mouth every 6 (six) hours as needed for moderate pain. 06/26/15   Charm RingsErin J Honig, MD  naproxen (NAPROSYN) 500 MG tablet Take 1 tablet (500 mg total) by mouth 2 (two) times daily with a meal. 01/24/16   Deatra CanterWilliam J Armelia Penton, FNP  Phenylephrine-DM-GG-APAP (MUCINEX FAST-MAX COLD FLU PO) Take 1 tablet by mouth daily as needed (for cold and flu symptoms). Reported on 05/06/2015    Historical Provider, MD  rizatriptan (MAXALT-MLT) 10 MG disintegrating tablet Take 1 tablet (10 mg total) by mouth 3 (three) times daily as needed for migraine. May repeat in 2 hours if needed 09/27/15   Henrietta HooverLinda C Bernhardt, NP  traMADol (ULTRAM) 50 MG tablet Take 1 tablet (50 mg total) by mouth every 6 (six) hours as needed. 06/26/15   Charm RingsErin J Honig, MD   Meds Ordered and Administered this Visit   Medications  ketorolac (TORADOL) injection 60 mg (60 mg Intramuscular Given 01/24/16 1556)    BP 128/84 (BP Location: Right Arm)   Pulse 60   Temp 99 F (37.2 C) (Oral)   Resp 16   SpO2 98%  No data found.   Physical Exam  Constitutional: She appears well-developed and well-nourished.  HENT:  Head: Normocephalic and atraumatic.  Right upper molar cracked/ fractured and tender with palpation.  Eyes: Conjunctivae and EOM are normal. Pupils are equal,  round, and reactive to light.  Neck: Normal range of motion. Neck supple.  Cardiovascular: Normal rate, regular rhythm and normal heart sounds.   Pulmonary/Chest: Effort normal and breath sounds normal.  Abdominal: Soft. Bowel sounds are normal.  Nursing note and vitals reviewed.   Urgent Care Course   Clinical Course     Procedures (including critical care time)  Labs Review Labs Reviewed - No data to display  Imaging Review No results found.   Visual Acuity Review  Right Eye Distance:   Left Eye Distance:   Bilateral Distance:    Right Eye Near:   Left Eye Near:  Bilateral Near:         MDM   1. Pain, dental    Toradol 60mg  IM Naprosyn 500mg  one po bid x 10 days prn pain Amoxicillin 875mg  one po bid x 10 days #20      Deatra CanterWilliam J Jamaine Quintin, FNP 01/24/16 1621

## 2016-01-27 NOTE — Progress Notes (Signed)
Pamela [name], is a 45 y.o. female  ZOX:[id number]:[id number]  [id number]  DOB - 04/16/1970  CC:  Chief Complaint  Patient presents with  . Gynecologic Exam       HPI: Pamela [name] is a 45 y.o. female here for PAP only. Other health maintenance issued addressed at recent visit. She would like to be screened for STIs. She denies, premenstural bleeding, unusual discharge, pain.   Allergies  Allergen Reactions  . Amoxicillin Nausea Only  . Bee Venom Swelling  . Imitrex [Sumatriptan] Other (See Comments)    Gives chest pains  . Vicodin [Hydrocodone-Acetaminophen] Other (See Comments)    Migraines   Past Medical History:  Diagnosis Date  . Chest pain   . Degenerative joint disease   . Herpes simplex   . Hx of echocardiogram 2012   normal  . Hypertension   . Migraine   . Migraine without aura, with intractable migraine, so stated, without mention of status migrainosus 07/26/2013  . Normal cardiac stress test 2012  . Obesity   . Osteoarthritis of knee    Current Outpatient Prescriptions on File Prior to Visit  Medication Sig Dispense Refill  . acyclovir ointment (ZOVIRAX) 5 % Apply 1 application topically every 3 (three) hours. 5 g 1  . albuterol (PROVENTIL HFA;VENTOLIN HFA) 108 (90 Base) MCG/ACT inhaler Inhale 2 puffs into the lungs every 2 (two) hours as needed for wheezing or shortness of breath (cough). 1 Inhaler 0  . Ascorbic Acid (VITAMIN C PO) Take 1,000 mg by mouth daily.     . cetirizine (ZYRTEC) 10 MG tablet Take 1 tablet (10 mg total) by mouth daily. One tab daily for allergies (Patient taking differently: Take 10 mg by mouth at bedtime. One tab daily for allergies) 30 tablet 1  . Cholecalciferol (VITAMIN D3) 5000 UNITS TABS Take 5,000 Units by mouth daily.     . clindamycin (CLEOCIN) 300 MG capsule Take 1 capsule (300 mg total) by mouth 3 (three) times daily. 21 capsule 0  . Cyanocobalamin (VITAMIN B 12 PO) Take 1 tablet by mouth daily.    . diclofenac (CATAFLAM) 50  MG tablet Take 1 tablet (50 mg total) by mouth 3 (three) times daily. For dental pain 30 tablet 0  . Echinacea 80 MG CAPS Take by mouth.    . fenofibrate (TRICOR) 145 MG tablet Take 1 tablet (145 mg total) by mouth daily. 30 tablet 5  . fluticasone (FLONASE) 50 MCG/ACT nasal spray Place 2 sprays into both nostrils at bedtime. 16 g 0  . ibuprofen (ADVIL,MOTRIN) 600 MG tablet Take 1 tablet (600 mg total) by mouth every 6 (six) hours as needed for moderate pain. 30 tablet 0  . Magnesium 500 MG TABS Take 2,500 mg by mouth daily.    . Omega-3 Fatty Acids (FISH OIL) 1200 MG CPDR Take 1,200 mg by mouth at bedtime.     Marland Kitchen. Phenylephrine-DM-GG-APAP (MUCINEX FAST-MAX COLD FLU PO) Take 1 tablet by mouth daily as needed (for cold and flu symptoms). Reported on 05/06/2015    . traMADol (ULTRAM) 50 MG tablet Take 1 tablet (50 mg total) by mouth every 6 (six) hours as needed. 15 tablet 0  . vitamin A 8000 UNIT capsule Take 8,000 Units by mouth at bedtime.     . vitamin E 400 UNIT capsule Take 400 Units by mouth at bedtime.     . rizatriptan (MAXALT-MLT) 10 MG disintegrating tablet Take 1 tablet (10 mg total) by mouth 3 (three) times daily as  needed for migraine. May repeat in 2 hours if needed 10 tablet 3   No current facility-administered medications on file prior to visit.    Family History  Problem Relation Age of Onset  . Coronary artery disease Mother 4046  . Stroke Mother   . Migraines Mother   . Migraines Sister   . Other       There is also history of diabetes, stroke, and cancer in the family   Social History   Social History  . Marital status: Legally Separated    Spouse name: N/A  . Number of children: 4  . Years of education: college AS   Occupational History  . [employer]  .  [employer]   Social History Main Topics  . Smoking status: Never Smoker  . Smokeless tobacco: Never Used  . Alcohol use No     Comment: occasional  . Drug use: No  .  Sexual activity: Yes    Birth control/ protection: None   Other Topics Concern  . Not on file   Social History Narrative  . No narrative on file    Review of Systems: See HPI  Objective:   Vitals:   11/22/15 1526  BP: 135/74  Pulse: (!) 47  Resp: 18  Temp: 98.4 F (36.9 C)    Physical Exam:  GYN: No CMT, No adenexal tenderness. No unusual discharge.  Lab Results  Component Value Date   CREATININE 0.86 07/25/2015   BUN 23 (H) 07/25/2015   NA 139 07/25/2015   K 4.3 07/25/2015   CL 110 07/25/2015   CO2 19 (L) 07/25/2015    Lab Results  Component Value Date   HGBA1C 5.4 05/06/2015   Lipid Panel     Component Value Date/Time   CHOL 161 08/09/2015 1130   TRIG 294 (H) 08/09/2015 1130   HDL 42 (L) 08/09/2015 1130   CHOLHDL 3.8 08/09/2015 1130   VLDL 59 (H) 08/09/2015 1130   LDLCALC 60 08/09/2015 1130        Assessment and plan:   1. Well woman exam  - PAP, Thin Prep w/HPV rflx HPV Type 16/18 ([facility])  2. Abdominal pain, unspecified abdominal location  - US Abdomen Complete; Future  3. Need for Tdap vaccination  - Tdap vaccine greater than or equal to 7yo IM  4. HSV-2 infection  - acyclovir (ZOVIRAX) 400 MG tablet; Take 400 mg three times a day for 10 days  Dispense: 30 tablet; Refill: 1   Return in about 3 months (around 02/22/2016).  The patient was given clear instructions to go to ER or return to medical center if symptoms don't improve, worsen or new problems develop. The patient verbalized understanding.    Henrietta HooverLinda C Bernhardt FNP  01/27/2016, 1:52 PM

## 2016-02-21 ENCOUNTER — Ambulatory Visit (INDEPENDENT_AMBULATORY_CARE_PROVIDER_SITE_OTHER): Payer: Self-pay | Admitting: Family Medicine

## 2016-02-21 ENCOUNTER — Encounter: Payer: Self-pay | Admitting: Family Medicine

## 2016-02-21 DIAGNOSIS — E8881 Metabolic syndrome: Secondary | ICD-10-CM

## 2016-02-21 DIAGNOSIS — E559 Vitamin D deficiency, unspecified: Secondary | ICD-10-CM

## 2016-02-21 DIAGNOSIS — E781 Pure hyperglyceridemia: Secondary | ICD-10-CM

## 2016-02-21 DIAGNOSIS — I10 Essential (primary) hypertension: Secondary | ICD-10-CM

## 2016-02-21 DIAGNOSIS — Z114 Encounter for screening for human immunodeficiency virus [HIV]: Secondary | ICD-10-CM

## 2016-02-21 DIAGNOSIS — R001 Bradycardia, unspecified: Secondary | ICD-10-CM | POA: Insufficient documentation

## 2016-02-21 LAB — POCT URINALYSIS DIP (DEVICE)
BILIRUBIN URINE: NEGATIVE
GLUCOSE, UA: NEGATIVE mg/dL
Hgb urine dipstick: NEGATIVE
Ketones, ur: NEGATIVE mg/dL
LEUKOCYTES UA: NEGATIVE
NITRITE: NEGATIVE
Protein, ur: 100 mg/dL — AB
Specific Gravity, Urine: >=1.03 (ref 1.005–1.030)
Urobilinogen, UA: 0.2 mg/dL (ref 0.0–1.0)
pH: 5.5 (ref 5.0–8.0)

## 2016-02-21 LAB — LIPID PANEL
CHOL/HDL RATIO: 4 ratio (ref <5.0)
CHOLESTEROL: 159 mg/dL (ref <200)
HDL: 40 mg/dL — AB (ref >50)
LDL Cholesterol: 61 mg/dL (ref <100)
Triglycerides: 291 mg/dL — ABNORMAL HIGH (ref <150)
VLDL: 58 mg/dL — AB (ref <30)

## 2016-02-21 LAB — COMPLETE METABOLIC PANEL WITH GFR
ALBUMIN: 4.4 g/dL (ref 3.6–5.1)
ALK PHOS: 75 U/L (ref 33–115)
ALT: 21 U/L (ref 6–29)
AST: 25 U/L (ref 10–30)
BUN: 17 mg/dL (ref 7–25)
CALCIUM: 9.6 mg/dL (ref 8.6–10.2)
CO2: 27 mmol/L (ref 20–31)
Chloride: 107 mmol/L (ref 98–110)
Creat: 0.64 mg/dL (ref 0.50–1.10)
Glucose, Bld: 91 mg/dL (ref 65–99)
POTASSIUM: 4.7 mmol/L (ref 3.5–5.3)
Sodium: 139 mmol/L (ref 135–146)
Total Bilirubin: 0.5 mg/dL (ref 0.2–1.2)
Total Protein: 7.5 g/dL (ref 6.1–8.1)

## 2016-02-21 LAB — POCT GLYCOSYLATED HEMOGLOBIN (HGB A1C): HEMOGLOBIN A1C: 4.9

## 2016-02-21 NOTE — Progress Notes (Signed)
Subjective:    Patient ID: Pamela [name], female    DOB: Jul 12, 1970, 46 y.o.   MRN: [id number]  HPI  [name], a 46 year old female with a history of hyperlipidemia, morbid obesity, and vitamin D deficiency presents for a follow up of chronic conditions. [name] has been following a low fat, low carbohydrate diet. She says that she has not been doing cardiovascular exercises due to time constraints. She maintains that she has been taking medications consistently. She denies chest pain, shortness of breath, fatigue, nausea, vomiting, or diarrhea. Patient's blood pressure has been mildly elevated. She is followed by cardiology for bradycardia. She is not taking anti-hypertensive medications.   Past Medical History:  Diagnosis Date  . Chest pain   . Degenerative joint disease   . Herpes simplex   . Hx of echocardiogram 2012   normal  . Hypertension   . Migraine   . Migraine without aura, with intractable migraine, so stated, without mention of status migrainosus 07/26/2013  . Normal cardiac stress test 2012  . Obesity   . Osteoarthritis of knee    Social History   Social History  . Marital status: Legally Separated    Spouse name: N/A  . Number of children: 4  . Years of education: college AS   Occupational History  . Teacher [employer]  .  [name]   Social History Main Topics  . Smoking status: Never Smoker  . Smokeless tobacco: Never Used  . Alcohol use No     Comment: occasional  . Drug use: No  . Sexual activity: Yes    Birth control/ protection: None   Other Topics Concern  . Not on file   Social History Narrative  . No narrative on file   Immunization History  Administered Date(s) Administered  . Tdap 11/22/2015   Review of Systems  Constitutional: Negative.   HENT: Negative.   Eyes: Negative.   Respiratory: Negative.  Negative for cough, chest tightness and shortness of breath.   Cardiovascular:  Negative.   Gastrointestinal: Positive for abdominal pain (occasionally with high fat meals).  Endocrine: Negative.   Genitourinary: Negative.   Musculoskeletal: Negative.   Skin: Negative.   Allergic/Immunologic: Negative.   Neurological: Negative.   Hematological: Negative.   Psychiatric/Behavioral: Negative.        Objective:   Physical Exam  Constitutional: She is oriented to person, place, and time. She appears well-developed and well-nourished.  HENT:  Head: Normocephalic and atraumatic.  Right Ear: External ear normal.  Left Ear: External ear normal.  Nose: Nose normal.  Mouth/Throat: Oropharynx is clear and moist.  Eyes: Conjunctivae and EOM are normal. Pupils are equal, round, and reactive to light.  Neck: Normal range of motion. Neck supple.  Cardiovascular: Regular rhythm, normal heart sounds and intact distal pulses.  Bradycardia present.   Pulses:      Carotid pulses are 2+ on the right side, and 2+ on the left side.      Radial pulses are 2+ on the right side, and 2+ on the left side.       Femoral pulses are 2+ on the right side, and 2+ on the left side.      Popliteal pulses are 2+ on the right side, and 2+ on the left side.       Dorsalis pedis pulses are 2+ on the right side, and 2+ on the left side.  Posterior tibial pulses are 2+ on the right side, and 2+ on the left side.  Pulmonary/Chest: Effort normal and breath sounds normal.  Abdominal: Soft. Bowel sounds are normal.  Abdominal obesity  Musculoskeletal: Normal range of motion.  Neurological: She is alert and oriented to person, place, and time. She has normal reflexes.  Skin: Skin is warm and dry.  Psychiatric: She has a normal mood and affect. Her behavior is normal. Judgment and thought content normal.      BP (!) 149/66 (BP Location: Right Arm, Patient Position: Sitting, Cuff Size: Large)   Pulse (!) 42   Temp 98.8 F (37.1 C) (Oral)   Resp 14   Ht 5\' 2"  (1.575 m)   Wt 234 lb (106.1 kg)    SpO2 99%   BMI 42.80 kg/m  Assessment & Plan:  1. Hypertriglyceridemia The patient is asked to make an attempt to improve diet and exercise patterns to aid in medical management of this problem. - Lipid Panel - COMPLETE METABOLIC PANEL WITH GFR  2. Morbid obesity (HCC) Hemoglobin A1C has decreased to 4.9. Continue lowfat, low carbohydrate diet.  - HgB A1c  3. Metabolic syndrome Recommend a lowfat, low carbohydrate diet divided over 5-6 small meals, increase water intake to 6-8 glasses, and 150 minutes per week of cardiovascular exercise.   - COMPLETE METABOLIC PANEL WITH GFR - HgB N8GA1c  4. Vitamin D deficiency  - Vitamin D, 25-hydroxy  5. Bradycardia Patient has a follow up appointment scheduled with cardiology for bradycardia. Current pulse is 42.   6. Essential hypertension Will defer to cardiology for further workup and evaluation due to bradycardia   7. Screening for HIV (human immunodeficiency virus)  - HIV antibody (with reflex)   RTC: 6 months for hypertriglyceridemia   Pamela [name] M, FNP    The patient was given clear instructions to go to ER or return to medical center if symptoms do not improve, worsen or new problems develop. The patient verbalized understanding. Will notify patient with laboratory results.

## 2016-02-21 NOTE — Patient Instructions (Addendum)
Food Choices to Lower Your Triglycerides Triglycerides are a type of fat in your blood. High levels of triglycerides can increase the risk of heart disease and stroke. If your triglyceride levels are high, the foods you eat and your eating habits are very important. Choosing the right foods can help lower your triglycerides. What general guidelines do I need to follow?  Lose weight if you are overweight.  Limit or avoid alcohol.  Fill one half of your plate with vegetables and green salads.  Limit fruit to two servings a day. Choose fruit instead of juice.  Make one fourth of your plate whole grains. Look for the word "whole" as the first word in the ingredient list.  Fill one fourth of your plate with lean protein foods.  Enjoy fatty fish (such as salmon, mackerel, sardines, and tuna) three times a week.  Choose healthy fats.  Limit foods high in starch and sugar.  Eat more home-cooked food and less restaurant, buffet, and fast food.  Limit fried foods.  Cook foods using methods other than frying.  Limit saturated fats.  Check ingredient lists to avoid foods with partially hydrogenated oils (trans fats) in them. What foods can I eat? Grains  Whole grains, such as whole wheat or whole grain breads, crackers, cereals, and pasta. Unsweetened oatmeal, bulgur, barley, quinoa, or brown rice. Corn or whole wheat flour tortillas. Vegetables  Fresh or frozen vegetables (raw, steamed, roasted, or grilled). Green salads. Fruits  All fresh, canned (in natural juice), or frozen fruits. Meat and Other Protein Products  Ground beef (85% or leaner), grass-fed beef, or beef trimmed of fat. Skinless chicken or turkey. Ground chicken or turkey. Pork trimmed of fat. All fish and seafood. Eggs. Dried beans, peas, or lentils. Unsalted nuts or seeds. Unsalted canned or dry beans. Dairy  Low-fat dairy products, such as skim or 1% milk, 2% or reduced-fat cheeses, low-fat ricotta or cottage cheese,  or plain low-fat yogurt. Fats and Oils  Tub margarines without trans fats. Light or reduced-fat mayonnaise and salad dressings. Avocado. Safflower, olive, or canola oils. Natural peanut or almond butter. The items listed above may not be a complete list of recommended foods or beverages. Contact your dietitian for more options.  What foods are not recommended? Grains  White bread. White pasta. White rice. Cornbread. Bagels, pastries, and croissants. Crackers that contain trans fat. Vegetables  White potatoes. Corn. Creamed or fried vegetables. Vegetables in a cheese sauce. Fruits  Dried fruits. Canned fruit in light or heavy syrup. Fruit juice. Meat and Other Protein Products  Fatty cuts of meat. Ribs, chicken wings, bacon, sausage, bologna, salami, chitterlings, fatback, hot dogs, bratwurst, and packaged luncheon meats. Dairy  Whole or 2% milk, cream, half-and-half, and cream cheese. Whole-fat or sweetened yogurt. Full-fat cheeses. Nondairy creamers and whipped toppings. Processed cheese, cheese spreads, or cheese curds. Sweets and Desserts  Corn syrup, sugars, honey, and molasses. Candy. Jam and jelly. Syrup. Sweetened cereals. Cookies, pies, cakes, donuts, muffins, and ice cream. Fats and Oils  Butter, stick margarine, lard, shortening, ghee, or bacon fat. Coconut, palm kernel, or palm oils. Beverages  Alcohol. Sweetened drinks (such as sodas, lemonade, and fruit drinks or punches). The items listed above may not be a complete list of foods and beverages to avoid. Contact your dietitian for more information.  This information is not intended to replace advice given to you by your health care provider. Make sure you discuss any questions you have with your health care provider. Document   Released: 11/14/2003 Document Revised: 07/04/2015 Document Reviewed: 11/30/2012 Elsevier Interactive Patient Education  2017 Elsevier Inc.  Vitamin D Deficiency Introduction Vitamin D deficiency is when  your body does not have enough vitamin D. Vitamin D is important because:  It helps your body use other minerals that your body needs.  It helps keep your bones strong and healthy.  It may help to prevent some diseases.  It helps your heart and other muscles work well. You can get vitamin D by:  Eating foods with vitamin D in them.  Drinking or eating milk or other foods that have had vitamin D added to them.  Taking a vitamin D supplement.  Being in the sun. Not getting enough vitamin D can make your bones become soft. It can also cause other health problems. Follow these instructions at home:  Take medicines and supplements only as told by your doctor.  Eat foods that have vitamin D. These include:  Dairy products, cereals, or juices with added vitamin D. Check the label for vitamin D.  Fatty fish like salmon or trout.  Eggs.  Oysters.  Do not use tanning beds.  Stay at a healthy weight. Lose weight, if needed.  Keep all follow-up visits as told by your doctor. This is important. Contact a doctor if:  Your symptoms do not go away.  You feel sick to your stomach (nauseous).  Youthrow up (vomit).  You poop less often than usual or you have trouble pooping (constipation). This information is not intended to replace advice given to you by your health care provider. Make sure you discuss any questions you have with your health care provider. Document Released: 01/15/2011 Document Revised: 07/04/2015 Document Reviewed: 06/13/2014  2017 Elsevier Low-Fat Diet for Pancreatitis or Gallbladder Conditions A low-fat diet can be helpful if you have pancreatitis or a gallbladder condition. With these conditions, your pancreas and gallbladder have trouble digesting fats. A healthy eating plan with less fat will help rest your pancreas and gallbladder and reduce your symptoms. What do I need to know about this diet?  Eat a low-fat diet.  Reduce your fat intake to less than  20-30% of your total daily calories. This is less than 50-60 g of fat per day.  Remember that you need some fat in your diet. Ask your dietician what your daily goal should be.  Choose nonfat and low-fat healthy foods. Look for the words "nonfat," "low fat," or "fat free."  As a guide, look on the label and choose foods with less than 3 g of fat per serving. Eat only one serving.  Avoid alcohol.  Do not smoke. If you need help quitting, talk with your health care provider.  Eat small frequent meals instead of three large heavy meals. What foods can I eat? Grains  Include healthy grains and starches such as potatoes, wheat bread, fiber-rich cereal, and brown rice. Choose whole grain options whenever possible. In adults, whole grains should account for 45-65% of your daily calories. Fruits and Vegetables  Eat plenty of fruits and vegetables. Fresh fruits and vegetables add fiber to your diet. Meats and Other Protein Sources  Eat lean meat such as chicken and pork. Trim any fat off of meat before cooking it. Eggs, fish, and beans are other sources of protein. In adults, these foods should account for 10-35% of your daily calories. Dairy  Choose low-fat milk and dairy options. Dairy includes fat and protein, as well as calcium. Fats and Oils  Limit high-fat  foods such as fried foods, sweets, baked goods, sugary drinks. Other  Creamy sauces and condiments, such as mayonnaise, can add extra fat. Think about whether or not you need to use them, or use smaller amounts or low fat options. What foods are not recommended?  High fat foods, such as:  [employer] CorporationBaked goods.  Ice cream.  [place] toast.  Sweet rolls.  Pizza.  Cheese bread.  Foods covered with batter, butter, creamy sauces, or cheese.  Fried foods.  Sugary drinks and desserts.  Foods that cause gas or bloating This information is not intended to replace advice given to you by your health care provider. Make sure you discuss any  questions you have with your health care provider. Document Released: 01/31/2013 Document Revised: 07/04/2015 Document Reviewed: 01/09/2013 Elsevier Interactive Patient Education  2017 [employer]

## 2016-02-22 LAB — VITAMIN D 25 HYDROXY (VIT D DEFICIENCY, FRACTURES): Vit D, 25-Hydroxy: 31 ng/mL (ref 30–100)

## 2016-02-22 LAB — HIV ANTIBODY (ROUTINE TESTING W REFLEX): HIV: NONREACTIVE

## 2016-02-24 ENCOUNTER — Other Ambulatory Visit: Payer: Self-pay | Admitting: Family Medicine

## 2016-02-24 ENCOUNTER — Telehealth: Payer: Self-pay

## 2016-02-24 NOTE — Telephone Encounter (Signed)
Called and spoke with patient, advised of elevated triglycerides and to continue current dose of tricor. Advised patient to work on diet and exercise eating a low fat/low cholesterol diet over 5 to 6 small meals daily. Advised to drink 5 to 6 small meals daily and exercise 150 minutes weekly of cardio exercise. Encouraged patient to keep next follow up appointment. Thanks!

## 2016-02-24 NOTE — Telephone Encounter (Signed)
-----   Message from Massie MaroonLachina M Hollis, [place]NP sent at 02/24/2016  9:22 AM EST ----- Regarding: lab results Please inform Pamela [name] that triglycerides continue to be elevated. Will continue Tricor at current dosage.Remind patient to take medication consistently. Recommend a lowfat, low carbohydrate diet divided over 5-6 small meals, increase water intake to 6-8 glasses, and 150 minutes per week of cardiovascular exercise.  ----- Message ----- From: Loney HeringLaura E Batten, LPN Sent: 1/61/09601/01/2017   9:12 AM To: Massie MaroonLachina M Hollis, FNP

## 2016-03-09 ENCOUNTER — Other Ambulatory Visit (HOSPITAL_COMMUNITY): Payer: Self-pay | Admitting: Orthopedic Surgery

## 2016-03-09 DIAGNOSIS — M25561 Pain in right knee: Secondary | ICD-10-CM

## 2016-03-20 ENCOUNTER — Encounter (HOSPITAL_COMMUNITY)
Admission: RE | Admit: 2016-03-20 | Discharge: 2016-03-20 | Disposition: A | Discharge status: Home / Self Care | Payer: BLUE CROSS/BLUE SHIELD | Source: Ambulatory Visit | Attending: Orthopedic Surgery | Admitting: Orthopedic Surgery

## 2016-03-20 DIAGNOSIS — M25561 Pain in right knee: Secondary | ICD-10-CM | POA: Diagnosis not present

## 2016-03-20 MED ORDER — TECHNETIUM TC 99M MEDRONATE IV KIT
25.0000 | PACK | Freq: Once | INTRAVENOUS | Status: AC | PRN
  Administered 2016-03-20: 25 via INTRAVENOUS

## 2016-03-30 ENCOUNTER — Emergency Department (HOSPITAL_COMMUNITY): Payer: BLUE CROSS/BLUE SHIELD

## 2016-03-30 ENCOUNTER — Emergency Department (HOSPITAL_COMMUNITY)
Admission: EM | Admit: 2016-03-30 | Discharge: 2016-03-30 | Disposition: A | Discharge status: Home / Self Care | Payer: BLUE CROSS/BLUE SHIELD | Attending: Emergency Medicine | Admitting: Emergency Medicine

## 2016-03-30 ENCOUNTER — Encounter (HOSPITAL_COMMUNITY): Payer: Self-pay

## 2016-03-30 DIAGNOSIS — R072 Precordial pain: Secondary | ICD-10-CM

## 2016-03-30 DIAGNOSIS — Z7982 Long term (current) use of aspirin: Secondary | ICD-10-CM | POA: Insufficient documentation

## 2016-03-30 DIAGNOSIS — I10 Essential (primary) hypertension: Secondary | ICD-10-CM | POA: Diagnosis not present

## 2016-03-30 DIAGNOSIS — Z79899 Other long term (current) drug therapy: Secondary | ICD-10-CM | POA: Diagnosis not present

## 2016-03-30 DIAGNOSIS — R079 Chest pain, unspecified: Secondary | ICD-10-CM | POA: Diagnosis present

## 2016-03-30 LAB — CBC
HEMATOCRIT: 36 % (ref 36.0–46.0)
Hemoglobin: 11.6 g/dL — ABNORMAL LOW (ref 12.0–15.0)
MCH: 26.4 pg (ref 26.0–34.0)
MCHC: 32.2 g/dL (ref 30.0–36.0)
MCV: 81.8 fL (ref 78.0–100.0)
PLATELETS: 153 10*3/uL (ref 150–400)
RBC: 4.4 MIL/uL (ref 3.87–5.11)
RDW: 13.4 % (ref 11.5–15.5)
WBC: 4.5 10*3/uL (ref 4.0–10.5)

## 2016-03-30 LAB — I-STAT TROPONIN, ED
TROPONIN I, POC: 0 ng/mL (ref 0.00–0.08)
Troponin i, poc: 0 ng/mL (ref 0.00–0.08)

## 2016-03-30 LAB — BASIC METABOLIC PANEL
Anion gap: 9 (ref 5–15)
BUN: 18 mg/dL (ref 6–20)
CHLORIDE: 108 mmol/L (ref 101–111)
CO2: 23 mmol/L (ref 22–32)
CREATININE: 0.69 mg/dL (ref 0.44–1.00)
Calcium: 9.7 mg/dL (ref 8.9–10.3)
GFR calc Af Amer: >60 mL/min (ref >60)
GFR calc non Af Amer: >60 mL/min (ref >60)
GLUCOSE: 96 mg/dL (ref 65–99)
POTASSIUM: 4.2 mmol/L (ref 3.5–5.1)
Sodium: 140 mmol/L (ref 135–145)

## 2016-03-30 MED ORDER — MORPHINE SULFATE (PF) 4 MG/ML IV SOLN
4.0000 mg | Freq: Once | INTRAVENOUS | Status: AC
  Administered 2016-03-30: 4 mg via INTRAVENOUS
  Filled 2016-03-30: qty 1

## 2016-03-30 MED ORDER — ONDANSETRON HCL 4 MG/2ML IJ SOLN
4.0000 mg | Freq: Once | INTRAMUSCULAR | Status: AC
Start: 2016-03-30 — End: 2016-03-30
  Administered 2016-03-30: 4 mg via INTRAVENOUS
  Filled 2016-03-30: qty 2

## 2016-03-30 NOTE — ED Notes (Signed)
PT a&ox4 ambulatory at d/c with steady gait, NAD

## 2016-03-30 NOTE — ED Provider Notes (Signed)
Pt seen and evaluated.  D/w Dr. Alanda SlimGonfa. Pt with Lt CP. 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 Falls with Dr.Kadakia. Heart score of 3 based on history (1), and risks (2). We'll plan serial enzymes in ER. Await receiving results of stress echo from cardiology office. Will reassess.   Rolland PorterMark Lynnita Somma, MD 03/30/16 1101

## 2016-03-30 NOTE — ED Notes (Signed)
Patient transported to X-ray 

## 2016-03-30 NOTE — ED Provider Notes (Signed)
[facility]ENCY DEPT Provider Note   CSN: [id number] Arrival date & time: 03/30/16  0930     History   Chief Complaint Chief Complaint  Patient presents with  . Chest Pain    HPI Pamela [name] is a 46 y.o. female.  HPI Pamela [name] is A 46 year old female with history of hypertension and hyperlipidemia who presents with chest tightness since this morning about 7:30 AM. Patient reports feeling tightness and sharp pain in her left chest when she was getting out of a car this morning. At the same time she also felt lightheaded and nauseous. She says she couldn't catch her breath. About 30 minutes later at work she again felt chest tightness while she was at work. Chest pain was not exertional. She reports diaphoresis but states feeling cold. She denies aggravating or elevating factor to her chest pain. Chest pain radiates to her left shoulder and left neck.  Off note, she reports follow-up with cardiology, Dr. Algie CofferKadakia for her bradycardia. She states Dr. Algie CofferKadakia discussed about pacemaker placement for bradycardia and ectopic pacemaker. She reports starting medication for hypertension but don't remember the name of the medicine. She reports having a resting and stress echocardiogram but not quite sure about the results.  Patient denies headache, vision changes, focal weakness or numbness, cold symptoms, fever, emesis, diarrhea, pain or swelling in her legs except over her right knee.  Patient reports maternal history of heart attack at age 45 and brother with cardiac arrest at age 45 and sister with open-heart surgery for valve replacement and aneurysm. She denies personal or family history of blood clot DVT.  Patient is on multiple vitamin supplements and Maxalt for her migraine headache.   Past Medical History:  Diagnosis Date  . Chest pain   . Degenerative joint disease   . Herpes simplex   . Hx of echocardiogram 2012   normal  . Hypertension   . Migraine   . Migraine without  aura, with intractable migraine, so stated, without mention of status migrainosus 07/26/2013  . Normal cardiac stress test 2012  . Obesity   . Osteoarthritis of knee     Patient Active Problem List   Diagnosis Date Noted  . Essential hypertension 02/21/2016  . Bradycardia 02/21/2016  . Hypertriglyceridemia 05/07/2015  . Murmur 11/10/2013  . Congenital heart disease 11/10/2013  . Migraine without aura, with intractable migraine, so stated, without mention of status migrainosus 07/26/2013  . Pap smear for cervical cancer screening 09/29/2012  . Morbid obesity (HCC) 09/29/2012  . Polyphagia(783.6) 09/29/2012  . Chest pain 09/03/2012    Past Surgical History:  Procedure Laterality Date  . ASD REPAIR  1977  . CARDIOVASCULAR STRESS TEST     negative  . CARPAL TUNNEL RELEASE    . CESAREAN SECTION    . EXPLORATION POST OPERATIVE OPEN HEART    . KNEE SURGERY  2012   right    OB History    No data available       Home Medications    Prior to Admission medications   Medication Sig Start Date End Date Taking? Authorizing Provider  Ascorbic Acid (VITAMIN C PO) Take 1,000 mg by mouth daily.    Yes Historical Provider, MD  aspirin EC 81 MG tablet Take 81 mg by mouth daily.   Yes Historical Provider, MD  cetirizine (ZYRTEC) 10 MG tablet Take 1 tablet (10 mg total) by mouth daily. One tab daily for allergies Patient taking differently: Take 10 mg by mouth  at bedtime. One tab daily for allergies 04/23/13  Yes Linna Hoff, MD  Cholecalciferol (VITAMIN D3) 5000 UNITS TABS Take 5,000 Units by mouth daily.    Yes Historical Provider, MD  Cyanocobalamin (VITAMIN B 12 PO) Take 1 tablet by mouth daily.   Yes Historical Provider, MD  Echinacea 80 MG CAPS Take by mouth.   Yes Historical Provider, MD  fenofibrate (TRICOR) 145 MG tablet Take 1 tablet (145 mg total) by mouth daily. 08/10/15  Yes Massie Maroon, FNP  fluticasone (FLONASE) 50 MCG/ACT nasal spray Place 2 sprays into both nostrils at  bedtime. 05/06/15  Yes Massie Maroon, FNP  ibuprofen (ADVIL,MOTRIN) 600 MG tablet Take 1 tablet (600 mg total) by mouth every 6 (six) hours as needed for moderate pain. 06/26/15  Yes Charm Rings, MD  lisinopril (PRINIVIL,ZESTRIL) 10 MG tablet Take 10 mg by mouth daily. 03/12/16  Yes Historical Provider, MD  Magnesium 500 MG TABS Take 2,500 mg by mouth daily.   Yes Historical Provider, MD  Omega-3 Fatty Acids (FISH OIL) 1200 MG CPDR Take 1,200 mg by mouth at bedtime.    Yes Historical Provider, MD  Phenylephrine-DM-GG-APAP (MUCINEX FAST-MAX COLD FLU PO) Take 1 tablet by mouth daily as needed (for cold and flu symptoms). Reported on 05/06/2015   Yes Historical Provider, MD  rizatriptan (MAXALT-MLT) 10 MG disintegrating tablet Take 1 tablet (10 mg total) by mouth 3 (three) times daily as needed for migraine. May repeat in 2 hours if needed 09/27/15  Yes Henrietta Hoover, NP  vitamin A 8000 UNIT capsule Take 8,000 Units by mouth at bedtime.    Yes Historical Provider, MD  vitamin E 400 UNIT capsule Take 400 Units by mouth at bedtime.    Yes Historical Provider, MD  albuterol (PROVENTIL HFA;VENTOLIN HFA) 108 (90 Base) MCG/ACT inhaler Inhale 2 puffs into the lungs every 2 (two) hours as needed for wheezing or shortness of breath (cough). Patient not taking: Reported on 03/30/2016 04/04/15   Rhona Raider, PA-C    Family History Family History  Problem Relation Age of Onset  . Coronary artery disease Mother 32  . Stroke Mother   . Migraines Mother   . Migraines Sister   . Other       There is also history of diabetes, stroke, and cancer in the family    Social History Social History  Substance Use Topics  . Smoking status: Never Smoker  . Smokeless tobacco: Never Used  . Alcohol use No     Comment: occasional     Allergies   Amoxicillin; Bee venom; Imitrex [sumatriptan]; Tramadol; and Vicodin [hydrocodone-acetaminophen]   Review of Systems Review of Systems  Constitutional: Negative  for chills and fever.  HENT: Negative for ear pain and sore throat.   Eyes: Negative for pain and visual disturbance.  Respiratory: Positive for chest tightness. Negative for cough and shortness of breath.   Cardiovascular: Negative for chest pain and palpitations.  Gastrointestinal: Positive for nausea. Negative for abdominal pain and vomiting.  Genitourinary: Negative for dysuria and hematuria.  Musculoskeletal: Negative for back pain and myalgias.  Skin: Negative for color change and rash.  Neurological: Positive for light-headedness. Negative for seizures, syncope, numbness and headaches.  All other systems reviewed and are negative.   Physical Exam Updated Vital Signs BP 143/74 (BP Location: Right Arm)   Pulse (!) 46   Temp 98.6 F (37 C) (Oral)   Resp 16   Ht 5\' 3"  (1.6 m)  Wt 104.3 kg   SpO2 100%   BMI 40.74 kg/m   Physical Exam GEN: appears well, no apparent distress. Head: normocephalic and atraumatic  Eyes: conjunctiva without injection, sclera anicteric Oropharynx: mmm without erythema or exudation HEM: negative for cervical or periauricular lymphadenopathies CVS: RRR, nl s1 & s2, no murmurs, no edema,  2+ DP & PT pulses bilaterally, cap refills < 2 secs RESP: speaks in full sentence, no IWOB, good air movement bilaterally, CTAB GI: BS present & normal, soft, NTND, no guarding, no rebound, no mass GU: no suprapubic or CVA tenderness MSK: tender to palpation over her left chest anterolaterally. Pain is not triggered by movement. SKIN: no apparent skin lesion NEURO: alert and oiented appropriately, no gross defecits  PSYCH: euthymic mood with congruent affect  ED Treatments / Results  Labs (all labs ordered are listed, but only abnormal results are displayed) Labs Reviewed  BASIC METABOLIC PANEL  CBC  I-STAT TROPOININ, ED    EKG  EKG Interpretation  Date/Time:  Monday March 30 2016 09:39:30 EST Ventricular Rate:  47 PR Interval:  160 QRS  Duration: 98 QT Interval:  416 QTC Calculation: 368 R Axis:   62 Text Interpretation:  Unusual P axis, possible ectopic atrial bradycardia Cannot rule out Anterior infarct , age undetermined Abnormal ECG Confirmed by Fayrene Fearing  MD, MARK ([id number]) on 03/30/2016 10:20:28 AM       Radiology No results found.  Procedures Procedures (including critical care time)  Medications Ordered in ED Medications - No data to display   Initial Impression / Assessment and Plan / ED Course  I have reviewed the triage vital signs and the nursing notes.  Pertinent labs & imaging results that were available during my care of the patient were reviewed by me and considered in my medical decision making (see chart for details).  Patient is a 46 year old female with history of bradycardia and ? Ectopic pacemaker who presents with chest pain. Chest pain is nonexertional but associated nausea and lightheadedness. EKG in ED with a sinus bradycardia with possible ectopic pacemaker change from baseline. Troponin negative 2. There is some muscular skeletal component as chest pain is reproducible. A bradycardia could have some contribution to her symptoms as well.  PEs is very unlikely with PERC score of 0. She has no signs or symptoms to suggest infectious etiology.  Called her cardiologist office, Dr. Algie Coffer. Unfortunately Dr. Algie Coffer is out of the [place]. His office was not able to fax [place] about her medications and echo results as they fax machine is broken. His nurse was not able to see the result of her echocardiogram either.   Patient was given Zofran and morphine here with the resolution of her symptoms. ACS unlikely based on her EKG and negative troponin x2. I think she is stable enough to be discharged and follow up with the cardiologist. She already have an upcoming appointment in about a week. Patient is in agreement with the plan.  I recommended trying heat or ice for pain. I also recommended stopping the  over-the-counter vitamins unless she was told to take by her doctor.  Final Clinical Impressions(s) / ED Diagnoses   Final diagnoses:  None    New Prescriptions New Prescriptions   No medications on file     Almon Hercules, MD 03/30/16 1538    Rolland Porter, MD 04/01/16 1556

## 2016-03-30 NOTE — ED Triage Notes (Signed)
Pt presents for evaluation of L sided CP with radiation to L arm and neck this AM. Pt reports pain was squeezing and shooting nature. Reports some dizziness/nausea at time of event. Pt AxO x4. Reports congenital heart condition requiring surgery at age 89.

## 2016-03-30 NOTE — ED Notes (Signed)
Hooked patient to the monitor patient is resting 

## 2016-03-30 NOTE — ED Notes (Signed)
ED Provider at bedside. 

## 2016-03-30 NOTE — ED Notes (Signed)
Care handoff to Melanie, RN 

## 2016-03-30 NOTE — Discharge Instructions (Signed)
It is nice taking care of you! After the tests we have done, it is very unlikely that your chest pain is coming from the heart. You may try heat or ice as needed for pain.  Please seek immediate care if you have worsening of the chest pain, trouble breathing or other symptoms concerning to you. Please follow up with the cardiologist as soon as possible.

## 2016-08-05 ENCOUNTER — Telehealth: Payer: Self-pay

## 2016-08-05 ENCOUNTER — Other Ambulatory Visit: Payer: Self-pay | Admitting: Family Medicine

## 2016-08-06 ENCOUNTER — Ambulatory Visit (HOSPITAL_COMMUNITY)
Admission: RE | Admit: 2016-08-06 | Discharge: 2016-08-06 | Disposition: A | Discharge status: Home / Self Care | Payer: BLUE CROSS/BLUE SHIELD | Source: Ambulatory Visit | Attending: Family Medicine | Admitting: Family Medicine

## 2016-08-06 ENCOUNTER — Ambulatory Visit (INDEPENDENT_AMBULATORY_CARE_PROVIDER_SITE_OTHER): Payer: BLUE CROSS/BLUE SHIELD | Admitting: Family Medicine

## 2016-08-06 ENCOUNTER — Encounter: Payer: Self-pay | Admitting: Family Medicine

## 2016-08-06 VITALS — BP 133/62 | HR 81 | Ht 63.0 in | Wt 227.0 lb

## 2016-08-06 DIAGNOSIS — E785 Hyperlipidemia, unspecified: Secondary | ICD-10-CM

## 2016-08-06 DIAGNOSIS — I10 Essential (primary) hypertension: Secondary | ICD-10-CM | POA: Diagnosis not present

## 2016-08-06 DIAGNOSIS — M549 Dorsalgia, unspecified: Secondary | ICD-10-CM

## 2016-08-06 DIAGNOSIS — M5430 Sciatica, unspecified side: Secondary | ICD-10-CM | POA: Diagnosis not present

## 2016-08-06 DIAGNOSIS — Z131 Encounter for screening for diabetes mellitus: Secondary | ICD-10-CM | POA: Diagnosis not present

## 2016-08-06 DIAGNOSIS — I517 Cardiomegaly: Secondary | ICD-10-CM | POA: Diagnosis not present

## 2016-08-06 DIAGNOSIS — B009 Herpesviral infection, unspecified: Secondary | ICD-10-CM | POA: Diagnosis not present

## 2016-08-06 DIAGNOSIS — M5136 Other intervertebral disc degeneration, lumbar region: Secondary | ICD-10-CM | POA: Insufficient documentation

## 2016-08-06 MED ORDER — ACYCLOVIR 5 % EX OINT: 1.0000 "application " | TOPICAL_OINTMENT | CUTANEOUS | 1 refills | Status: DC

## 2016-08-06 MED ORDER — VALACYCLOVIR HCL 500 MG PO TABS: 500.0000 mg | ORAL_TABLET | Freq: Two times a day (BID) | ORAL | 1 refills | Status: DC

## 2016-08-06 MED ORDER — RIZATRIPTAN BENZOATE 10 MG PO TBDP: 10.0000 mg | ORAL_TABLET | Freq: Three times a day (TID) | ORAL | 3 refills | Status: DC | PRN

## 2016-08-06 MED ORDER — LIDOCAINE 5 % EX PTCH: 1.0000 | MEDICATED_PATCH | CUTANEOUS | 0 refills | Status: DC

## 2016-08-06 NOTE — Patient Instructions (Addendum)
Christy schedule a visit to return in 4 weeks for fasting labs.  [facility]. You will be notified of any abnormal  results. I have prescribed lidocaine patches to relieve her pain.  All requested medications have been sent over to your pharmacy.  You can schedule a return follow-up in 6 months for a routine wellness visit.     Heart-Healthy Eating Plan Heart-healthy meal planning includes:  Limiting unhealthy fats.  Increasing healthy fats.  Making other small dietary changes.  You may need to talk with your doctor or a diet specialist (dietitian) to create an eating plan that is right for you. What types of fat should I choose?  Choose healthy fats. These include olive oil and canola oil, flaxseeds, walnuts, almonds, and seeds.  Eat more omega-3 fats. These include salmon, mackerel, sardines, tuna, flaxseed oil, and ground flaxseeds. Try to eat fish at least twice each week.  Limit saturated fats. ? Saturated fats are often found in animal products, such as meats, butter, and cream. ? Plant sources of saturated fats include palm oil, palm kernel oil, and coconut oil.  Avoid foods with partially hydrogenated oils in them. These include stick margarine, some tub margarines, cookies, crackers, and other baked goods. These contain trans fats. What general guidelines do I need to follow?  Check food labels carefully. Identify foods with trans fats or high amounts of saturated fat.  Fill one half of your plate with vegetables and green salads. Eat 4-5 servings of vegetables per day. A serving of vegetables is: ? 1 cup of raw leafy vegetables. ?  cup of raw or cooked cut-up vegetables. ?  cup of vegetable juice.  Fill one fourth of your plate with whole grains. Look for the word "whole" as the first word in the ingredient list.  Fill one fourth of your plate with lean protein foods.  Eat 4-5 servings of fruit  per day. A serving of fruit is: ? One medium whole fruit. ?  cup of dried fruit. ?  cup of fresh, frozen, or canned fruit. ?  cup of 100% fruit juice.  Eat more foods that contain soluble fiber. These include apples, broccoli, carrots, beans, peas, and barley. Try to get 20-30 g of fiber per day.  Eat more home-cooked food. Eat less restaurant, buffet, and fast food.  Limit or avoid alcohol.  Limit foods high in starch and sugar.  Avoid fried foods.  Avoid frying your food. Try baking, boiling, grilling, or broiling it instead. You can also reduce fat by: ? Removing the skin from poultry. ? Removing all visible fats from meats. ? Skimming the fat off of stews, soups, and gravies before serving them. ? Steaming vegetables in water or broth.  Lose weight if you are overweight.  Eat 4-5 servings of nuts, legumes, and seeds per week: ? One serving of dried beans or legumes equals  cup after being cooked. ? One serving of nuts equals 1 ounces. ? One serving of seeds equals  ounce or one tablespoon.  You may need to keep track of how much salt or sodium you eat. This is especially true if you have high blood pressure. Talk with your doctor or dietitian to get more information. What foods can I eat? Grains Breads, including [place], white, pita, wheat, raisin, rye, oatmeal, and [place]. Tortillas that are neither fried nor made with lard or trans fat. Low-fat rolls, including hotdog and hamburger  buns and English muffins. Biscuits. Muffins. Waffles. Pancakes. Light popcorn. Whole-grain cereals. Flatbread. Melba toast. Pretzels. Breadsticks. Rusks. Low-fat snacks. Low-fat crackers, including oyster, saltine, matzo, graham, animal, and rye. Rice and pasta, including brown rice and pastas that are made with whole wheat. Vegetables All vegetables. Fruits All fruits, but limit coconut. Meats and Other Protein Sources Lean, well-trimmed beef, veal, pork, and lamb. Chicken and [place]  without skin. All fish and shellfish. Wild duck, rabbit, pheasant, and venison. Egg whites or low-cholesterol egg substitutes. Dried beans, peas, lentils, and tofu. Seeds and most nuts. Dairy Low-fat or nonfat cheeses, including ricotta, string, and mozzarella. Skim or 1% milk that is liquid, powdered, or evaporated. Buttermilk that is made with low-fat milk. Nonfat or low-fat yogurt. Beverages Mineral water. Diet carbonated beverages. Sweets and Desserts Sherbets and fruit ices. Honey, jam, marmalade, jelly, and syrups. Meringues and gelatins. Pure sugar candy, such as hard candy, jelly beans, gumdrops, mints, marshmallows, and small amounts of dark chocolate. [employer]. Eat all sweets and desserts in moderation. Fats and Oils Nonhydrogenated (trans-free) margarines. Vegetable oils, including soybean, sesame, sunflower, olive, peanut, safflower, corn, canola, and cottonseed. Salad dressings or mayonnaise made with a vegetable oil. Limit added fats and oils that you use for cooking, baking, salads, and as spreads. Other Cocoa powder. Coffee and tea. All seasonings and condiments. The items listed above may not be a complete list of recommended foods or beverages. Contact your dietitian for more options. What foods are not recommended? Grains Breads that are made with saturated or trans fats, oils, or whole milk. Croissants. Butter rolls. Cheese breads. Sweet rolls. Donuts. Buttered popcorn. Chow mein noodles. High-fat crackers, such as cheese or butter crackers. Meats and Other Protein Sources Fatty meats, such as hotdogs, short ribs, sausage, spareribs, bacon, rib eye roast or steak, and mutton. High-fat deli meats, such as salami and bologna. Caviar. Domestic duck and goose. Organ meats, such as kidney, liver, sweetbreads, and heart. Dairy Cream, sour cream, cream cheese, and creamed cottage cheese. Whole-milk cheeses, including blue (bleu), [address], [place], [place], [address], [place],  [address], cheddar, [place], and [place]. Whole or 2% milk that is liquid, evaporated, or condensed. Whole buttermilk. Cream sauce or high-fat cheese sauce. Yogurt that is made from whole milk. Beverages Regular sodas and juice drinks with added sugar. Sweets and Desserts Frosting. Pudding. Cookies. Cakes other than angel food cake. Candy that has milk chocolate or white chocolate, hydrogenated fat, butter, coconut, or unknown ingredients. Buttered syrups. Full-fat ice cream or ice cream drinks. Fats and Oils Gravy that has suet, meat fat, or shortening. Cocoa butter, hydrogenated oils, palm oil, coconut oil, palm kernel oil. These can often be found in baked products, candy, fried foods, nondairy creamers, and whipped toppings. Solid fats and shortenings, including bacon fat, salt pork, lard, and butter. Nondairy cream substitutes, such as coffee creamers and sour cream substitutes. Salad dressings that are made of unknown oils, cheese, or sour cream. The items listed above may not be a complete list of foods and beverages to avoid. Contact your dietitian for more information. This information is not intended to replace advice given to you by your health care provider. Make sure you discuss any questions you have with your health care provider. Document Released: 07/28/2011 Document Revised: 07/04/2015 Document Reviewed: 07/20/2013 Elsevier Interactive Patient Education  [employer].

## 2016-08-06 NOTE — Progress Notes (Signed)
Patient ID: Pamela [name], female    DOB: 07-22-1970, 46 y.o.   MRN: [id number]  PCP: Massie Maroon, FNP  Chief Complaint  Patient presents with  . Follow-up     6 month  . Medication Management    for herpes virus    Subjective:  HPI Pamela [name] is a 46 y.o. female presents for a routine six month evaluation.  Medical problems include: Migraines, HSV-1&2, Hypertension, congenital heart disease.  Pamela [name] reports today that overall her health has been fairly stable until recently. Her son suffered from a gunshot recently which has caused severe stress. Her son is recovering well. She recently experienced a fever blister on her lip and took her last dose of antiviral yesterday. She was diagnosed with HSV 1 and 2 several years ago during pregnancy.  Congenital heart disease/murmur Pamela [name] reports that she last saw cardiology back in 2015 evaluated for shortness of breath and multiple episodes of chest wall pain. During that visit with cardiology echocardiogram, however in the system that shows that was never performed. Patient had undergone a myocardial stress test back in 2012 confirmed a diagnosis of mild left ventricular hypertrophy with a preserved EF 59% with normal systolic function. At present, she is prescribed hydralazine 25 mg 2 times daily for hypertension and she takes fish oil for hyperlipidemia. She has had no shows at her last scheduled cardiology appointment's which was about 2 years ago. At present she complains of no chest pain, no shortness of breath, and no dizziness.   Back pain from recent fall  Pamela [name] reports that she was stepping off of her porch and fell backwards a few weeks ago. She immediately developed lower back back pain which she thought would improve with time, heat, and conservative measures. Pamela [name] reports worsening back pain  with associated sharp burning pain radiating into her bilateral pain. Pain is most pronounced with sitting and standing.She  experiences some relief with lying down.  Social History   Social History  . Marital status: Legally Separated    Spouse name: N/A  . Number of children: 4  . Years of education: college AS   Occupational History  . Teacher [employer]  .  [employer]   Social History Main Topics  . Smoking status: Never Smoker  . Smokeless tobacco: Never Used  . Alcohol use No     Comment: occasional  . Drug use: No  . Sexual activity: Yes    Birth control/ protection: None   Other Topics Concern  . Not on file   Social History Narrative  . No narrative on file    Family History  Problem Relation Age of Onset  . Coronary artery disease Mother 26  . Stroke Mother   . Migraines Mother   . Migraines Sister   . Other Unknown         There is also history of diabetes, stroke, and cancer in the family   Review of Systems See HPI   Patient Active Problem List   Diagnosis Date Noted  . Essential hypertension 02/21/2016  . Bradycardia 02/21/2016  . Hypertriglyceridemia 05/07/2015  . Murmur 11/10/2013  . Congenital heart disease 11/10/2013  . Migraine without aura, with intractable migraine, so stated, without mention of status migrainosus 07/26/2013  . Pap smear for cervical cancer screening 09/29/2012  . Morbid obesity (HCC) 09/29/2012  . Polyphagia(783.6) 09/29/2012  . Chest pain 09/03/2012    Allergies  Allergen Reactions  .  Amoxicillin Nausea Only  . Bee Venom Swelling  . Imitrex [Sumatriptan] Other (See Comments)    Gives chest pains  . Tramadol     Migraine   . Vicodin [Hydrocodone-Acetaminophen] Other (See Comments)    Migraines    Prior to Admission medications   Medication Sig Start Date End Date Taking? Authorizing Provider  Ascorbic Acid (VITAMIN C PO) Take 1,000 mg by mouth daily.    Yes [provider]  cetirizine (ZYRTEC) 10 MG tablet Take 1 tablet (10 mg total) by mouth daily. One tab daily for allergies Patient  taking differently: Take 10 mg by mouth at bedtime. One tab daily for allergies 04/23/13  Yes Kindl, Quita SkyeJames D, MD  Cholecalciferol (VITAMIN D3) 5000 UNITS TABS Take 5,000 Units by mouth daily.    Yes [provider]  Cyanocobalamin (VITAMIN B 12 PO) Take 1 tablet by mouth daily.   Yes [provider]  Echinacea 80 MG CAPS Take by mouth.   Yes [provider]  hydrALAZINE (APRESOLINE) 25 MG tablet Take 25 mg by mouth 2 (two) times daily.   Yes Orpah CobbKadakia, Ajay, MD  ibuprofen (ADVIL,MOTRIN) 600 MG tablet Take 1 tablet (600 mg total) by mouth every 6 (six) hours as needed for moderate pain. 06/26/15  Yes Charm RingsHonig, Erin J, MD  Magnesium 500 MG TABS Take 2,500 mg by mouth daily.   Yes [provider]  meloxicam (MOBIC) 15 MG tablet Take 15 mg by mouth daily.   Yes Gean Birchwoodowan, Frank, MD  Omega-3 Fatty Acids (FISH OIL) 1200 MG CPDR Take 1,200 mg by mouth at bedtime.    Yes [provider]    Past Medical, Surgical Family and Social History reviewed and updated.    Objective:   Today's Vitals   08/06/16 1042  BP: 133/62  Pulse: 81  SpO2: 98%  Weight: 227 lb (103 kg)  Height: 5\' 3"  (1.6 m)    Wt Readings from Last 3 Encounters:  08/06/16 227 lb (103 kg)  03/30/16 230 lb (104.3 kg)  02/21/16 234 lb (106.1 kg)   Physical Exam  Constitutional: She is oriented to person, place, and time. She appears well-developed and well-nourished.  HENT:  Head: Normocephalic and atraumatic.  Eyes: Conjunctivae are normal. Pupils are equal, round, and reactive to light.  Neck: Normal range of motion. Neck supple. No thyromegaly present.  Cardiovascular: Normal rate, regular rhythm, normal heart sounds and intact distal pulses.   Pulmonary/Chest: Effort normal and breath sounds normal.  Musculoskeletal: She exhibits tenderness. She exhibits no edema.  The patient reports pronounced tenderness with sitting and complete weightbearing. Unable to reproduce during exam with  palpation of lumbar spine.  Lymphadenopathy:    She has no cervical adenopathy.  Neurological: She is alert and oriented to person, place, and time. Coordination normal.  Reflex Scores:      Patellar reflexes are 1+ on the right side and 1+ on the left side. Skin: Skin is warm and dry.  Psychiatric: She has a normal mood and affect. Her behavior is normal. Judgment and thought content normal.    Assessment & Plan:  1. Back pain with sciatica - DG Lumbar Spine Complete  2. HSV-2 (herpes simplex virus 2) infection 3. HSV-1 (herpes simplex virus 1) infection -Valacyclovir 500 mg 2 times daily. -Acyclovir ointment 5%, and application every 3 hours to affected lesions.  4. LVH (left ventricular hypertrophy) -Schedule follow-up with cardiology and obtain echocardiogram which was recommended back in 2015.  5. Hyperlipidemia  -  Continue fish oil 1200 mg daily at bedtime. -We'll obtain fasting labs in 4 weeks, if lipids are abnormal will consider adding a statin therapy.  6. Essential Hypertension, stable  -Continue current medication regimen   7. Screen for diabetes mellitus  -hemoglobin A1C-pending   RTC: Return in 4 weeks for fasting labs. Orders placed as future in EPIC.  Keep scheduled 6 month follow-up.  Godfrey Pick. Tiburcio Pea, MSN, FNP-C [facility]  [address] [place], [place] [address] [phone]

## 2016-08-07 ENCOUNTER — Encounter: Payer: Self-pay | Admitting: Family Medicine

## 2016-08-07 ENCOUNTER — Telehealth: Payer: Self-pay | Admitting: Family Medicine

## 2016-08-07 DIAGNOSIS — M5136 Other intervertebral disc degeneration, lumbar region: Secondary | ICD-10-CM

## 2016-08-07 MED ORDER — GABAPENTIN 300 MG PO CAPS: 300.0000 mg | ORAL_CAPSULE | Freq: Three times a day (TID) | ORAL | 1 refills | Status: DC

## 2016-08-07 NOTE — Telephone Encounter (Signed)
Please call patient to advise that I am referring her for her lower back pain and abnormal lumbar spine x-ray to a neurosurgery specialist for further evaluation. 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  Advise her that I will mail her a copy of her x-ray results and notify her once her appointment has been scheduled.   I will place a referral today for a [place] [place] [facility] [place]. For pain I am prescribing her Gabapentin 300mg  up to 3 times daily for back pain. She can also take acetaminophen 650 mg every 8 hours to prevent worsening of pain.   She should notify our office if she hasn't received an appointment with Dr. Danielle DessElsner within the next 2 weeks.  [name], MSN, FNP-C [facility]  [address] [place], [place] [address] [phone]

## 2016-08-07 NOTE — Progress Notes (Signed)
Please mail letter of lumbarspine x-ray to patient

## 2016-08-09 NOTE — Telephone Encounter (Signed)
Pamela [name] please call [name] to advise that she needs to reschedule a follow-up with cardiology Dr. Rennis GoldenHilty. After review of her EMR, she was previously scheduled for an echocardiogram in 2015 and which was never completed. If cardiology needs an updated referral, let me know and I can submit a new referral.  [name], MSN, FNP-C [name]  [address] [place], [place] [address] [phone]

## 2016-08-10 ENCOUNTER — Telehealth: Payer: Self-pay

## 2016-08-10 ENCOUNTER — Other Ambulatory Visit: Payer: Self-pay

## 2016-08-10 MED ORDER — GABAPENTIN 300 MG PO CAPS: 300.0000 mg | ORAL_CAPSULE | Freq: Three times a day (TID) | ORAL | 1 refills | Status: DC

## 2016-08-10 NOTE — Telephone Encounter (Signed)
Patient states that she goes to [facility] and that she would have them to fax over records. Patient has a appointment with them 08/10/16

## 2016-08-10 NOTE — Telephone Encounter (Signed)
Spoke with patient and told her I sent medication to the [facility] as requested. Also will do a PA on the patches

## 2016-08-10 NOTE — Telephone Encounter (Signed)
PA sent in for the Lidocaine Patch on 08/10/2016

## 2016-08-11 MED ORDER — GABAPENTIN 300 MG PO CAPS: 300.0000 mg | ORAL_CAPSULE | Freq: Four times a day (QID) | ORAL | 1 refills | Status: DC

## 2016-08-11 NOTE — Telephone Encounter (Signed)
Pharmacy states that if you change the direction or dosage of the gabapentin insurance will pay for it

## 2016-08-11 NOTE — Telephone Encounter (Signed)
Referral was sent to Dr. Barnett AbuHenry Elsner office

## 2016-08-11 NOTE — Telephone Encounter (Signed)
Updated dosage of Gabapentin to 300 mg QID

## 2016-08-11 NOTE — Addendum Note (Signed)
Addended by: Bing NeighborsHARRIS, Enzio Buchler S on: 08/11/2016 07:10 PM   Modules accepted: Orders

## 2016-08-20 ENCOUNTER — Ambulatory Visit: Payer: Self-pay | Admitting: Family Medicine

## 2016-09-04 ENCOUNTER — Other Ambulatory Visit (INDEPENDENT_AMBULATORY_CARE_PROVIDER_SITE_OTHER): Payer: BLUE CROSS/BLUE SHIELD

## 2016-09-04 DIAGNOSIS — I10 Essential (primary) hypertension: Secondary | ICD-10-CM

## 2016-09-04 DIAGNOSIS — B009 Herpesviral infection, unspecified: Secondary | ICD-10-CM

## 2016-09-04 DIAGNOSIS — Z131 Encounter for screening for diabetes mellitus: Secondary | ICD-10-CM

## 2016-09-04 DIAGNOSIS — E785 Hyperlipidemia, unspecified: Secondary | ICD-10-CM

## 2016-09-04 LAB — CBC WITH DIFFERENTIAL/PLATELET
BASOS PCT: 0 %
Basophils Absolute: 0 cells/uL (ref 0–200)
EOS ABS: 84 {cells}/uL (ref 15–500)
Eosinophils Relative: 2 %
HEMATOCRIT: 38 % (ref 35.0–45.0)
HEMOGLOBIN: 12.4 g/dL (ref 11.7–15.5)
LYMPHS ABS: 1932 {cells}/uL (ref 850–3900)
LYMPHS PCT: 46 %
MCH: 26.7 pg — ABNORMAL LOW (ref 27.0–33.0)
MCHC: 32.6 g/dL (ref 32.0–36.0)
MCV: 81.9 fL (ref 80.0–100.0)
MONO ABS: 294 {cells}/uL (ref 200–950)
MPV: 10.6 fL (ref 7.5–12.5)
Monocytes Relative: 7 %
NEUTROS PCT: 45 %
Neutro Abs: 1890 cells/uL (ref 1500–7800)
Platelets: 181 10*3/uL (ref 140–400)
RBC: 4.64 MIL/uL (ref 3.80–5.10)
RDW: 14 % (ref 11.0–15.0)
WBC: 4.2 10*3/uL (ref 3.8–10.8)

## 2016-09-05 LAB — COMPLETE METABOLIC PANEL WITH GFR
ALBUMIN: 4.4 g/dL (ref 3.6–5.1)
ALK PHOS: 81 U/L (ref 33–115)
ALT: 23 U/L (ref 6–29)
AST: 20 U/L (ref 10–30)
BUN: 17 mg/dL (ref 7–25)
CO2: 21 mmol/L (ref 20–31)
Calcium: 9.4 mg/dL (ref 8.6–10.2)
Chloride: 104 mmol/L (ref 98–110)
Creat: 0.68 mg/dL (ref 0.50–1.10)
GFR, Est African American: >89 mL/min (ref >=60)
GFR, Est Non African American: >89 mL/min (ref >=60)
Glucose, Bld: 87 mg/dL (ref 65–99)
POTASSIUM: 4.5 mmol/L (ref 3.5–5.3)
SODIUM: 139 mmol/L (ref 135–146)
TOTAL PROTEIN: 7.1 g/dL (ref 6.1–8.1)
Total Bilirubin: 0.4 mg/dL (ref 0.2–1.2)

## 2016-09-05 LAB — LIPID PANEL
CHOL/HDL RATIO: 3.6 ratio (ref <5.0)
CHOLESTEROL: 157 mg/dL (ref <200)
HDL: 44 mg/dL — AB (ref >50)
LDL Cholesterol: 61 mg/dL (ref <100)
Triglycerides: 262 mg/dL — ABNORMAL HIGH (ref <150)
VLDL: 52 mg/dL — ABNORMAL HIGH (ref <30)

## 2016-09-05 LAB — THYROID PANEL WITH TSH
Free Thyroxine Index: 1.9 (ref 1.4–3.8)
T3 Uptake: 26 % (ref 22–35)
T4, Total: 7.4 ug/dL (ref 4.5–12.0)
TSH: 1.16 m[IU]/L

## 2016-09-05 LAB — HEMOGLOBIN A1C
Hgb A1c MFr Bld: 5.2 % (ref <5.7)
Mean Plasma Glucose: 103 mg/dL

## 2016-09-23 ENCOUNTER — Telehealth: Payer: Self-pay

## 2016-09-23 NOTE — Telephone Encounter (Signed)
Patient would like to know if she needs to start back taking cholesterol medication

## 2016-09-23 NOTE — Telephone Encounter (Signed)
She should continue fish oil, exercise, and reduce intake of fatty and processed foods in order to reduce weight and improve HDL which is her good cholesterol and reduce her triglycerides. She is ok right now to remain off of statin therapy.  The 10-year ASCVD risk score [name](Goff DC Montez HagemanJr., et al., 2013) is: 1%   Values used to calculate the score:     Age: 46 years     Sex: Female     Is Non-Hispanic African American: No     Diabetic: No     Tobacco smoker: No     Systolic Blood Pressure: 133 mmHg     Is BP treated: Yes     HDL Cholesterol: 44 mg/dL     Total Cholesterol: 157 mg/dL

## 2016-09-25 NOTE — Telephone Encounter (Signed)
Left a vm with patient son to give a call back

## 2016-09-28 NOTE — Telephone Encounter (Signed)
Tried to contact patient again with no answer. Left vm

## 2016-10-12 ENCOUNTER — Encounter (HOSPITAL_COMMUNITY): Payer: Self-pay | Admitting: Emergency Medicine

## 2016-10-12 ENCOUNTER — Ambulatory Visit (HOSPITAL_COMMUNITY)
Admission: EM | Admit: 2016-10-12 | Discharge: 2016-10-12 | Disposition: A | Discharge status: Home / Self Care | Payer: BLUE CROSS/BLUE SHIELD | Attending: Physician Assistant | Admitting: Physician Assistant

## 2016-10-12 DIAGNOSIS — J00 Acute nasopharyngitis [common cold]: Secondary | ICD-10-CM

## 2016-10-12 MED ORDER — IPRATROPIUM BROMIDE 0.06 % NA SOLN: 2.0000 | Freq: Four times a day (QID) | NASAL | 12 refills | Status: DC

## 2016-10-12 MED ORDER — CETIRIZINE-PSEUDOEPHEDRINE ER 5-120 MG PO TB12: 1.0000 | ORAL_TABLET | Freq: Every day | ORAL | 0 refills | Status: DC

## 2016-10-12 NOTE — Discharge Instructions (Signed)
Start atrovent nasal spray, continue flonase. Start zyrtec-D for nasal congestion, you will need to stop over the counter decongestant, as there may be duplicate ingredients. You can use over the counter nasal saline rinse such as neti pot for nasal congestion. Keep hydrated, your urine should be clear to pale yellow in color. Tylenol/motrin for fever and pain. Monitor for any worsening of symptoms, chest pain, shortness of breath, wheezing, swelling of the throat, follow up for reevaluation.

## 2016-10-12 NOTE — ED Triage Notes (Signed)
Sore throat, bilateral ear pain and headache.

## 2016-10-12 NOTE — ED Provider Notes (Signed)
[facility]    CSN: [id number] Arrival date & time: 10/12/16  1120     History   Chief Complaint Chief Complaint  Patient presents with  . Sore Throat    HPI Pamela [name] is a 46 y.o. female.   46 year old female with nasal congestion, sore throat, sneezing, bilateral ear pain for the past 7-10 days. She states it started after cleaning school room, and at first thought it was allergies. No cough, fever, chills, night sweats. Denies trouble breathing, swallowing, wheezing. She has tried Theraflu, otc decongestion, flonase without relief. Does have seasonal allergies, usually takes zyrtec, but has ran out. Denies n/v/d. Denies chest pain, shortness of breath.       Past Medical History:  Diagnosis Date  . Chest pain   . Degenerative joint disease   . Herpes simplex   . Hx of echocardiogram 2012   normal  . Hypertension   . Migraine   . Migraine without aura, with intractable migraine, so stated, without mention of status migrainosus 07/26/2013  . Normal cardiac stress test 2012  . Obesity   . Osteoarthritis of knee     Patient Active Problem List   Diagnosis Date Noted  . 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 Essential hypertension 02/21/2016  . Bradycardia 02/21/2016  . Hypertriglyceridemia 05/07/2015  . Murmur 11/10/2013  . Congenital heart disease 11/10/2013  . Migraine without aura, with intractable migraine, so stated, without mention of status migrainosus 07/26/2013  . Pap smear for cervical cancer screening 09/29/2012  . Morbid obesity (HCC) 09/29/2012  . Polyphagia(783.6) 09/29/2012  . Chest pain 09/03/2012    Past Surgical History:  Procedure Laterality Date  . ASD REPAIR  1977  . CARDIOVASCULAR STRESS TEST     negative  . CARPAL TUNNEL RELEASE    . CESAREAN SECTION    . EXPLORATION POST OPERATIVE OPEN HEART    . KNEE SURGERY  2012   right    OB History    No data available       Home Medications    Prior to Admission medications   Medication Sig Start Date End Date Taking? Authorizing Provider  acyclovir ointment (ZOVIRAX) 5 % Apply 1 application topically every 3 (three) hours. 08/06/16   Bing Neighbors, FNP  Ascorbic Acid (VITAMIN C PO) Take 1,000 mg by mouth daily.     [provider]  cetirizine-pseudoephedrine (ZYRTEC-D) 5-120 MG tablet Take 1 tablet by mouth daily. 10/12/16   Belinda Fisher, PA-C  Cholecalciferol (VITAMIN D3) 5000 UNITS TABS Take 5,000 Units by mouth daily.     [provider]  Cyanocobalamin (VITAMIN B 12 PO) Take 1 tablet by mouth daily.    [provider]  Echinacea 80 MG CAPS Take by mouth.    [provider]  gabapentin (NEURONTIN) 300 MG capsule Take 1 capsule (300 mg total) by mouth 4 (four) times daily. 08/11/16   Bing Neighbors, FNP  hydrALAZINE (APRESOLINE) 25 MG tablet Take 25 mg by mouth 2 (two) times daily.    Orpah Cobb, MD  ibuprofen (ADVIL,MOTRIN) 600 MG tablet Take 1 tablet (600 mg total) by mouth every 6 (six) hours as needed for moderate pain. 06/26/15   Charm Rings, MD  ipratropium (ATROVENT) 0.06 % nasal spray Place 2 sprays into both nostrils 4 (four) times daily. 10/12/16   Burk Hoctor V, PA-C  lidocaine (LIDODERM) 5 %  Place 1 patch onto the skin daily. Remove & Discard patch within 12 hours or as directed by MD 08/06/16   Bing Neighbors, FNP  Magnesium 500 MG TABS Take 2,500 mg by mouth daily.    [provider]  meloxicam (MOBIC) 15 MG tablet Take 15 mg by mouth daily.    Gean Birchwood, MD  Omega-3 Fatty Acids (FISH OIL) 1200 MG CPDR Take 1,200 mg by mouth at bedtime.     [provider]  rizatriptan (MAXALT-MLT) 10 MG disintegrating tablet Take 1 tablet (10 mg total) by mouth 3 (three) times daily as needed for migraine. May repeat in 2 hours if needed 08/06/16   Bing Neighbors, FNP  valACYclovir (VALTREX) 500 MG tablet Take 1 tablet (500 mg  total) by mouth 2 (two) times daily. 08/06/16   Bing Neighbors, FNP    Family History Family History  Problem Relation Age of Onset  . Coronary artery disease Mother 31  . Stroke Mother   . Migraines Mother   . Migraines Sister   . Other Unknown         There is also history of diabetes, stroke, and cancer in the family    Social History Social History  Substance Use Topics  . Smoking status: Never Smoker  . Smokeless tobacco: Never Used  . Alcohol use No     Comment: occasional     Allergies   Amoxicillin; Bee venom; Imitrex [sumatriptan]; Tramadol; and Vicodin [hydrocodone-acetaminophen]   Review of Systems Review of Systems  Reason unable to perform ROS: See HPI as above.     Physical Exam Triage Vital Signs ED Triage Vitals  Enc Vitals Group     BP 10/12/16 1236 124/67     Pulse Rate 10/12/16 1236 (!) 50     Resp 10/12/16 1236 20     Temp 10/12/16 1236 98 F (36.7 C)     Temp Source 10/12/16 1236 Oral     SpO2 10/12/16 1236 97 %     Weight --      Height --      Head Circumference --      Peak Flow --      Pain Score 10/12/16 1234 8     Pain Loc --      Pain Edu? --      Excl. in GC? --    No data found.   Updated Vital Signs BP 124/67 (BP Location: Left Arm)   Pulse (!) 50   Temp 98 F (36.7 C) (Oral)   Resp 20   LMP 08/07/2010 (Approximate) Comment: Patient declined pregnancy test  SpO2 97%   Physical Exam  Constitutional: She is oriented to person, place, and time. She appears well-developed and well-nourished. No distress.  HENT:  Head: Normocephalic and atraumatic.  Right Ear: Tympanic membrane, external ear and ear canal normal. Tympanic membrane is not erythematous and not bulging.  Left Ear: Tympanic membrane, external ear and ear canal normal. Tympanic membrane is not erythematous and not bulging.  Nose: Mucosal edema present. Right sinus exhibits maxillary sinus tenderness and frontal sinus tenderness. Left sinus exhibits  maxillary sinus tenderness and frontal sinus tenderness.  Mouth/Throat: Uvula is midline, oropharynx is clear and moist and mucous membranes are normal.  Eyes: Pupils are equal, round, and reactive to light. Conjunctivae are normal.  Neck: Normal range of motion. Neck supple.  Cardiovascular: Normal rate, regular rhythm and normal heart sounds.  Exam reveals no gallop and no  friction rub.   No murmur heard. Pulmonary/Chest: Effort normal and breath sounds normal. She has no decreased breath sounds. She has no wheezes. She has no rhonchi. She has no rales.  Lymphadenopathy:    She has no cervical adenopathy.  Neurological: She is alert and oriented to person, place, and time.  Skin: Skin is warm and dry.  Psychiatric: She has a normal mood and affect. Her behavior is normal. Judgment normal.     UC Treatments / Results  Labs (all labs ordered are listed, but only abnormal results are displayed) Labs Reviewed - No data to display  EKG  EKG Interpretation None       Radiology No results found.  Procedures Procedures (including critical care time)  Medications Ordered in UC Medications - No data to display   Initial Impression / Assessment and Plan / UC Course  I have reviewed the triage vital signs and the nursing notes.  Pertinent labs & imaging results that were available during my care of the patient were reviewed by me and considered in my medical decision making (see chart for details).    Discussed with patient history and exam most consistent with viral URI. Symptomatic treatment as needed. Patient to go through otc medication ingredients to avoid duplicate medications. Push fluids. Return precautions given.   Final Clinical Impressions(s) / UC Diagnoses   Final diagnoses:  Acute nasopharyngitis    New Prescriptions New Prescriptions   CETIRIZINE-PSEUDOEPHEDRINE (ZYRTEC-D) 5-120 MG TABLET    Take 1 tablet by mouth daily.   IPRATROPIUM (ATROVENT) 0.06 % NASAL  SPRAY    Place 2 sprays into both nostrils 4 (four) times daily.       Belinda FisherYu, Leaha Cuervo V, PA-C 10/12/16 1325

## 2016-11-09 ENCOUNTER — Other Ambulatory Visit: Payer: Self-pay | Admitting: Family Medicine

## 2016-12-08 ENCOUNTER — Other Ambulatory Visit: Payer: Self-pay | Admitting: Family Medicine

## 2016-12-08 DIAGNOSIS — Z0000 Encounter for general adult medical examination without abnormal findings: Secondary | ICD-10-CM

## 2017-01-04 ENCOUNTER — Ambulatory Visit: Payer: BLUE CROSS/BLUE SHIELD

## 2017-01-07 ENCOUNTER — Inpatient Hospital Stay: Admission: RE | Admit: 2017-01-07 | Payer: BLUE CROSS/BLUE SHIELD | Source: Ambulatory Visit

## 2017-02-05 ENCOUNTER — Encounter: Payer: Self-pay | Admitting: Family Medicine

## 2017-02-05 ENCOUNTER — Ambulatory Visit (INDEPENDENT_AMBULATORY_CARE_PROVIDER_SITE_OTHER): Payer: BLUE CROSS/BLUE SHIELD | Admitting: Family Medicine

## 2017-02-05 VITALS — BP 137/59 | HR 48 | Temp 98.5°F | Resp 14 | Ht 63.0 in | Wt 238.0 lb

## 2017-02-05 DIAGNOSIS — F4321 Adjustment disorder with depressed mood: Secondary | ICD-10-CM

## 2017-02-05 DIAGNOSIS — Z131 Encounter for screening for diabetes mellitus: Secondary | ICD-10-CM

## 2017-02-05 DIAGNOSIS — I10 Essential (primary) hypertension: Secondary | ICD-10-CM

## 2017-02-05 DIAGNOSIS — B009 Herpesviral infection, unspecified: Secondary | ICD-10-CM

## 2017-02-05 DIAGNOSIS — E781 Pure hyperglyceridemia: Secondary | ICD-10-CM

## 2017-02-05 LAB — POCT URINALYSIS DIP (DEVICE)
BILIRUBIN URINE: NEGATIVE
GLUCOSE, UA: NEGATIVE mg/dL
HGB URINE DIPSTICK: NEGATIVE
Ketones, ur: NEGATIVE mg/dL
LEUKOCYTES UA: NEGATIVE
Nitrite: NEGATIVE
Protein, ur: 30 mg/dL — AB
Specific Gravity, Urine: >=1.03 (ref 1.005–1.030)
UROBILINOGEN UA: 0.2 mg/dL (ref 0.0–1.0)
pH: 5 (ref 5.0–8.0)

## 2017-02-05 LAB — POCT GLYCOSYLATED HEMOGLOBIN (HGB A1C): HEMOGLOBIN A1C: 5.2

## 2017-02-05 MED ORDER — VALACYCLOVIR HCL 500 MG PO TABS: 500.0000 mg | ORAL_TABLET | Freq: Two times a day (BID) | ORAL | 11 refills | Status: DC

## 2017-02-05 NOTE — Patient Instructions (Addendum)
Hypertension: Your blood pressure is at goal, will continue current medication regimen. - Continue medication, monitor blood pressure at home. Continue DASH diet. Reminder to go to the ER if any CP, SOB, nausea, dizziness, severe HA, changes vision/speech, left arm numbness and tingling and jaw pain.  Recommend a lowfat, low carbohydrate diet divided over 5-6 small meals, increase water intake to 6-8 glasses, and 150 minutes per week of cardiovascular exercise.  DASH Eating Plan DASH stands for "Dietary Approaches to Stop Hypertension." The DASH eating plan is a healthy eating plan that has been shown to reduce high blood pressure (hypertension). It may also reduce your risk for type 2 diabetes, heart disease, and stroke. The DASH eating plan may also help with weight loss. What are tips for following this plan? General guidelines  Avoid eating more than 2,300 mg (milligrams) of salt (sodium) a day. If you have hypertension, you may need to reduce your sodium intake to 1,500 mg a day.  Limit alcohol intake to no more than 1 drink a day for nonpregnant women and 2 drinks a day for men. One drink equals 12 oz of beer, 5 oz of wine, or 1 oz of hard liquor.  Work with your health care provider to maintain a healthy body weight or to lose weight. Ask what an ideal weight is for you.  Get at least 30 minutes of exercise that causes your heart to beat faster (aerobic exercise) most days of the week. Activities may include walking, swimming, or biking.  Work with your health care provider or diet and nutrition specialist (dietitian) to adjust your eating plan to your individual calorie needs. Reading food labels  Check food labels for the amount of sodium per serving. Choose foods with less than 5 percent of the Daily Value of sodium. Generally, foods with less than 300 mg of sodium per serving fit into this eating plan.  To find whole grains, look for the word "whole" as the first word in the  ingredient list. Shopping  Buy products labeled as "low-sodium" or "no salt added."  Buy fresh foods. Avoid canned foods and premade or frozen meals. Cooking  Avoid adding salt when cooking. Use salt-free seasonings or herbs instead of table salt or sea salt. Check with your health care provider or pharmacist before using salt substitutes.  Do not fry foods. Cook foods using healthy methods such as baking, boiling, grilling, and broiling instead.  Cook with heart-healthy oils, such as olive, canola, soybean, or sunflower oil. Meal planning   Eat a balanced diet that includes: ? 5 or more servings of fruits and vegetables each day. At each meal, try to fill half of your plate with fruits and vegetables. ? Up to 6-8 servings of whole grains each day. ? Less than 6 oz of lean meat, poultry, or fish each day. A 3-oz serving of meat is about the same size as a deck of cards. One egg equals 1 oz. ? 2 servings of low-fat dairy each day. ? A serving of nuts, seeds, or beans 5 times each week. ? Heart-healthy fats. Healthy fats called Omega-3 fatty acids are found in foods such as flaxseeds and coldwater fish, like sardines, salmon, and mackerel.  Limit how much you eat of the following: ? Canned or prepackaged foods. ? Food that is high in trans fat, such as fried foods. ? Food that is high in saturated fat, such as fatty meat. ? Sweets, desserts, sugary drinks, and other foods with added  sugar. ? Full-fat dairy products.  Do not salt foods before eating.  Try to eat at least 2 vegetarian meals each week.  Eat more home-cooked food and less restaurant, buffet, and fast food.  When eating at a restaurant, ask that your food be prepared with less salt or no salt, if possible. What foods are recommended? The items listed may not be a complete list. Talk with your dietitian about what dietary choices are best for you. Grains Whole-grain or whole-wheat bread. Whole-grain or whole-wheat  pasta. Brown rice. [place]. Bulgur. Whole-grain and low-sodium cereals. Pita bread. Low-fat, low-sodium crackers. Whole-wheat flour tortillas. Vegetables Fresh or frozen vegetables (raw, steamed, roasted, or grilled). Low-sodium or reduced-sodium tomato and vegetable juice. Low-sodium or reduced-sodium tomato sauce and tomato paste. Low-sodium or reduced-sodium canned vegetables. Fruits All fresh, dried, or frozen fruit. Canned fruit in natural juice (without added sugar). Meat and other protein foods Skinless chicken or [place]. Ground chicken or [place]. Pork with fat trimmed off. Fish and seafood. Egg whites. Dried beans, peas, or lentils. Unsalted nuts, nut butters, and seeds. Unsalted canned beans. Lean cuts of beef with fat trimmed off. Low-sodium, lean deli meat. Dairy Low-fat (1%) or fat-free (skim) milk. Fat-free, low-fat, or reduced-fat cheeses. Nonfat, low-sodium ricotta or cottage cheese. Low-fat or nonfat yogurt. Low-fat, low-sodium cheese. Fats and oils Soft margarine without trans fats. Vegetable oil. Low-fat, reduced-fat, or light mayonnaise and salad dressings (reduced-sodium). Canola, safflower, olive, soybean, and sunflower oils. Avocado. Seasoning and other foods Herbs. Spices. Seasoning mixes without salt. Unsalted popcorn and pretzels. Fat-free sweets. What foods are not recommended? The items listed may not be a complete list. Talk with your dietitian about what dietary choices are best for you. Grains Baked goods made with fat, such as croissants, muffins, or some breads. Dry pasta or rice meal packs. Vegetables Creamed or fried vegetables. Vegetables in a cheese sauce. Regular canned vegetables (not low-sodium or reduced-sodium). Regular canned tomato sauce and paste (not low-sodium or reduced-sodium). Regular tomato and vegetable juice (not low-sodium or reduced-sodium). Angie Fava. Olives. Fruits Canned fruit in a light or heavy syrup. Fried fruit. Fruit in cream or  butter sauce. Meat and other protein foods Fatty cuts of meat. Ribs. Fried meat. Berniece Salines. Sausage. Bologna and other processed lunch meats. Salami. Fatback. Hotdogs. Bratwurst. Salted nuts and seeds. Canned beans with added salt. Canned or smoked fish. Whole eggs or egg yolks. Chicken or [place] with skin. Dairy Whole or 2% milk, cream, and half-and-half. Whole or full-fat cream cheese. Whole-fat or sweetened yogurt. Full-fat cheese. Nondairy creamers. Whipped toppings. Processed cheese and cheese spreads. Fats and oils Butter. Stick margarine. Lard. Shortening. Ghee. Bacon fat. Tropical oils, such as coconut, palm kernel, or palm oil. Seasoning and other foods Salted popcorn and pretzels. Onion salt, garlic salt, seasoned salt, table salt, and sea salt. Worcestershire sauce. Tartar sauce. Barbecue sauce. Teriyaki sauce. Soy sauce, including reduced-sodium. Steak sauce. Canned and packaged gravies. Fish sauce. Oyster sauce. Cocktail sauce. Horseradish that you find on the shelf. Ketchup. Mustard. Meat flavorings and tenderizers. Bouillon cubes. Hot sauce and Tabasco sauce. Premade or packaged marinades. Premade or packaged taco seasonings. Relishes. Regular salad dressings. Where to find more information:  National Heart, Lung, and [place]: https://wilson-eaton.com/  American Heart Association: www.heart.org Summary  The DASH eating plan is a healthy eating plan that has been shown to reduce high blood pressure (hypertension). It may also reduce your risk for type 2 diabetes, heart disease, and stroke.  With the DASH eating  plan, you should limit salt (sodium) intake to 2,300 mg a day. If you have hypertension, you may need to reduce your sodium intake to 1,500 mg a day.  When on the DASH eating plan, aim to eat more fresh fruits and vegetables, whole grains, lean proteins, low-fat dairy, and heart-healthy fats.  Work with your health care provider or diet and nutrition specialist (dietitian) to  adjust your eating plan to your individual calorie needs. This information is not intended to replace advice given to you by your health care provider. Make sure you discuss any questions you have with your health care provider. Document Released: 01/15/2011 Document Revised: 01/20/2016 Document Reviewed: 01/20/2016 Elsevier Interactive Patient Education  2018 [employer].  Back Exercises If you have pain in your back, do these exercises 2-3 times each day or as told by your doctor. When the pain goes away, do the exercises once each day, but repeat the steps more times for each exercise (do more repetitions). If you do not have pain in your back, do these exercises once each day or as told by your doctor. Exercises Single Knee to Chest  Do these steps 3-5 times in a row for each leg: 1. Lie on your back on a firm bed or the floor with your legs stretched out. 2. Bring one knee to your chest. 3. Hold your knee to your chest by grabbing your knee or thigh. 4. Pull on your knee until you feel a gentle stretch in your lower back. 5. Keep doing the stretch for 10-30 seconds. 6. Slowly let go of your leg and straighten it.  Pelvic Tilt  Do these steps 5-10 times in a row: 1. Lie on your back on a firm bed or the floor with your legs stretched out. 2. Bend your knees so they point up to the ceiling. Your feet should be flat on the floor. 3. Tighten your lower belly (abdomen) muscles to press your lower back against the floor. This will make your tailbone point up to the ceiling instead of pointing down to your feet or the floor. 4. Stay in this position for 5-10 seconds while you gently tighten your muscles and breathe evenly.  Cat-Cow  Do these steps until your lower back bends more easily: 1. Get on your hands and knees on a firm surface. Keep your hands under your shoulders, and keep your knees under your hips. You may put padding under your knees. 2. Let your head hang down, and make  your tailbone point down to the floor so your lower back is round like the back of a cat. 3. Stay in this position for 5 seconds. 4. Slowly lift your head and make your tailbone point up to the ceiling so your back hangs low (sags) like the back of a cow. 5. Stay in this position for 5 seconds.  Press-Ups  Do these steps 5-10 times in a row: 1. Lie on your belly (face-down) on the floor. 2. Place your hands near your head, about shoulder-width apart. 3. While you keep your back relaxed and keep your hips on the floor, slowly straighten your arms to raise the top half of your body and lift your shoulders. Do not use your back muscles. To make yourself more comfortable, you may change where you place your hands. 4. Stay in this position for 5 seconds. 5. Slowly return to lying flat on the floor.  Bridges  Do these steps 10 times in a row: 1. Lorenz Coaster  on your back on a firm surface. 2. Bend your knees so they point up to the ceiling. Your feet should be flat on the floor. 3. Tighten your butt muscles and lift your butt off of the floor until your waist is almost as high as your knees. If you do not feel the muscles working in your butt and the back of your thighs, slide your feet 1-2 inches farther away from your butt. 4. Stay in this position for 3-5 seconds. 5. Slowly lower your butt to the floor, and let your butt muscles relax.  If this exercise is too easy, try doing it with your arms crossed over your chest. Belly Crunches  Do these steps 5-10 times in a row: 1. Lie on your back on a firm bed or the floor with your legs stretched out. 2. Bend your knees so they point up to the ceiling. Your feet should be flat on the floor. 3. Cross your arms over your chest. 4. Tip your chin a little bit toward your chest but do not bend your neck. 5. Tighten your belly muscles and slowly raise your chest just enough to lift your shoulder blades a tiny bit off of the floor. 6. Slowly lower your chest  and your head to the floor.  Back Lifts Do these steps 5-10 times in a row: 1. Lie on your belly (face-down) with your arms at your sides, and rest your forehead on the floor. 2. Tighten the muscles in your legs and your butt. 3. Slowly lift your chest off of the floor while you keep your hips on the floor. Keep the back of your head in line with the curve in your back. Look at the floor while you do this. 4. Stay in this position for 3-5 seconds. 5. Slowly lower your chest and your face to the floor.  Contact a doctor if:  Your back pain gets a lot worse when you do an exercise.  Your back pain does not lessen 2 hours after you exercise. If you have any of these problems, stop doing the exercises. Do not do them again unless your doctor says it is okay. Get help right away if:  You have sudden, very bad back pain. If this happens, stop doing the exercises. Do not do them again unless your doctor says it is okay. This information is not intended to replace advice given to you by your health care provider. Make sure you discuss any questions you have with your health care provider. Document Released: 02/28/2010 Document Revised: 07/04/2015 Document Reviewed: 03/22/2014 Elsevier Interactive Patient Education  2018 [employer].  Exercising to [employer] Exercising can help you to lose weight. In order to lose weight through exercise, you need to do vigorous-intensity exercise. You can tell that you are exercising with vigorous intensity if you are breathing very hard and fast and cannot hold a conversation while exercising. Moderate-intensity exercise helps to maintain your current weight. You can tell that you are exercising at a moderate level if you have a higher heart rate and faster breathing, but you are still able to hold a conversation. How often should I exercise? Choose an activity that you enjoy and set realistic goals. Your health care provider can help you to make an activity  plan that works for you. Exercise regularly as directed by your health care provider. This may include:  Doing resistance training twice each week, such as: ? Push-ups. ? Sit-ups. ? Lifting weights. ? Using  resistance bands.  Doing a given intensity of exercise for a given amount of time. Choose from these options: ? 150 minutes of moderate-intensity exercise every week. ? 75 minutes of vigorous-intensity exercise every week. ? A mix of moderate-intensity and vigorous-intensity exercise every week.  Children, pregnant women, people who are out of shape, people who are overweight, and older adults may need to consult a health care provider for individual recommendations. If you have any sort of medical condition, be sure to consult your health care provider before starting a new exercise program. What are some activities that can help me to lose weight?  Walking at a rate of at least 4.5 miles an hour.  Jogging or running at a rate of 5 miles per hour.  Biking at a rate of at least 10 miles per hour.  Lap swimming.  Roller-skating or in-line skating.  Cross-country skiing.  Vigorous competitive sports, such as football, basketball, and soccer.  Jumping rope.  Aerobic dancing. How can I be more active in my day-to-day activities?  Use the stairs instead of the elevator.  Take a walk during your lunch break.  If you drive, park your car farther away from work or school.  If you take public transportation, get off one stop early and walk the rest of the way.  Make all of your phone calls while standing up and walking around.  Get up, stretch, and walk around every 30 minutes throughout the day. What guidelines should I follow while exercising?  Do not exercise so much that you hurt yourself, feel dizzy, or get very short of breath.  Consult your health care provider prior to starting a new exercise program.  Wear comfortable clothes and shoes with good  support.  Drink plenty of water while you exercise to prevent dehydration or heat stroke. Body water is lost during exercise and must be replaced.  Work out until you breathe faster and your heart beats faster. This information is not intended to replace advice given to you by your health care provider. Make sure you discuss any questions you have with your health care provider. Document Released: 02/28/2010 Document Revised: 07/04/2015 Document Reviewed: 06/29/2013 Elsevier Interactive Patient Education  [employer]2018 Elsevier Inc.

## 2017-02-05 NOTE — Progress Notes (Signed)
52

## 2017-02-05 NOTE — Progress Notes (Signed)
Subjective:    Patient ID: Pamela [name], female    DOB: 30-Nov-1970, 46 y.o.   MRN: [id number]  HPI  [name], a 46 year old female with a history of hyperlipidemia, morbid obesity, and vitamin D deficiency presents for a follow up of chronic conditions. [name] has not been following a low fat, low carbohydrate diet. She says that she has not been doing cardiovascular exercises due to time constraints. She maintains that she has been taking medications consistently. She denies chest pain, shortness of breath, fatigue, nausea, vomiting, or diarrhea. Patient's blood pressure has been mildly elevated. She is followed by cardiology for bradycardia. [name] did not take antihypertensive medication prior to arrival.   Past Medical History:  Diagnosis Date  . Chest pain   . Degenerative joint disease   . Herpes simplex   . Hx of echocardiogram 2012   normal  . Hypertension   . Migraine   . Migraine without aura, with intractable migraine, so stated, without mention of status migrainosus 07/26/2013  . Normal cardiac stress test 2012  . Obesity   . Osteoarthritis of knee    Social History   Socioeconomic History  . Marital status: Legally Separated    Spouse name: Not on file  . Number of children: 4  . Years of education: college AS  . Highest education level: Not on file  Social Needs  . Financial resource strain: Not on file  . Food insecurity - worry: Not on file  . Food insecurity - inability: Not on file  . Transportation needs - medical: Not on file  . Transportation needs - non-medical: Not on file  Occupational History  . Occupation: Magazine features editorTeacher    Employer: [employer]    Employer: [employer]  Tobacco Use  . Smoking status: Never Smoker  . Smokeless tobacco: Never Used  Substance and Sexual Activity  . Alcohol use: No    Comment: occasional  . Drug use: No  . Sexual activity: Yes    Birth control/protection: None  Other  Topics Concern  . Not on file  Social History Narrative  . Not on file   Immunization History  Administered Date(s) Administered  . Tdap 11/22/2015   Review of Systems  Constitutional: Negative.   HENT: Negative.   Eyes: Negative.   Respiratory: Negative.  Negative for cough, chest tightness and shortness of breath.   Cardiovascular: Negative.   Endocrine: Negative.   Genitourinary: Negative.   Musculoskeletal: Negative.   Skin: Negative.   Allergic/Immunologic: Negative.   Neurological: Negative.   Hematological: Negative.   Psychiatric/Behavioral: Negative.        Objective:   Physical Exam  Constitutional: She is oriented to person, place, and time. She appears well-developed and well-nourished.  HENT:  Head: Normocephalic and atraumatic.  Right Ear: External ear normal.  Left Ear: External ear normal.  Nose: Nose normal.  Mouth/Throat: Oropharynx is clear and moist.  Eyes: Conjunctivae and EOM are normal. Pupils are equal, round, and reactive to light.  Neck: Normal range of motion. Neck supple.  Cardiovascular: Regular rhythm, normal heart sounds and intact distal pulses. Bradycardia present.  Pulses:      Carotid pulses are 2+ on the right side, and 2+ on the left side.      Radial pulses are 2+ on the right side, and 2+ on the left side.       Femoral pulses are 2+ on the right side, and 2+  on the left side.      Popliteal pulses are 2+ on the right side, and 2+ on the left side.       Dorsalis pedis pulses are 2+ on the right side, and 2+ on the left side.       Posterior tibial pulses are 2+ on the right side, and 2+ on the left side.  Pulmonary/Chest: Effort normal and breath sounds normal.  Abdominal: Soft. Bowel sounds are normal.  Abdominal obesity  Musculoskeletal: Normal range of motion.  Neurological: She is alert and oriented to person, place, and time. She has normal reflexes.  Skin: Skin is warm and dry.  Psychiatric: She has a normal mood and  affect. Her behavior is normal. Judgment and thought content normal.      BP (!) 137/59 (BP Location: Left Arm, Patient Position: Sitting, Cuff Size: Large)   Pulse (!) 48 Comment: manually  Temp 98.5 F (36.9 C) (Oral)   Resp 14   Ht 5\' 3"  (1.6 m)   Wt 238 lb (108 kg)   LMP 08/07/2010 (Approximate) Comment: Patient declined pregnancy test  SpO2 98%   BMI 42.16 kg/m  Assessment & Plan:  1. Essential hypertension Blood pressure is at goal. Goal is <140/90. No medication changes warranted on today.  Reviewed urinalysis, proteinuria present. Microalbumin added on  - Microalbumin/Creatinine Ratio, Urine - Basic Metabolic Panel - POCT urinalysis dip (device)  2. Hypertriglyceridemia The 10-year ASCVD risk score [name](Goff DC Montez HagemanJr., et al., 2013) is: 1.2%   Values used to calculate the score:     Age: [age] years     Sex: Female     Is Non-Hispanic African American: No     Diabetic: No     Tobacco smoker: No     Systolic Blood Pressure: 137 mmHg     Is BP treated: Yes     HDL Cholesterol: 44 mg/dL     Total Cholesterol: 157 mg/dL  - Lipid Panel; Future  3. Morbid obesity (HCC) Body mass index is 42.16 kg/m. Goal is 25-27 Recommend a lowfat, low carbohydrate diet divided over 5-6 small meals, increase water intake to 6-8 glasses, and 150 minutes per week of cardiovascular exercise.    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  Dispense: 60 tablet; Refill: 11  5. Screening for diabetes mellitus - HgB A1c  6. Situational depression Discussed volatile home situation at length. Depression is primarily situational. Patient may benefit from a referral to psychology  - Ambulatory referral to Psychology  RTC; 6 months for hypertension   Nolon NationsLachina Moore Caliegh Middlekauff  MSN, FNP-C [facility] [address]  [place], [place] [id number] [phone]

## 2017-02-06 LAB — BASIC METABOLIC PANEL
BUN/Creatinine Ratio: 30 — ABNORMAL HIGH (ref 9–23)
BUN: 19 mg/dL (ref 6–24)
CALCIUM: 9.4 mg/dL (ref 8.7–10.2)
CHLORIDE: 105 mmol/L (ref 96–106)
CO2: 20 mmol/L (ref 20–29)
Creatinine, Ser: 0.64 mg/dL (ref 0.57–1.00)
GFR calc Af Amer: 125 mL/min/{1.73_m2} (ref >59)
GFR calc non Af Amer: 108 mL/min/{1.73_m2} (ref >59)
GLUCOSE: 91 mg/dL (ref 65–99)
Potassium: 4.6 mmol/L (ref 3.5–5.2)
Sodium: 142 mmol/L (ref 134–144)

## 2017-02-06 LAB — MICROALBUMIN / CREATININE URINE RATIO
Creatinine, Urine: 156.9 mg/dL
Microalb/Creat Ratio: 150.1 mg/g creat — ABNORMAL HIGH (ref 0.0–30.0)
Microalbumin, Urine: 235.5 ug/mL

## 2017-03-08 ENCOUNTER — Other Ambulatory Visit: Payer: BLUE CROSS/BLUE SHIELD

## 2017-03-08 ENCOUNTER — Telehealth: Payer: Self-pay

## 2017-03-08 DIAGNOSIS — E781 Pure hyperglyceridemia: Secondary | ICD-10-CM

## 2017-03-09 ENCOUNTER — Telehealth: Payer: Self-pay

## 2017-03-09 ENCOUNTER — Other Ambulatory Visit: Payer: Self-pay | Admitting: Family Medicine

## 2017-03-09 LAB — LIPID PANEL
CHOLESTEROL TOTAL: 147 mg/dL (ref 100–199)
Chol/HDL Ratio: 3.4 ratio (ref 0.0–4.4)
HDL: 43 mg/dL (ref >39)
LDL Calculated: 63 mg/dL (ref 0–99)
Triglycerides: 206 mg/dL — ABNORMAL HIGH (ref 0–149)
VLDL Cholesterol Cal: 41 mg/dL — ABNORMAL HIGH (ref 5–40)

## 2017-03-09 NOTE — Telephone Encounter (Signed)
Patient returned call and I advised of triglycerides improving and that she should continue daily fish oil capsule, low fat diet, and exercise. She verbalized understanding and will keep next scheduled office visit. Thanks!

## 2017-03-09 NOTE — Telephone Encounter (Signed)
-----   Message from Massie MaroonLachina M Hollis, [place]NP sent at 03/09/2017 11:00 AM EST ----- Regarding: lab results Please inform patient that triglycerides have improved. Continue daily fish oil capsule. Also, recommend a low fat, low cholesterol diet and low impact cardiovascular exercise routine. Follow up in office as scheduled.   Thanks

## 2017-03-09 NOTE — Telephone Encounter (Signed)
Called, no answer. Left a message for patient to call back and left call back number. Thanks!  

## 2017-04-01 NOTE — Telephone Encounter (Signed)
Patient notified

## 2017-04-04 ENCOUNTER — Other Ambulatory Visit: Payer: Self-pay | Admitting: Family Medicine

## 2017-04-17 ENCOUNTER — Ambulatory Visit (HOSPITAL_COMMUNITY)
Admission: EM | Admit: 2017-04-17 | Discharge: 2017-04-17 | Disposition: A | Discharge status: Home / Self Care | Payer: BLUE CROSS/BLUE SHIELD | Attending: Family Medicine | Admitting: Family Medicine

## 2017-04-17 ENCOUNTER — Encounter (HOSPITAL_COMMUNITY): Payer: Self-pay | Admitting: Family Medicine

## 2017-04-17 DIAGNOSIS — B349 Viral infection, unspecified: Secondary | ICD-10-CM | POA: Diagnosis not present

## 2017-04-17 DIAGNOSIS — M791 Myalgia, unspecified site: Secondary | ICD-10-CM

## 2017-04-17 DIAGNOSIS — J029 Acute pharyngitis, unspecified: Secondary | ICD-10-CM

## 2017-04-17 DIAGNOSIS — R05 Cough: Secondary | ICD-10-CM

## 2017-04-17 DIAGNOSIS — R0981 Nasal congestion: Secondary | ICD-10-CM | POA: Diagnosis not present

## 2017-04-17 MED ORDER — PREDNISONE 20 MG PO TABS: 40.0000 mg | ORAL_TABLET | Freq: Every day | ORAL | 0 refills | Status: AC

## 2017-04-17 MED ORDER — BENZONATATE 100 MG PO CAPS: 100.0000 mg | ORAL_CAPSULE | Freq: Three times a day (TID) | ORAL | 0 refills | Status: DC

## 2017-04-17 MED ORDER — IPRATROPIUM BROMIDE 0.06 % NA SOLN: 2.0000 | Freq: Four times a day (QID) | NASAL | 0 refills | Status: DC

## 2017-04-17 MED ORDER — KETOROLAC TROMETHAMINE 30 MG/ML IJ SOLN
30.0000 mg | Freq: Once | INTRAMUSCULAR | Status: AC
  Administered 2017-04-17: 30 mg via INTRAMUSCULAR

## 2017-04-17 MED ORDER — KETOROLAC TROMETHAMINE 30 MG/ML IJ SOLN
INTRAMUSCULAR | Status: AC
  Filled 2017-04-17: qty 1

## 2017-04-17 MED ORDER — MELOXICAM 7.5 MG PO TABS: 7.5000 mg | ORAL_TABLET | Freq: Every day | ORAL | 0 refills | Status: DC

## 2017-04-17 MED ORDER — DEXAMETHASONE SODIUM PHOSPHATE 10 MG/ML IJ SOLN
INTRAMUSCULAR | Status: AC
  Filled 2017-04-17: qty 1

## 2017-04-17 MED ORDER — DEXAMETHASONE SODIUM PHOSPHATE 10 MG/ML IJ SOLN
10.0000 mg | Freq: Once | INTRAMUSCULAR | Status: AC
Start: 2017-04-17 — End: 2017-04-17
  Administered 2017-04-17: 10 mg via INTRAMUSCULAR

## 2017-04-17 NOTE — ED Provider Notes (Signed)
[facility]    CSN: [id number] Arrival date & time: 04/17/17  1200     History   Chief Complaint Chief Complaint  Patient presents with  . Facial Pain    HPI Pamela [name] is a 47 y.o. female.   47 year old female with history of HTN, HLD, bradycardia, HSV, comes in for 2 day history of URI symptoms.  Has had sinus congestion, head pressure, cough, ear pain, rhinorrhea, nasal congestion, sore throat. Denies fever, chills, night sweats. otc cold medicine, mucinex-D, flonase, robitussin DM with little relief.  Positive sick contact.  Never smoker.  Patient with elevated blood pressure today, states that she has had normal readings at her cardiologist and PCP office.  Cardiology visit was last week.  She has been taking multiple cold medications due to current symptoms.  Denies chest pain, shortness of breath, palpitation, weakness, dizziness, blurry vision.  She does have a headache, though describes it more of sinus pressure.  She is not taking any antihypertensives currently.  She will be following up with cardiology next week for a scheduled echocardiogram.      Past Medical History:  Diagnosis Date  . Chest pain   . Degenerative joint disease   . Herpes simplex   . Hx of echocardiogram 2012   normal  . Hypertension   . Migraine   . Migraine without aura, with intractable migraine, so stated, without mention of status migrainosus 07/26/2013  . Normal cardiac stress test 2012  . Obesity   . Osteoarthritis of knee     Patient Active Problem List   Diagnosis Date Noted  . 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 Essential hypertension 02/21/2016  . Bradycardia 02/21/2016  . Hypertriglyceridemia 05/07/2015  . Murmur 11/10/2013  . Congenital heart disease 11/10/2013  . Migraine without aura, with intractable migraine, so stated, without mention of status migrainosus 07/26/2013  . Pap smear  for cervical cancer screening 09/29/2012  . Morbid obesity (HCC) 09/29/2012  . Polyphagia(783.6) 09/29/2012  . Chest pain 09/03/2012    Past Surgical History:  Procedure Laterality Date  . ASD REPAIR  1977  . CARDIOVASCULAR STRESS TEST     negative  . CARPAL TUNNEL RELEASE    . CESAREAN SECTION    . EXPLORATION POST OPERATIVE OPEN HEART    . KNEE SURGERY  2012   right    OB History    No data available       Home Medications    Prior to Admission medications   Medication Sig Start Date End Date Taking? Authorizing Provider  acyclovir ointment (ZOVIRAX) 5 % Apply 1 application topically every 3 (three) hours. 08/06/16   Bing NeighborsHarris, Kimberly S, FNP  Ascorbic Acid (VITAMIN C PO) Take 1,000 mg by mouth daily.     [provider]  benzonatate (TESSALON) 100 MG capsule Take 1 capsule (100 mg total) by mouth every 8 (eight) hours. 04/17/17   Cathie HoopsYu, Keiasha Diep V, PA-C  cetirizine-pseudoephedrine (ZYRTEC-D) 5-120 MG tablet Take 1 tablet by mouth daily. Patient not taking: Reported on 02/05/2017 10/12/16   Belinda FisherYu, Jakyren Fluegge V, PA-C  Cholecalciferol (VITAMIN D3) 5000 UNITS TABS Take 5,000 Units by mouth daily.     [provider]  Cyanocobalamin (VITAMIN B 12 PO) Take 1 tablet by mouth daily.    [provider]  diltiazem (CARDIZEM) 30 MG tablet Take 30 mg by mouth 4 (four) times daily.    [provider]  Echinacea 80 MG CAPS Take by mouth.    [provider]  gabapentin (NEURONTIN) 300 MG capsule TAKE ONE CAPSULE BY MOUTH FOUR TIMES DAILY 04/05/17   Bing Neighbors, FNP  hydrALAZINE (APRESOLINE) 25 MG tablet Take 25 mg by mouth 2 (two) times daily.    Orpah Cobb, MD  ipratropium (ATROVENT) 0.06 % nasal spray Place 2 sprays into both nostrils 4 (four) times daily. 04/17/17   Cathie Hoops, Carsen Machi V, PA-C  lidocaine (LIDODERM) 5 % Place 1 patch onto the skin daily. Remove & Discard patch within 12 hours or as directed by MD Patient not taking: Reported on 02/05/2017 08/06/16    Bing Neighbors, FNP  Magnesium 500 MG TABS Take 2,500 mg by mouth daily.    [provider]  meloxicam (MOBIC) 7.5 MG tablet Take 1 tablet (7.5 mg total) by mouth daily. 04/17/17   Cathie Hoops, Jakyla Reza V, PA-C  metoprolol succinate (TOPROL-XL) 25 MG 24 hr tablet Take 25 mg by mouth daily.    [provider]  Omega-3 Fatty Acids (FISH OIL) 1200 MG CPDR Take 1,200 mg by mouth at bedtime.     [provider]  predniSONE (DELTASONE) 20 MG tablet Take 2 tablets (40 mg total) by mouth daily for 5 days. 04/17/17 04/22/17  Cathie Hoops, Dayanis Bergquist V, PA-C  rizatriptan (MAXALT-MLT) 10 MG disintegrating tablet Take 1 tablet (10 mg total) by mouth 3 (three) times daily as needed for migraine. May repeat in 2 hours if needed 08/06/16   Bing Neighbors, FNP  valACYclovir (VALTREX) 500 MG tablet Take 1 tablet (500 mg total) by mouth 2 (two) times daily. 02/05/17   Massie Maroon, FNP    Family History Family History  Problem Relation Age of Onset  . Coronary artery disease Mother 78  . Stroke Mother   . Migraines Mother   . Migraines Sister   . Other Unknown         There is also history of diabetes, stroke, and cancer in the family    Social History Social History   Tobacco Use  . Smoking status: Never Smoker  . Smokeless tobacco: Never Used  Substance Use Topics  . Alcohol use: No    Comment: occasional  . Drug use: No     Allergies   Amoxicillin; Bee venom; Imitrex [sumatriptan]; Tramadol; and Vicodin [hydrocodone-acetaminophen]   Review of Systems Review of Systems  Reason unable to perform ROS: See HPI as above.     Physical Exam Triage Vital Signs ED Triage Vitals  Enc Vitals Group     BP 04/17/17 1209 (!) 180/110     Pulse Rate 04/17/17 1209 (!) 46     Resp 04/17/17 1209 16     Temp 04/17/17 1209 98.7 F (37.1 C)     Temp Source 04/17/17 1209 Oral     SpO2 04/17/17 1209 97 %     Weight --      Height --      Head Circumference --      Peak Flow --      Pain Score  04/17/17 1214 10     Pain Loc --      Pain Edu? --      Excl. in GC? --    No data found.  Updated Vital Signs BP (!) 180/110 (BP Location: Right Arm)   Pulse (!) 46   Temp 98.7 F (37.1 C) (Oral)   Resp 16   LMP 08/07/2010 (Approximate)  Comment: Patient declined pregnancy test  SpO2 97%   Physical Exam  Constitutional: She is oriented to person, place, and time. She appears well-developed and well-nourished. No distress.  HENT:  Head: Normocephalic and atraumatic.  Right Ear: External ear and ear canal normal. Tympanic membrane is erythematous. Tympanic membrane is not bulging.  Left Ear: External ear and ear canal normal. Tympanic membrane is erythematous. Tympanic membrane is not bulging.  Nose: Mucosal edema and rhinorrhea present. Right sinus exhibits maxillary sinus tenderness and frontal sinus tenderness. Left sinus exhibits maxillary sinus tenderness and frontal sinus tenderness.  Mouth/Throat: Uvula is midline and mucous membranes are normal. Posterior oropharyngeal erythema present. No tonsillar exudate.  Eyes: Conjunctivae are normal. Pupils are equal, round, and reactive to light.  Neck: Normal range of motion. Neck supple.  Cardiovascular: Regular rhythm and normal heart sounds. Bradycardia present. Exam reveals no gallop and no friction rub.  No murmur heard. Pulmonary/Chest: Effort normal and breath sounds normal. She has no decreased breath sounds. She has no wheezes. She has no rhonchi. She has no rales.  Lymphadenopathy:    She has no cervical adenopathy.  Neurological: She is alert and oriented to person, place, and time.  Skin: Skin is warm and dry.  Psychiatric: She has a normal mood and affect. Her behavior is normal. Judgment normal.    UC Treatments / Results  Labs (all labs ordered are listed, but only abnormal results are displayed) Labs Reviewed - No data to display  EKG  EKG Interpretation None       Radiology No results  found.  Procedures Procedures (including critical care time)  Medications Ordered in UC Medications  ketorolac (TORADOL) 30 MG/ML injection 30 mg (30 mg Intramuscular Given 04/17/17 1247)  dexamethasone (DECADRON) injection 10 mg (10 mg Intramuscular Given 04/17/17 1244)     Initial Impression / Assessment and Plan / UC Course  I have reviewed the triage vital signs and the nursing notes.  Pertinent labs & imaging results that were available during my care of the patient were reviewed by me and considered in my medical decision making (see chart for details).    Pyrtle on Decadron injection in office today. Discussed with patient history and exam most consistent with viral URI.  Prednisone as directed.  Mobic for body aches.  Symptomatic treatment as needed. Push fluids. Return precautions given.   Patient with elevated blood pressure today, on multiple OTC cold medications.  Previously she has had controlled hypertension, not on any medications.  Will have patient monitor closely, follow-up with cardiologist as scheduled in 1 week for recheck.  Return precautions given.  Patient expresses understanding and agrees to plan.   Final Clinical Impressions(s) / UC Diagnoses   Final diagnoses:  Viral illness    ED Discharge Orders        Ordered    benzonatate (TESSALON) 100 MG capsule  Every 8 hours     04/17/17 1229    ipratropium (ATROVENT) 0.06 % nasal spray  4 times daily     04/17/17 1229    predniSONE (DELTASONE) 20 MG tablet  Daily     04/17/17 1229    meloxicam (MOBIC) 7.5 MG tablet  Daily     04/17/17 1229        Belinda Fisher, PA-C 04/17/17 2028

## 2017-04-17 NOTE — ED Triage Notes (Signed)
Pt here for sinus congestion, head pressure. Reports that she is also having chest congestion and coughing. Pain in chest and back with coughing. Symptoms have been present since yesterday. Using mucinex and Flonase for her symptoms.

## 2017-04-17 NOTE — Discharge Instructions (Signed)
Toradol, decadron injection in office today. Tessalon for cough. Prednisone for sinus pressure. Mobic for pain/body aches. Continue flonase, start atrovent nasal spray for nasal congestion/drainage. You can use over the counter nasal saline rinse such as neti pot for nasal congestion. Keep hydrated, your urine should be clear to pale yellow in color. Tylenol/motrin for fever and pain. Monitor for any worsening of symptoms, chest pain, shortness of breath, wheezing, swelling of the throat, follow up for reevaluation.   For sore throat try using a honey-based tea. Use 3 teaspoons of honey with juice squeezed from half lemon. Place shaved pieces of ginger into 1/2-1 cup of water and warm over stove top. Then mix the ingredients and repeat every 4 hours as needed.  As discussed, your blood pressure was slightly elevated today.  Your readings have been normal during other visits, this could be caused by the cold medications you have been taking.  However, please monitor your blood pressure daily, follow-up with your PCP or cardiologist for further evaluation and treatment if your blood pressure remains elevated.

## 2017-04-18 ENCOUNTER — Encounter (HOSPITAL_COMMUNITY): Payer: Self-pay | Admitting: Emergency Medicine

## 2017-04-18 ENCOUNTER — Emergency Department (HOSPITAL_COMMUNITY)
Admission: EM | Admit: 2017-04-18 | Discharge: 2017-04-18 | Disposition: A | Discharge status: Home / Self Care | Payer: BLUE CROSS/BLUE SHIELD | Attending: Emergency Medicine | Admitting: Emergency Medicine

## 2017-04-18 DIAGNOSIS — Z5321 Procedure and treatment not carried out due to patient leaving prior to being seen by health care provider: Secondary | ICD-10-CM | POA: Insufficient documentation

## 2017-04-18 DIAGNOSIS — R05 Cough: Secondary | ICD-10-CM | POA: Diagnosis not present

## 2017-04-18 DIAGNOSIS — R51 Headache: Secondary | ICD-10-CM | POA: Insufficient documentation

## 2017-04-18 NOTE — ED Triage Notes (Signed)
Pt reports headache and cough[facility] and diagnoses with a URI. States some blurry vision and eye pain. Reports taking OTC migraine medicine with no relief.

## 2017-05-07 ENCOUNTER — Ambulatory Visit (INDEPENDENT_AMBULATORY_CARE_PROVIDER_SITE_OTHER): Payer: BLUE CROSS/BLUE SHIELD | Admitting: Family Medicine

## 2017-05-07 ENCOUNTER — Encounter: Payer: Self-pay | Admitting: Family Medicine

## 2017-05-07 VITALS — BP 142/52 | HR 48 | Temp 98.3°F | Resp 16 | Ht 63.0 in | Wt 236.0 lb

## 2017-05-07 DIAGNOSIS — J3089 Other allergic rhinitis: Secondary | ICD-10-CM | POA: Diagnosis not present

## 2017-05-07 DIAGNOSIS — R5383 Other fatigue: Secondary | ICD-10-CM

## 2017-05-07 DIAGNOSIS — R058 Other specified cough: Secondary | ICD-10-CM

## 2017-05-07 DIAGNOSIS — J0100 Acute maxillary sinusitis, unspecified: Secondary | ICD-10-CM

## 2017-05-07 DIAGNOSIS — R05 Cough: Secondary | ICD-10-CM | POA: Diagnosis not present

## 2017-05-07 DIAGNOSIS — H938X3 Other specified disorders of ear, bilateral: Secondary | ICD-10-CM | POA: Diagnosis not present

## 2017-05-07 MED ORDER — DOXYCYCLINE HYCLATE 100 MG PO TABS: 100.0000 mg | ORAL_TABLET | Freq: Two times a day (BID) | ORAL | 0 refills | Status: DC

## 2017-05-07 MED ORDER — CETIRIZINE HCL 10 MG PO TABS: 10.0000 mg | ORAL_TABLET | Freq: Every day | ORAL | 11 refills | Status: DC

## 2017-05-07 MED ORDER — GUAIFENESIN 100 MG/5ML PO SOLN: 5.0000 mL | ORAL | 0 refills | Status: DC | PRN

## 2017-05-07 MED ORDER — SALINE 0.9 % NA AERS: 1.0000 | INHALATION_SPRAY | Freq: Two times a day (BID) | NASAL | 0 refills | Status: DC

## 2017-05-07 NOTE — Patient Instructions (Signed)
I suspect that you have an acute maxillary sinusitis.  Due to your allergy to amoxicillin, will start doxycycline 100 mg twice daily for 10 days.  Please take antibiotic in its entirety.  For productive cough, start guaifenesin 5 mL every 4-6 hours as needed.  Please increase your fluid intake while on these medications. Also increase your rest and handwashing.   Please follow-up as needed.  If you have any problems with this medication regimen, please notify clinical report to the emergency department. Sinusitis, Adult Sinusitis is soreness and inflammation of your sinuses. Sinuses are hollow spaces in the bones around your face. They are located:  Around your eyes.  In the middle of your forehead.  Behind your nose.  In your cheekbones.  Your sinuses and nasal passages are lined with a stringy fluid (mucus). Mucus normally drains out of your sinuses. When your nasal tissues get inflamed or swollen, the mucus can get trapped or blocked so air cannot flow through your sinuses. This lets bacteria, viruses, and funguses grow, and that leads to infection. Follow these instructions at home: Medicines  Take, use, or apply over-the-counter and prescription medicines only as told by your doctor. These may include nasal sprays.  If you were prescribed an antibiotic medicine, take it as told by your doctor. Do not stop taking the antibiotic even if you start to feel better. Hydrate and Humidify  Drink enough water to keep your pee (urine) clear or pale yellow.  Use a cool mist humidifier to keep the humidity level in your home above 50%.  Breathe in steam for 10-15 minutes, 3-4 times a day or as told by your doctor. You can do this in the bathroom while a hot shower is running.  Try not to spend time in cool or dry air. Rest  Rest as much as possible.  Sleep with your head raised (elevated).  Make sure to get enough sleep each night. General instructions  Put a warm, moist washcloth  on your face 3-4 times a day or as told by your doctor. This will help with discomfort.  Wash your hands often with soap and water. If there is no soap and water, use hand sanitizer.  Do not smoke. Avoid being around people who are smoking (secondhand smoke).  Keep all follow-up visits as told by your doctor. This is important. Contact a doctor if:  You have a fever.  Your symptoms get worse.  Your symptoms do not get better within 10 days. Get help right away if:  You have a very bad headache.  You cannot stop throwing up (vomiting).  You have pain or swelling around your face or eyes.  You have trouble seeing.  You feel confused.  Your neck is stiff.  You have trouble breathing. This information is not intended to replace advice given to you by your health care provider. Make sure you discuss any questions you have with your health care provider. Document Released: 07/15/2007 Document Revised: 09/22/2015 Document Reviewed: 11/21/2014 Elsevier Interactive Patient Education  [employer]2018 Elsevier Inc.

## 2017-05-07 NOTE — Progress Notes (Signed)
Subjective:  Pamela [name] is a 47 year old female with a history of hypertension presents for evaluation of possible sinus infection.  Patient states that symptoms have been present since April 17, 2017. She was evaluated at urgent care for sinus pain, sinus pressure, ear pain, nasal congestion and cough. Patient was prescribed oral steroids. She says that symptoms have worsened over the past several weeks. She has been taking OTC cold medications without relief. She denies fever or headache.   Past Medical History:  Diagnosis Date  . Chest pain   . Degenerative joint disease   . Herpes simplex   . Hx of echocardiogram 2012   normal  . Hypertension   . Migraine   . Migraine without aura, with intractable migraine, so stated, without mention of status migrainosus 07/26/2013  . Normal cardiac stress test 2012  . Obesity   . Osteoarthritis of knee    Social History   Socioeconomic History  . Marital status: Legally Separated    Spouse name: Not on file  . Number of children: 4  . Years of education: college AS  . Highest education level: Not on file  Occupational History  . Occupation: Magazine features editor: [employer]    Employer: [employer]  Social Needs  . Financial resource strain: Not on file  . Food insecurity:    Worry: Not on file    Inability: Not on file  . Transportation needs:    Medical: Not on file    Non-medical: Not on file  Tobacco Use  . Smoking status: Never Smoker  . Smokeless tobacco: Never Used  Substance and Sexual Activity  . Alcohol use: No    Comment: occasional  . Drug use: No  . Sexual activity: Yes    Birth control/protection: None  Lifestyle  . Physical activity:    Days per week: Not on file    Minutes per session: Not on file  . Stress: Not on file  Relationships  . Social connections:    Talks on phone: Not on file    Gets together: Not on file    Attends religious service: Not on file    Active member of  club or organization: Not on file    Attends meetings of clubs or organizations: Not on file    Relationship status: Not on file  . Intimate partner violence:    Fear of current or ex partner: Not on file    Emotionally abused: Not on file    Physically abused: Not on file    Forced sexual activity: Not on file  Other Topics Concern  . Not on file  Social History Narrative  . Not on file  Review of Systems  Constitutional: Negative.  Negative for chills and fever.  HENT: Positive for congestion, sinus pain and sore throat.   Respiratory: Negative.   Cardiovascular: Negative.   Genitourinary: Positive for dysuria.  Musculoskeletal: Negative.   Skin: Negative.   Neurological: Positive for headaches.  Endo/Heme/Allergies: Positive for environmental allergies.     Objective:  Physical Exam  Constitutional: She is oriented to person, place, and time.  HENT:  Right Ear: Tympanic membrane is erythematous.  Left Ear: Tympanic membrane is erythematous.  Nose: Mucosal edema present. Right sinus exhibits maxillary sinus tenderness. Left sinus exhibits maxillary sinus tenderness.  Mouth/Throat: Oropharyngeal exudate present.  Cardiovascular: Normal rate, regular rhythm and normal heart sounds.  Pulmonary/Chest: Effort normal and breath sounds normal.  Abdominal: Soft. Bowel  sounds are normal.  Neurological: She is alert and oriented to person, place, and time.     Assessment:    Acute bacterial sinusitis    Plan:  1. Acute non-recurrent maxillary sinusitis .  A trial of doxycycline 100 mg twice daily for 10 days. We will also resume cetirizine 10 mg at bedtime. Advised to refrain from using over-the-counter decongestants due to history of hypertension, patient expressed understanding. - doxycycline (VIBRA-TABS) 100 MG tablet; Take 1 tablet (100 mg total) by mouth 2 (two) times daily.  Dispense: 20 tablet; Refill: 0 - Saline 0.9 % AERS; Place 1 each into the nose 2 (two) times  daily for 7 days.  Dispense: 1 Can; Refill: 0  2. Cough productive of yellow sputum - guaiFENesin (ROBITUSSIN) 100 MG/5ML SOLN; Take 5 mLs (100 mg total) by mouth every 4 (four) hours as needed for cough or to loosen phlegm.  Dispense: 1200 mL; Refill: 0  3. Pressure sensation in both ears - doxycycline (VIBRA-TABS) 100 MG tablet; Take 1 tablet (100 mg total) by mouth 2 (two) times daily.  Dispense: 20 tablet; Refill: 0 - cetirizine (ZYRTEC) 10 MG tablet; Take 1 tablet (10 mg total) by mouth daily.  Dispense: 30 tablet; Refill: 11  4. Other fatigue Rest, handwashing, and fluid intake  5. Allergic rhinitis due to other allergic trigger, unspecified seasonality - cetirizine (ZYRTEC) 10 MG tablet; Take 1 tablet (10 mg total) by mouth daily.  Dispense: 30 tablet; Refill: 11  RTC: Return to clinic as previously scheduled   Nolon NationsLachina Moore Hollis  MSN, FNP-C [facility] [address]  [place], [place] [address] [phone]

## 2017-07-21 ENCOUNTER — Other Ambulatory Visit: Payer: Self-pay | Admitting: Family Medicine

## 2017-07-21 DIAGNOSIS — F329 Major depressive disorder, single episode, unspecified: Secondary | ICD-10-CM

## 2017-07-21 NOTE — Progress Notes (Signed)
Orders Placed This Encounter  Procedures  . Ambulatory referral to Psychology    Referral Priority:   Routine    Referral Type:   Psychiatric    Referral Reason:   Specialty Services Required    Requested Specialty:   Psychology    Number of Visits Requested:   1     Ashly Yepez Rennis PettyMoore Maythe Deramo  MSN, FNP-C [facility] [address]  [place], [place] [address] [phone]

## 2017-07-29 ENCOUNTER — Ambulatory Visit: Payer: BLUE CROSS/BLUE SHIELD | Admitting: Psychology

## 2017-07-29 DIAGNOSIS — F332 Major depressive disorder, recurrent severe without psychotic features: Secondary | ICD-10-CM

## 2017-08-02 ENCOUNTER — Ambulatory Visit: Payer: BLUE CROSS/BLUE SHIELD | Admitting: Psychology

## 2017-08-02 DIAGNOSIS — F332 Major depressive disorder, recurrent severe without psychotic features: Secondary | ICD-10-CM | POA: Diagnosis not present

## 2017-08-06 ENCOUNTER — Ambulatory Visit: Payer: BLUE CROSS/BLUE SHIELD | Admitting: Family Medicine

## 2017-08-11 ENCOUNTER — Ambulatory Visit: Payer: BLUE CROSS/BLUE SHIELD | Admitting: Psychology

## 2017-08-11 DIAGNOSIS — F332 Major depressive disorder, recurrent severe without psychotic features: Secondary | ICD-10-CM | POA: Diagnosis not present

## 2017-08-18 ENCOUNTER — Other Ambulatory Visit: Payer: Self-pay | Admitting: Family Medicine

## 2017-08-27 ENCOUNTER — Ambulatory Visit: Payer: BLUE CROSS/BLUE SHIELD | Admitting: Psychology

## 2017-09-07 ENCOUNTER — Ambulatory Visit: Payer: BLUE CROSS/BLUE SHIELD | Admitting: Psychology

## 2017-09-08 ENCOUNTER — Ambulatory Visit: Payer: BLUE CROSS/BLUE SHIELD | Admitting: Psychology

## 2017-09-08 DIAGNOSIS — F332 Major depressive disorder, recurrent severe without psychotic features: Secondary | ICD-10-CM

## 2017-09-10 ENCOUNTER — Ambulatory Visit: Payer: Self-pay | Admitting: Family Medicine

## 2017-09-14 ENCOUNTER — Ambulatory Visit: Payer: BLUE CROSS/BLUE SHIELD | Admitting: Psychology

## 2017-09-14 DIAGNOSIS — F332 Major depressive disorder, recurrent severe without psychotic features: Secondary | ICD-10-CM

## 2017-09-17 ENCOUNTER — Ambulatory Visit (INDEPENDENT_AMBULATORY_CARE_PROVIDER_SITE_OTHER): Payer: BLUE CROSS/BLUE SHIELD | Admitting: Family Medicine

## 2017-09-17 ENCOUNTER — Encounter: Payer: Self-pay | Admitting: Family Medicine

## 2017-09-17 VITALS — BP 147/66 | HR 49 | Temp 98.0°F | Resp 16 | Ht 63.0 in | Wt 239.0 lb

## 2017-09-17 DIAGNOSIS — I10 Essential (primary) hypertension: Secondary | ICD-10-CM | POA: Diagnosis not present

## 2017-09-17 DIAGNOSIS — G43809 Other migraine, not intractable, without status migrainosus: Secondary | ICD-10-CM

## 2017-09-17 DIAGNOSIS — E559 Vitamin D deficiency, unspecified: Secondary | ICD-10-CM | POA: Diagnosis not present

## 2017-09-17 DIAGNOSIS — J3089 Other allergic rhinitis: Secondary | ICD-10-CM | POA: Diagnosis not present

## 2017-09-17 DIAGNOSIS — J329 Chronic sinusitis, unspecified: Secondary | ICD-10-CM

## 2017-09-17 DIAGNOSIS — H938X3 Other specified disorders of ear, bilateral: Secondary | ICD-10-CM

## 2017-09-17 DIAGNOSIS — R001 Bradycardia, unspecified: Secondary | ICD-10-CM

## 2017-09-17 LAB — POCT URINALYSIS DIPSTICK
Bilirubin, UA: NEGATIVE
Blood, UA: NEGATIVE
Glucose, UA: NEGATIVE
Ketones, UA: NEGATIVE
Leukocytes, UA: NEGATIVE
Nitrite, UA: NEGATIVE
Protein, UA: POSITIVE — AB
Spec Grav, UA: >=1.03 — AB (ref 1.010–1.025)
Urobilinogen, UA: 0.2 E.U./dL
pH, UA: 5 (ref 5.0–8.0)

## 2017-09-17 MED ORDER — FLUTICASONE PROPIONATE 50 MCG/ACT NA SUSP: 2.0000 | Freq: Every day | NASAL | 6 refills | Status: AC

## 2017-09-17 MED ORDER — RIZATRIPTAN BENZOATE 10 MG PO TBDP: 10.0000 mg | ORAL_TABLET | Freq: Three times a day (TID) | ORAL | 3 refills | Status: DC | PRN

## 2017-09-17 MED ORDER — CETIRIZINE HCL 10 MG PO TABS: 10.0000 mg | ORAL_TABLET | Freq: Every day | ORAL | 11 refills | Status: DC

## 2017-09-17 MED ORDER — SULFAMETHOXAZOLE-TRIMETHOPRIM 800-160 MG PO TABS: 1.0000 | ORAL_TABLET | Freq: Two times a day (BID) | ORAL | 0 refills | Status: AC

## 2017-09-17 NOTE — Patient Instructions (Addendum)
Hypertension Hypertension is another name for high blood pressure. High blood pressure forces your heart to work harder to pump blood. This can cause problems over time. There are two numbers in a blood pressure reading. There is a top number (systolic) over a bottom number (diastolic). It is best to have a blood pressure below 120/80. Healthy choices can help lower your blood pressure. You may need medicine to help lower your blood pressure if:  Your blood pressure cannot be lowered with healthy choices.  Your blood pressure is higher than 130/80.  Follow these instructions at home: Eating and drinking  If directed, follow the DASH eating plan. This diet includes: ? Filling half of your plate at each meal with fruits and vegetables. ? Filling one quarter of your plate at each meal with whole grains. Whole grains include whole wheat pasta, brown rice, and whole grain bread. ? Eating or drinking low-fat dairy products, such as skim milk or low-fat yogurt. ? Filling one quarter of your plate at each meal with low-fat (lean) proteins. Low-fat proteins include fish, skinless chicken, eggs, beans, and tofu. ? Avoiding fatty meat, cured and processed meat, or chicken with skin. ? Avoiding premade or processed food.  Eat less than 1,500 mg of salt (sodium) a day.  Limit alcohol use to no more than 1 drink a day for nonpregnant women and 2 drinks a day for men. One drink equals 12 oz of beer, 5 oz of wine, or 1 oz of hard liquor. Lifestyle  Work with your doctor to stay at a healthy weight or to lose weight. Ask your doctor what the best weight is for you.  Get at least 30 minutes of exercise that causes your heart to beat faster (aerobic exercise) most days of the week. This may include walking, swimming, or biking.  Get at least 30 minutes of exercise that strengthens your muscles (resistance exercise) at least 3 days a week. This may include lifting weights or pilates.  Do not use any  products that contain nicotine or tobacco. This includes cigarettes and e-cigarettes. If you need help quitting, ask your doctor.  Check your blood pressure at home as told by your doctor.  Keep all follow-up visits as told by your doctor. This is important. Medicines  Take over-the-counter and prescription medicines only as told by your doctor. Follow directions carefully.  Do not skip doses of blood pressure medicine. The medicine does not work as well if you skip doses. Skipping doses also puts you at risk for problems.  Ask your doctor about side effects or reactions to medicines that you should watch for. Contact a doctor if:  You think you are having a reaction to the medicine you are taking.  You have headaches that keep coming back (recurring).  You feel dizzy.  You have swelling in your ankles.  You have trouble with your vision. Get help right away if:  You get a very bad headache.  You start to feel confused.  You feel weak or numb.  You feel faint.  You get very bad pain in your: ? Chest. ? Belly (abdomen).  You throw up (vomit) more than once.  You have trouble breathing. Summary  Hypertension is another name for high blood pressure.  Making healthy choices can help lower blood pressure. If your blood pressure cannot be controlled with healthy choices, you may need to take medicine. This information is not intended to replace advice given to you by your health care   provider. Make sure you discuss any questions you have with your health care provider. Document Released: 07/15/2007 Document Revised: 12/25/2015 Document Reviewed: 12/25/2015 Elsevier Interactive Patient Education  2018 [employer] Migraine Headache A migraine headache is a very strong throbbing pain on one side or both sides of your head. Migraines can also cause other symptoms. Talk with your doctor about what things may bring on (trigger) your migraine headaches. Follow these  instructions at home: Medicines  Take over-the-counter and prescription medicines only as told by your doctor.  Do not drive or use heavy machinery while taking prescription pain medicine.  To prevent or treat constipation while you are taking prescription pain medicine, your doctor may recommend that you: ? Drink enough fluid to keep your pee (urine) clear or pale yellow. ? Take over-the-counter or prescription medicines. ? Eat foods that are high in fiber. These include fresh fruits and vegetables, whole grains, and beans. ? Limit foods that are high in fat and processed sugars. These include fried and sweet foods. Lifestyle  Avoid alcohol.  Do not use any products that contain nicotine or tobacco, such as cigarettes and e-cigarettes. If you need help quitting, ask your doctor.  Get at least 8 hours of sleep every night.  Limit your stress. General instructions   Keep a journal to find out what may bring on your migraines. For example, write down: ? What you eat and drink. ? How much sleep you get. ? Any change in what you eat or drink. ? Any change in your medicines.  If you have a migraine: ? Avoid things that make your symptoms worse, such as bright lights. ? It may help to lie down in a dark, quiet room. ? Do not drive or use heavy machinery. ? Ask your doctor what activities are safe for you.  Keep all follow-up visits as told by your doctor. This is important. Contact a doctor if:  You get a migraine that is different or worse than your usual migraines. Get help right away if:  Your migraine gets very bad.  You have a fever.  You have a stiff neck.  You have trouble seeing.  Your muscles feel weak or like you cannot control them.  You start to lose your balance a lot.  You start to have trouble walking.  You pass out (faint). This information is not intended to replace advice given to you by your health care provider. Make sure you discuss any  questions you have with your health care provider. Document Released: 11/05/2007 Document Revised: 08/16/2015 Document Reviewed: 07/15/2015 Elsevier Interactive Patient Education  2018 [employer] Sinusitis, Adult Sinusitis is soreness and inflammation of your sinuses. Sinuses are hollow spaces in the bones around your face. They are located:  Around your eyes.  In the middle of your forehead.  Behind your nose.  In your cheekbones.  Your sinuses and nasal passages are lined with a stringy fluid (mucus). Mucus normally drains out of your sinuses. When your nasal tissues get inflamed or swollen, the mucus can get trapped or blocked so air cannot flow through your sinuses. This lets bacteria, viruses, and funguses grow, and that leads to infection. Follow these instructions at home: Medicines  Take, use, or apply over-the-counter and prescription medicines only as told by your doctor. These may include nasal sprays.  If you were prescribed an antibiotic medicine, take it as told by your doctor. Do not stop taking the antibiotic even if you start to feel better.  Hydrate and Humidify  Drink enough water to keep your pee (urine) clear or pale yellow.  Use a cool mist humidifier to keep the humidity level in your home above 50%.  Breathe in steam for 10-15 minutes, 3-4 times a day or as told by your doctor. You can do this in the bathroom while a hot shower is running.  Try not to spend time in cool or dry air. Rest  Rest as much as possible.  Sleep with your head raised (elevated).  Make sure to get enough sleep each night. General instructions  Put a warm, moist washcloth on your face 3-4 times a day or as told by your doctor. This will help with discomfort.  Wash your hands often with soap and water. If there is no soap and water, use hand sanitizer.  Do not smoke. Avoid being around people who are smoking (secondhand smoke).  Keep all follow-up visits as told by your  doctor. This is important. Contact a doctor if:  You have a fever.  Your symptoms get worse.  Your symptoms do not get better within 10 days. Get help right away if:  You have a very bad headache.  You cannot stop throwing up (vomiting).  You have pain or swelling around your face or eyes.  You have trouble seeing.  You feel confused.  Your neck is stiff.  You have trouble breathing. This information is not intended to replace advice given to you by your health care provider. Make sure you discuss any questions you have with your health care provider. Document Released: 07/15/2007 Document Revised: 09/22/2015 Document Reviewed: 11/21/2014 Elsevier Interactive Patient Education  [employer].

## 2017-09-17 NOTE — Progress Notes (Signed)
Subjective:  Pamela [name] is a 47 y.o. female with hypertension. Current Outpatient Medications  Medication Sig Dispense Refill  . cetirizine (ZYRTEC) 10 MG tablet Take 1 tablet (10 mg total) by mouth daily. 30 tablet 11  . Cholecalciferol (VITAMIN D3) 5000 UNITS TABS Take 5,000 Units by mouth daily.     Marland Kitchen gabapentin (NEURONTIN) 300 MG capsule TAKE ONE CAPSULE BY MOUTH FOUR TIMES DAILY 120 capsule 0  . Omega-3 Fatty Acids (FISH OIL) 1200 MG CPDR Take 1,200 mg by mouth at bedtime.     Marland Kitchen acyclovir ointment (ZOVIRAX) 5 % Apply 1 application topically every 3 (three) hours. (Patient not taking: Reported on 05/07/2017) 15 g 1  . Ascorbic Acid (VITAMIN C PO) Take 1,000 mg by mouth daily.     . Cyanocobalamin (VITAMIN B 12 PO) Take 1 tablet by mouth daily.    Marland Kitchen diltiazem (CARDIZEM) 30 MG tablet Take 30 mg by mouth 4 (four) times daily.    . Echinacea 80 MG CAPS Take by mouth.    . fluticasone (FLONASE) 50 MCG/ACT nasal spray Place 2 sprays into both nostrils daily. 16 g 6  . hydrALAZINE (APRESOLINE) 25 MG tablet Take 25 mg by mouth 2 (two) times daily.    Marland Kitchen ipratropium (ATROVENT) 0.06 % nasal spray Place 2 sprays into both nostrils 4 (four) times daily. (Patient not taking: Reported on 05/07/2017) 15 mL 0  . lidocaine (LIDODERM) 5 % Place 1 patch onto the skin daily. Remove & Discard patch within 12 hours or as directed by MD (Patient not taking: Reported on 02/05/2017) 30 patch 0  . Magnesium 500 MG TABS Take 2,500 mg by mouth daily.    . metoprolol succinate (TOPROL-XL) 25 MG 24 hr tablet Take 25 mg by mouth daily.    . rizatriptan (MAXALT-MLT) 10 MG disintegrating tablet Take 1 tablet (10 mg total) by mouth 3 (three) times daily as needed for migraine. May repeat in 2 hours if needed 10 tablet 3  . Saline 0.9 % AERS Place 1 each into the nose 2 (two) times daily for 7 days. 1 Can 0  . sulfamethoxazole-trimethoprim (BACTRIM DS,SEPTRA DS) 800-160 MG tablet Take 1 tablet by mouth 2 (two) times daily  for 7 days. 14 tablet 0  . valACYclovir (VALTREX) 500 MG tablet Take 1 tablet (500 mg total) by mouth 2 (two) times daily. (Patient not taking: Reported on 05/07/2017) 60 tablet 11   No current facility-administered medications for this visit.     Hypertension ROS: not taking medications regularly as instructed, no medication side effects noted, patient does not perform home BP monitoring, no TIA's, possible neurological symptoms headaches, no chest pain on exertion, no dyspnea on exertion, no swelling of ankles, no orthostatic dizziness or lightheadedness and no palpitations.  New concerns: Patient states that she is having daily headaches. Was previously referred to neurology. Patient states that she was prescribed a medication and did not take it. Patient states that she has sinus pain and pressure x 2 months. Patient with allergic rhinitis. Has Flonase and zyrtec but not taking consistently. Has not used recently. Hx of migraine and refuses to take medications for them.  Patient states that she is under stress. Also states that she needs a tooth removed. States that she was told that a tooth with a root "wrapped around it". Has not had insurance to get the tooth removed.   Objective:  BP (!) 147/66 (BP Location: Right Arm, Patient Position: Sitting, Cuff Size: Large)  Pulse (!) 49   Temp 98 F (36.7 C) (Oral)   Resp 16   Ht 5\' 3"  (1.6 m)   Wt 239 lb (108.4 kg)   LMP 08/07/2010 (Approximate) Comment: Patient declined pregnancy test  SpO2 99%   BMI 42.34 kg/m   Appearance alert, well appearing, and in no distress, overweight and well hydrated. General exam BP noted to be well controlled today in office, S1, S2 normal, no gallop, no murmur, chest clear, no JVD, no HSM, no edema.  Lab review: orders written for new lab studies as appropriate; see orders.   Assessment:   Hypertension stable, no significant medication side effects noted, needs improvement, needs to follow diet more regularly  and exercise daily. .   Plan:  Orders and follow up as documented in patient record. Reviewed diet, exercise and weight control. Recommended sodium restriction. The following changes are to be made: take medications daily. Follow up: 3 months and as needed..  1. Essential hypertension The patient is asked to make an attempt to improve diet and exercise patterns to aid in medical management of this problem. - Urinalysis Dipstick - Microalbumin/Creatinine Ratio, Urine - Lipid Panel With LDL/HDL Ratio; Future - Comprehensive metabolic panel  2. Morbid obesity (HCC) The patient is asked to make an attempt to improve diet and exercise patterns to aid in medical management of this problem.   3. Vitamin D deficiency - Vitamin D, 25-hydroxy  4. Allergic rhinitis due to other allergic trigger, unspecified seasonality - cetirizine (ZYRTEC) 10 MG tablet; Take 1 tablet (10 mg total) by mouth daily.  Dispense: 30 tablet; Refill: 11 - fluticasone (FLONASE) 50 MCG/ACT nasal spray; Place 2 sprays into both nostrils daily.  Dispense: 16 g; Refill: 6  5. Pressure sensation in both ears - cetirizine (ZYRTEC) 10 MG tablet; Take 1 tablet (10 mg total) by mouth daily.  Dispense: 30 tablet; Refill: 11  6. Chronic sinusitis, unspecified location Nasal Lavage discussed. Daily use of flonase and antihistamine ordered.  - sulfamethoxazole-trimethoprim (BACTRIM DS,SEPTRA DS) 800-160 MG tablet; Take 1 tablet by mouth 2 (two) times daily for 7 days.  Dispense: 14 tablet; Refill: 0  7. Bradycardia Patient is not taking metoprolol. Patient states that this is baseline.   8. Other migraine without status migrainosus, not intractable - rizatriptan (MAXALT-MLT) 10 MG disintegrating tablet; Take 1 tablet (10 mg total) by mouth 3 (three) times daily as needed for migraine. May repeat in 2 hours if needed  Dispense: 10 tablet; Refill: 3 - sulfamethoxazole-trimethoprim (BACTRIM DS,SEPTRA DS) 800-160 MG tablet; Take  1 tablet by mouth 2 (two) times daily for 7 days.  Dispense: 14 tablet; Refill: 0 - cetirizine (ZYRTEC) 10 MG tablet; Take 1 tablet (10 mg total) by mouth daily.  Dispense: 30 tablet; Refill: 11 - fluticasone (FLONASE) 50 MCG/ACT nasal spray; Place 2 sprays into both nostrils daily.  Dispense: 16 g; Refill: 6  Return to care as scheduled and prn. Patient verbalized understanding and agreed with plan of care.   Ms. Freda Jacksonndr L. Riley Lamouglas, FNP-BC [facility] [address]  [place], [place] [id number] [phone]

## 2017-09-18 LAB — MICROALBUMIN / CREATININE URINE RATIO
Creatinine, Urine: 143.6 mg/dL
Microalb/Creat Ratio: 178 mg/g creat — ABNORMAL HIGH (ref 0.0–30.0)
Microalbumin, Urine: 255.6 ug/mL

## 2017-09-18 LAB — COMPREHENSIVE METABOLIC PANEL
ALT: 21 IU/L (ref 0–32)
AST: 22 IU/L (ref 0–40)
Albumin/Globulin Ratio: 1.9 (ref 1.2–2.2)
Albumin: 4.6 g/dL (ref 3.5–5.5)
Alkaline Phosphatase: 95 IU/L (ref 39–117)
BUN/Creatinine Ratio: 24 — ABNORMAL HIGH (ref 9–23)
BUN: 14 mg/dL (ref 6–24)
Bilirubin Total: 0.4 mg/dL (ref 0.0–1.2)
CO2: 21 mmol/L (ref 20–29)
Calcium: 9.4 mg/dL (ref 8.7–10.2)
Chloride: 103 mmol/L (ref 96–106)
Creatinine, Ser: 0.58 mg/dL (ref 0.57–1.00)
GFR calc Af Amer: 129 mL/min/{1.73_m2} (ref >59)
GFR calc non Af Amer: 112 mL/min/{1.73_m2} (ref >59)
Globulin, Total: 2.4 g/dL (ref 1.5–4.5)
Glucose: 80 mg/dL (ref 65–99)
Potassium: 4.3 mmol/L (ref 3.5–5.2)
Sodium: 140 mmol/L (ref 134–144)
Total Protein: 7 g/dL (ref 6.0–8.5)

## 2017-09-18 LAB — VITAMIN D 25 HYDROXY (VIT D DEFICIENCY, FRACTURES): Vit D, 25-Hydroxy: 21.5 ng/mL — ABNORMAL LOW (ref 30.0–100.0)

## 2017-09-27 ENCOUNTER — Ambulatory Visit: Payer: BLUE CROSS/BLUE SHIELD | Admitting: Psychology

## 2017-09-27 DIAGNOSIS — F411 Generalized anxiety disorder: Secondary | ICD-10-CM | POA: Diagnosis not present

## 2017-12-31 ENCOUNTER — Emergency Department (HOSPITAL_COMMUNITY): Payer: BLUE CROSS/BLUE SHIELD

## 2017-12-31 ENCOUNTER — Emergency Department (HOSPITAL_COMMUNITY)
Admission: EM | Admit: 2017-12-31 | Discharge: 2018-01-01 | Disposition: A | Discharge status: Home / Self Care | Payer: BLUE CROSS/BLUE SHIELD | Attending: Emergency Medicine | Admitting: Emergency Medicine

## 2017-12-31 ENCOUNTER — Encounter (HOSPITAL_COMMUNITY): Payer: Self-pay | Admitting: Emergency Medicine

## 2017-12-31 ENCOUNTER — Other Ambulatory Visit: Payer: Self-pay

## 2017-12-31 DIAGNOSIS — N179 Acute kidney failure, unspecified: Secondary | ICD-10-CM | POA: Insufficient documentation

## 2017-12-31 DIAGNOSIS — I10 Essential (primary) hypertension: Secondary | ICD-10-CM | POA: Insufficient documentation

## 2017-12-31 DIAGNOSIS — R072 Precordial pain: Secondary | ICD-10-CM | POA: Insufficient documentation

## 2017-12-31 DIAGNOSIS — Z79899 Other long term (current) drug therapy: Secondary | ICD-10-CM | POA: Insufficient documentation

## 2017-12-31 LAB — BASIC METABOLIC PANEL
Anion gap: 9 (ref 5–15)
BUN: 25 mg/dL — ABNORMAL HIGH (ref 6–20)
CALCIUM: 9.4 mg/dL (ref 8.9–10.3)
CHLORIDE: 107 mmol/L (ref 98–111)
CO2: 25 mmol/L (ref 22–32)
Creatinine, Ser: 1.28 mg/dL — ABNORMAL HIGH (ref 0.44–1.00)
GFR calc non Af Amer: 49 mL/min — ABNORMAL LOW (ref >60)
GFR, EST AFRICAN AMERICAN: 57 mL/min — AB (ref >60)
Glucose, Bld: 105 mg/dL — ABNORMAL HIGH (ref 70–99)
Potassium: 3.9 mmol/L (ref 3.5–5.1)
SODIUM: 141 mmol/L (ref 135–145)

## 2017-12-31 LAB — HEPATIC FUNCTION PANEL
ALBUMIN: 4.4 g/dL (ref 3.5–5.0)
ALT: 22 U/L (ref 0–44)
AST: 25 U/L (ref 15–41)
Alkaline Phosphatase: 94 U/L (ref 38–126)
Bilirubin, Direct: 0.1 mg/dL (ref 0.0–0.2)
Indirect Bilirubin: 0.3 mg/dL (ref 0.3–0.9)
TOTAL PROTEIN: 7.7 g/dL (ref 6.5–8.1)
Total Bilirubin: 0.4 mg/dL (ref 0.3–1.2)

## 2017-12-31 LAB — CBC
HEMATOCRIT: 38.6 % (ref 36.0–46.0)
Hemoglobin: 12.6 g/dL (ref 12.0–15.0)
MCH: 27.8 pg (ref 26.0–34.0)
MCHC: 32.6 g/dL (ref 30.0–36.0)
MCV: 85.2 fL (ref 80.0–100.0)
NRBC: 0 % (ref 0.0–0.2)
PLATELETS: 209 10*3/uL (ref 150–400)
RBC: 4.53 MIL/uL (ref 3.87–5.11)
RDW: 13.2 % (ref 11.5–15.5)
WBC: 7.5 10*3/uL (ref 4.0–10.5)

## 2017-12-31 LAB — I-STAT BETA HCG BLOOD, ED (NOT ORDERABLE)

## 2017-12-31 LAB — LIPASE, BLOOD: Lipase: 32 U/L (ref 11–51)

## 2017-12-31 LAB — POCT I-STAT TROPONIN I: TROPONIN I, POC: 0 ng/mL (ref 0.00–0.08)

## 2017-12-31 LAB — D-DIMER, QUANTITATIVE: D-Dimer, Quant: 0.88 ug/mL-FEU — ABNORMAL HIGH (ref 0.00–0.50)

## 2017-12-31 MED ORDER — IOPAMIDOL (ISOVUE-370) INJECTION 76%
100.0000 mL | Freq: Once | INTRAVENOUS | Status: AC | PRN
  Administered 2017-12-31: 100 mL via INTRAVENOUS

## 2017-12-31 MED ORDER — SODIUM CHLORIDE (PF) 0.9 % IJ SOLN
INTRAMUSCULAR | Status: AC
  Filled 2017-12-31: qty 50

## 2017-12-31 MED ORDER — IOPAMIDOL (ISOVUE-370) INJECTION 76%
INTRAVENOUS | Status: AC
  Filled 2017-12-31: qty 100

## 2017-12-31 NOTE — ED Triage Notes (Signed)
Pt presents with onset of chest pain starting this afternoon. Patient states that pain is left chest, with radiation to arm and neck. Patient states pain went away and came back this evening. Patient states she took PO Cardizem at [id number] and it did not help.

## 2017-12-31 NOTE — ED Provider Notes (Signed)
[facility]-EMERGENCY DEPT Provider Note   CSN: [id number] Arrival date & time: 12/31/17  2017     History   Chief Complaint Chief Complaint  Patient presents with  . Chest Pain    HPI Pamela [name] is a 47 y.o. female.  The history is provided by the patient and medical records. No language interpreter was used.  Chest Pain   This is a recurrent problem. The current episode started 6 to 12 hours ago. The problem occurs constantly. The problem has not changed since onset.The pain is present in the lateral region. The pain is at a severity of 7/10. The pain is moderate. The quality of the pain is described as sharp. The pain radiates to the left arm. The symptoms are aggravated by certain positions. Associated symptoms include diaphoresis, irregular heartbeat, palpitations and shortness of breath. Pertinent negatives include no abdominal pain, no back pain, no cough, no exertional chest pressure, no fever, no headaches, no hemoptysis, no leg pain, no lower extremity edema, no malaise/fatigue, no nausea, no vomiting and no weakness. She has tried nothing for the symptoms. The treatment provided no relief.    Past Medical History:  Diagnosis Date  . Chest pain   . Degenerative joint disease   . Herpes simplex   . Hx of echocardiogram 2012   normal  . Hypertension   . Migraine   . Migraine without aura, with intractable migraine, so stated, without mention of status migrainosus 07/26/2013  . Normal cardiac stress test 2012  . Obesity   . Osteoarthritis of knee     Patient Active Problem List   Diagnosis Date Noted  . 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 Essential hypertension 02/21/2016  . Bradycardia 02/21/2016  . Hypertriglyceridemia 05/07/2015  . Murmur 11/10/2013  . Congenital heart disease 11/10/2013  . Migraine without aura, with intractable migraine, so stated, without  mention of status migrainosus 07/26/2013  . Pap smear for cervical cancer screening 09/29/2012  . Morbid obesity (HCC) 09/29/2012  . Polyphagia(783.6) 09/29/2012  . Chest pain 09/03/2012    Past Surgical History:  Procedure Laterality Date  . ASD REPAIR  1977  . CARDIOVASCULAR STRESS TEST     negative  . CARPAL TUNNEL RELEASE    . CESAREAN SECTION    . EXPLORATION POST OPERATIVE OPEN HEART    . KNEE SURGERY  2012   right     OB History   None      Home Medications    Prior to Admission medications   Medication Sig Start Date End Date Taking? Authorizing Provider  acyclovir ointment (ZOVIRAX) 5 % Apply 1 application topically every 3 (three) hours. Patient not taking: Reported on 05/07/2017 08/06/16   Bing Neighbors, FNP  Ascorbic Acid (VITAMIN C PO) Take 1,000 mg by mouth daily.     [provider]  cetirizine (ZYRTEC) 10 MG tablet Take 1 tablet (10 mg total) by mouth daily. 09/17/17   Mike Gip, FNP  Cholecalciferol (VITAMIN D3) 5000 UNITS TABS Take 5,000 Units by mouth daily.     [provider]  Cyanocobalamin (VITAMIN B 12 PO) Take 1 tablet by mouth daily.    [provider]  diltiazem (CARDIZEM) 30 MG tablet Take 30 mg by mouth 4 (four) times daily.    [provider]  Echinacea 80 MG CAPS Take by mouth.    [provider]  fluticasone Aleda Grana)  50 MCG/ACT nasal spray Place 2 sprays into both nostrils daily. 09/17/17   Mike Gipouglas, Andre, FNP  gabapentin (NEURONTIN) 300 MG capsule TAKE ONE CAPSULE BY MOUTH FOUR TIMES DAILY 04/05/17   Bing NeighborsHarris, Kimberly S, FNP  hydrALAZINE (APRESOLINE) 25 MG tablet Take 25 mg by mouth 2 (two) times daily.    Orpah CobbKadakia, Ajay, MD  ipratropium (ATROVENT) 0.06 % nasal spray Place 2 sprays into both nostrils 4 (four) times daily. Patient not taking: Reported on 05/07/2017 04/17/17   Belinda FisherYu, Amy V, PA-C  lidocaine (LIDODERM) 5 % Place 1 patch onto the skin daily. Remove & Discard patch within 12 hours or as  directed by MD Patient not taking: Reported on 02/05/2017 08/06/16   Bing NeighborsHarris, Kimberly S, FNP  Magnesium 500 MG TABS Take 2,500 mg by mouth daily.    [provider]  metoprolol succinate (TOPROL-XL) 25 MG 24 hr tablet Take 25 mg by mouth daily.    [provider]  Omega-3 Fatty Acids (FISH OIL) 1200 MG CPDR Take 1,200 mg by mouth at bedtime.     [provider]  rizatriptan (MAXALT-MLT) 10 MG disintegrating tablet Take 1 tablet (10 mg total) by mouth 3 (three) times daily as needed for migraine. May repeat in 2 hours if needed 09/17/17   Mike Gipouglas, Andre, FNP  Saline 0.9 % AERS Place 1 each into the nose 2 (two) times daily for 7 days. 05/07/17 05/14/17  Massie MaroonHollis, Lachina M, FNP  valACYclovir (VALTREX) 500 MG tablet Take 1 tablet (500 mg total) by mouth 2 (two) times daily. Patient not taking: Reported on 05/07/2017 02/05/17   Massie MaroonHollis, Lachina M, FNP    Family History Family History  Problem Relation Age of Onset  . Coronary artery disease Mother 2446  . Stroke Mother   . Migraines Mother   . Migraines Sister   . Other Unknown         There is also history of diabetes, stroke, and cancer in the family    Social History Social History   Tobacco Use  . Smoking status: Never Smoker  . Smokeless tobacco: Never Used  Substance Use Topics  . Alcohol use: No    Comment: occasional  . Drug use: No     Allergies   Amoxicillin; Bee venom; Imitrex [sumatriptan]; Tramadol; and Vicodin [hydrocodone-acetaminophen]   Review of Systems Review of Systems  Constitutional: Positive for diaphoresis. Negative for chills, fatigue, fever and malaise/fatigue.  HENT: Negative for congestion.   Respiratory: Positive for shortness of breath. Negative for cough, hemoptysis, chest tightness, wheezing and stridor.   Cardiovascular: Positive for chest pain and palpitations. Negative for leg swelling.  Gastrointestinal: Negative for abdominal pain, constipation, diarrhea, nausea and  vomiting.  Genitourinary: Negative for dysuria and flank pain.  Musculoskeletal: Negative for back pain, neck pain and neck stiffness.  Neurological: Negative for weakness, light-headedness and headaches.  Psychiatric/Behavioral: Negative for agitation.  All other systems reviewed and are negative.    Physical Exam Updated Vital Signs BP (!) 165/104 (BP Location: Left Arm)   Pulse (!) 50   Temp 98.1 F (36.7 C) (Oral)   Resp 16   LMP 08/07/2010 (Approximate) Comment: Patient declined pregnancy test  SpO2 99%   Physical Exam  Constitutional: She is oriented to person, place, and time. She appears well-developed and well-nourished.  Non-toxic appearance. She does not appear ill. No distress.  HENT:  Head: Normocephalic and atraumatic.  Right Ear: External ear normal.  Left Ear: External ear normal.  Nose: Nose  normal.  Mouth/Throat: Oropharynx is clear and moist. No oropharyngeal exudate.  Eyes: Pupils are equal, round, and reactive to light. Conjunctivae and EOM are normal.  Neck: Normal range of motion. Neck supple.  Cardiovascular: Intact distal pulses. Bradycardia present.  Murmur heard. Pulmonary/Chest: No stridor. No respiratory distress. She has no wheezes. She has no rales. She exhibits tenderness.  Abdominal: Soft. Bowel sounds are normal. She exhibits no distension. There is no tenderness. There is no rebound.  Musculoskeletal: She exhibits tenderness. She exhibits no edema.  Neurological: She is alert and oriented to person, place, and time. She has normal reflexes. No sensory deficit. She exhibits normal muscle tone. Coordination normal.  Skin: Skin is warm. Capillary refill takes less than 2 seconds. No rash noted. She is not diaphoretic. No erythema.  Psychiatric: She has a normal mood and affect.  Nursing note and vitals reviewed.    ED Treatments / Results  Labs (all labs ordered are listed, but only abnormal results are displayed) Labs Reviewed  BASIC  METABOLIC PANEL - Abnormal; Notable for the following components:      Result Value   Glucose, Bld 105 (*)    BUN 25 (*)    Creatinine, Ser 1.28 (*)    GFR calc non Af Amer 49 (*)    GFR calc Af Amer 57 (*)    All other components within normal limits  D-DIMER, QUANTITATIVE (NOT AT [facility]) - Abnormal; Notable for the following components:   D-Dimer, Quant 0.88 (*)    All other components within normal limits  CBC  HEPATIC FUNCTION PANEL  LIPASE, BLOOD  I-STAT TROPONIN, ED  I-STAT BETA HCG BLOOD, ED ([facility], [facility], [facility] ONLY)  POCT I-STAT TROPONIN I  I-STAT BETA HCG BLOOD, ED (NOT ORDERABLE)    EKG EKG Interpretation  Date/Time:  Friday December 31 2017 20:30:24 EST Ventricular Rate:  47 PR Interval:    QRS Duration: 106 QT Interval:  463 QTC Calculation: 410 R Axis:   71 Text Interpretation:  Sinus or ectopic atrial rhythm Atrial premature complexes Baseline wander in lead(s) I III aVL aVF Sinus bradycardia with PAC.  No STEMI Confirmed by Theda Belfast ([id number]) on 12/31/2017 9:14:54 PM   Radiology Dg Chest 2 View  Result Date: 12/31/2017 CLINICAL DATA:  LEFT chest pain radiating to arm and back. History of hypertension. EXAM: CHEST - 2 VIEW COMPARISON:  Chest radiograph March 30, 2016 FINDINGS: Cardiac silhouette is upper limits of normal in size. Mediastinal silhouette is not suspicious. No pleural effusions or focal consolidations. Trachea projects midline and there is no pneumothorax. Soft tissue planes and included osseous structures are non-suspicious. Mild degenerative changes thoracic spine. IMPRESSION: Borderline cardiomegaly.  No acute pulmonary process. Electronically Signed   By: Awilda Metro M.D.   On: 12/31/2017 20:59   Dg Wrist Complete Left  Result Date: 12/31/2017 CLINICAL DATA:  Patient fell today catching herself falling forward. Anterior left wrist pain. EXAM: LEFT WRIST - COMPLETE 3+ VIEW COMPARISON:  None. FINDINGS: There is no evidence of fracture or  dislocation. Carpal rows are maintained. Accessory ossicle is noted adjacent to the ulnar styloid. There is no evidence of arthropathy or other focal bone abnormality. Soft tissues are unremarkable. IMPRESSION: No acute osseous abnormality of the left wrist. Electronically Signed   By: Tollie Eth M.D.   On: 12/31/2017 22:45   Ct Angio Chest Pe W And/or Wo Contrast  Result Date: 12/31/2017 CLINICAL DATA:  Dyspnea, angina. EXAM: CT ANGIOGRAPHY CHEST WITH CONTRAST TECHNIQUE:  Multidetector CT imaging of the chest was performed using the standard protocol during bolus administration of intravenous contrast. Multiplanar CT image reconstructions and MIPs were obtained to evaluate the vascular anatomy. CONTRAST:  ISOVUE-370 IOPAMIDOL (ISOVUE-370) INJECTION 76% COMPARISON:  Chest CT angiogram dated 09/03/2012. FINDINGS: Cardiovascular: There is no pulmonary embolism identified within the main, lobar or segmental pulmonary arteries bilaterally. No thoracic aortic aneurysm. Borderline cardiomegaly. No pericardial effusion. Mediastinum/Nodes: No mass or enlarged lymph nodes seen within the mediastinum or perihilar regions. Esophagus appears normal. Trachea and central bronchi are unremarkable. Lungs/Pleura: Lungs are clear. No pleural effusion or pneumothorax. Upper Abdomen: Limited images of the upper abdomen are unremarkable. Musculoskeletal: No acute or suspicious osseous finding. Mild degenerative spurring within the midthoracic spine. Review of the MIP images confirms the above findings. IMPRESSION: No acute findings. No pulmonary embolism. No pneumonia or pulmonary edema. Electronically Signed   By: Bary Richard M.D.   On: 12/31/2017 23:24   Dg Hand Complete Left  Result Date: 12/31/2017 CLINICAL DATA:  Patient fell forward catching or soft. Anterior left wrist pain. EXAM: LEFT HAND - COMPLETE 3+ VIEW COMPARISON:  None. FINDINGS: Osteoarthritis of the triscaphe joint with subchondral cystic change of the  distal pole of the scaphoid. No acute fracture joint dislocation of the left hand and wrist distal radius ulna appear intact. Accessory ossicle seen adjacent to the ulnar styloid. Slight dorsal soft tissue swelling at the level of the metacarpal heads. IMPRESSION: Negative for acute fracture or malalignment. Osteoarthritis of triscaphe joint. Mild dorsal soft tissue swelling of the hand. Electronically Signed   By: Tollie Eth M.D.   On: 12/31/2017 22:47    Procedures Procedures (including critical care time)  Medications Ordered in ED Medications  sodium chloride (PF) 0.9 % injection (has no administration in time range)  iopamidol (ISOVUE-370) 76 % injection (has no administration in time range)  iopamidol (ISOVUE-370) 76 % injection 100 mL (100 mLs Intravenous Contrast Given 12/31/17 2306)     Initial Impression / Assessment and Plan / ED Course  I have reviewed the triage vital signs and the nursing notes.  Pertinent labs & imaging results that were available during my care of the patient were reviewed by me and considered in my medical decision making (see chart for details).     Pamela [name] is a 47 y.o. 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  Patient reports that this afternoon she developed chest pain in her left chest.  She reports it is a sharp pain in her left lateral chest.  She reports it radiates around towards her back as well as to her shoulder.  She reports this pain is somewhat similar to pain she is had in the past.  She denies vomiting but reports some mild nausea.  She reported some mild diaphoresis that was brief.  She reports she had palpitations and took her Cardizem with resolution of the palpitations.  She denies leg pain or leg swelling.  No history of DVT or PE.  She reports the pain is not exertional and  nonpleuritic.  She reports that last week she fell while going up some stairs and landed on her left hand and wrist.  She reports pain since then.  She is right-handed.  She denies abdominal pain, syncope, or other complaints.  On exam, lungs are clear.  Chest is tender in the left lateral  chest.  Patient had tenderness in her left wrist.  Normal grip strength and sensation in hands.  Normal pulses in the radial arteries.  No significant edema in legs.  Abdomen and back nontender.  EKG showed a sinus bradycardia.  No STEMI.  Patient will have laboratory testing including troponin and a d-dimer.  X-ray will be obtained of the chest as well as of the left wrist and hand.  Anticipate reassessment.  10:55 PM Patient's d-dimer returned positive.  Patient will have CT PE study.  CT scan showed no abnormalities, no PE.  Continue to suspect musculoskeletal discomfort.  Initial troponin was negative.  Her score calculated as a 3.  Other laboratory testing overall reassuring.  Patient is awaiting delta troponin.  If second troponin is negative, anticipate patient will be stable for discharge home for outpatient and PCP follow-up.  Care transferred to Dr. Eudelia Bunch while awaiting delta troponin results.   Final Clinical Impressions(s) / ED Diagnoses   Final diagnoses:  Precordial pain  AKI (acute kidney injury) (HCC)     Clinical Impression: 1. Precordial pain     Disposition: Care transferred to Dr. Eudelia Bunch while awaiting delta troponin results.  Anticipate discharge for outpatient management of left-sided likely musculoskeletal chest discomfort.  Patient will follow-up with her cardiologist in the next several days as discussed.  This note was prepared with assistance of Conservation officer, historic buildings. Occasional wrong-word or sound-a-like substitutions may have occurred due to the inherent limitations of voice recognition software.      Tyke Outman, Canary Brim, MD 01/01/18 [phone]

## 2018-01-01 LAB — POCT I-STAT TROPONIN I: Troponin i, poc: 0 ng/mL (ref 0.00–0.08)

## 2018-01-01 NOTE — ED Provider Notes (Signed)
I assumed care of this patient from Dr. Rush Landmarkegeler at 0000.  Please see their note for further details of Hx, PE.  Briefly patient is a 47 y.o. female who presented with chest pain most consistent with MSK.  Ruled out for PE.  EKG was reassuring and initial troponin negative.  Heart score of 3 plan for delta troponin..   Delta troponin negative.  The patient is safe for discharge with strict return precautions.  Disposition: Discharge  Condition: Good  I have discussed the results, Dx and Tx plan with the patient who expressed understanding and agree(s) with the plan. Discharge instructions discussed at great length. The patient was given strict return precautions who verbalized understanding of the instructions. No further questions at time of discharge.    ED Discharge Orders    None       Follow Up: Mike Gipouglas, Andre, FNP [address] [place] [address] [phone]  Schedule an appointment as soon as possible for a visit  in 3-5 days for repeat renal function test        Nira Connardama, Sherina Stammer Eduardo, MD 01/01/18 0210

## 2018-01-05 ENCOUNTER — Other Ambulatory Visit: Payer: Self-pay | Admitting: Family Medicine

## 2018-01-05 ENCOUNTER — Ambulatory Visit (INDEPENDENT_AMBULATORY_CARE_PROVIDER_SITE_OTHER): Payer: BLUE CROSS/BLUE SHIELD | Admitting: Family Medicine

## 2018-01-05 ENCOUNTER — Encounter: Payer: Self-pay | Admitting: Family Medicine

## 2018-01-05 VITALS — BP 150/71 | HR 48 | Temp 98.8°F | Resp 16 | Ht 63.0 in | Wt 234.0 lb

## 2018-01-05 DIAGNOSIS — R05 Cough: Secondary | ICD-10-CM

## 2018-01-05 DIAGNOSIS — Z0000 Encounter for general adult medical examination without abnormal findings: Secondary | ICD-10-CM

## 2018-01-05 DIAGNOSIS — I10 Essential (primary) hypertension: Secondary | ICD-10-CM

## 2018-01-05 DIAGNOSIS — R059 Cough, unspecified: Secondary | ICD-10-CM

## 2018-01-05 LAB — POCT URINALYSIS DIPSTICK
Bilirubin, UA: NEGATIVE
Blood, UA: NEGATIVE
Glucose, UA: NEGATIVE
Ketones, UA: NEGATIVE
Leukocytes, UA: NEGATIVE
Nitrite, UA: NEGATIVE
Protein, UA: POSITIVE — AB
Spec Grav, UA: 1.025 (ref 1.010–1.025)
Urobilinogen, UA: 0.2 E.U./dL
pH, UA: 5.5 (ref 5.0–8.0)

## 2018-01-05 MED ORDER — METHYLPREDNISOLONE 4 MG PO TBPK: ORAL_TABLET | ORAL | 0 refills | Status: DC

## 2018-01-05 MED ORDER — HYDROCHLOROTHIAZIDE 12.5 MG PO CAPS: 12.5000 mg | ORAL_CAPSULE | Freq: Every day | ORAL | 2 refills | Status: DC

## 2018-01-05 MED ORDER — BENZONATATE 200 MG PO CAPS: 200.0000 mg | ORAL_CAPSULE | Freq: Two times a day (BID) | ORAL | 0 refills | Status: DC | PRN

## 2018-01-05 NOTE — Progress Notes (Signed)
Established Patient Office Visit  Subjective:  Patient ID: Pamela [name], female    DOB: 11-01-1970  Age: 47 y.o. MRN: [id number]  CC:  Chief Complaint  Patient presents with  . Hypertension  . Sinus Problem  . Cough    HPI Pamela [name] presents for follow up with HTN. Patient states that she has not been taking her BP medications for the past several months. Patient is unsure as to what she is and is not taking. Patient seen in the ED on 12/31/2017 for chest pain. HTN was high then at 165/104.  Patient denies eating a sodium modified diet. Denies exercising.  Patient also states that she has a cough that has been present for a week. Denies sputum discoloration.  Past Medical History:  Diagnosis Date  . Chest pain   . Degenerative joint disease   . Herpes simplex   . Hx of echocardiogram 2012   normal  . Hypertension   . Migraine   . Migraine without aura, with intractable migraine, so stated, without mention of status migrainosus 07/26/2013  . Normal cardiac stress test 2012  . Obesity   . Osteoarthritis of knee     Past Surgical History:  Procedure Laterality Date  . ASD REPAIR  1977  . CARDIOVASCULAR STRESS TEST     negative  . CARPAL TUNNEL RELEASE    . CESAREAN SECTION    . EXPLORATION POST OPERATIVE OPEN HEART    . KNEE SURGERY  2012   right    Family History  Problem Relation Age of Onset  . Coronary artery disease Mother 61  . Stroke Mother   . Migraines Mother   . Migraines Sister   . Other Unknown         There is also history of diabetes, stroke, and cancer in the family    Social History   Socioeconomic History  . Marital status: Legally Separated    Spouse name: Not on file  . Number of children: 4  . Years of education: college AS  . Highest education level: Not on file  Occupational History  . Occupation: Magazine features editor: [employer]    Employer: [employer]  Social Needs  . Financial resource  strain: Not on file  . Food insecurity:    Worry: Not on file    Inability: Not on file  . Transportation needs:    Medical: Not on file    Non-medical: Not on file  Tobacco Use  . Smoking status: Never Smoker  . Smokeless tobacco: Never Used  Substance and Sexual Activity  . Alcohol use: No    Comment: occasional  . Drug use: No  . Sexual activity: Yes    Birth control/protection: None  Lifestyle  . Physical activity:    Days per week: Not on file    Minutes per session: Not on file  . Stress: Not on file  Relationships  . Social connections:    Talks on phone: Not on file    Gets together: Not on file    Attends religious service: Not on file    Active member of club or organization: Not on file    Attends meetings of clubs or organizations: Not on file    Relationship status: Not on file  . Intimate partner violence:    Fear of current or ex partner: Not on file    Emotionally abused: Not on file    Physically  abused: Not on file    Forced sexual activity: Not on file  Other Topics Concern  . Not on file  Social History Narrative  . Not on file    Outpatient Medications Prior to Visit  Medication Sig Dispense Refill  . cetirizine (ZYRTEC) 10 MG tablet Take 1 tablet (10 mg total) by mouth daily. 30 tablet 11  . Cholecalciferol (VITAMIN D3) 5000 UNITS TABS Take 5,000 Units by mouth daily.     Marland Kitchen. diltiazem (CARDIZEM) 30 MG tablet Take 30 mg by mouth every evening.     . fluticasone (FLONASE) 50 MCG/ACT nasal spray Place 2 sprays into both nostrils daily. 16 g 6  . metoprolol succinate (TOPROL-XL) 25 MG 24 hr tablet Take 25 mg by mouth daily.    . rizatriptan (MAXALT-MLT) 10 MG disintegrating tablet Take 1 tablet (10 mg total) by mouth 3 (three) times daily as needed for migraine. May repeat in 2 hours if needed 10 tablet 3  . valACYclovir (VALTREX) 500 MG tablet Take 1 tablet (500 mg total) by mouth 2 (two) times daily. 60 tablet 11  . acyclovir ointment (ZOVIRAX) 5 %  Apply 1 application topically every 3 (three) hours. (Patient not taking: Reported on 12/31/2017) 15 g 1  . gabapentin (NEURONTIN) 300 MG capsule TAKE ONE CAPSULE BY MOUTH FOUR TIMES DAILY (Patient not taking: Reported on 12/31/2017) 120 capsule 0  . ipratropium (ATROVENT) 0.06 % nasal spray Place 2 sprays into both nostrils 4 (four) times daily. (Patient not taking: Reported on 12/31/2017) 15 mL 0  . lidocaine (LIDODERM) 5 % Place 1 patch onto the skin daily. Remove & Discard patch within 12 hours or as directed by MD (Patient not taking: Reported on 02/05/2017) 30 patch 0  . Omega-3 Fatty Acids (FISH OIL) 1200 MG CPDR Take 1,200 mg by mouth at bedtime.     . Saline 0.9 % AERS Place 1 each into the nose 2 (two) times daily for 7 days. 1 Can 0   No facility-administered medications prior to visit.     Allergies  Allergen Reactions  . Amoxicillin Nausea Only    Has patient had a PCN reaction causing immediate rash, facial/tongue/throat swelling, SOB or lightheadedness with hypotension: No Has patient had a PCN reaction causing severe rash involving mucus membranes or skin necrosis: No Has patient had a PCN reaction that required hospitalization: No Has patient had a PCN reaction occurring within the last 10 years: No If all of the above answers are "NO", then may proceed with Cephalosporin use.   . Bee Venom Swelling  . Imitrex [Sumatriptan] Other (See Comments)    Gives chest pains  . Tramadol     Migraine   . Vicodin [Hydrocodone-Acetaminophen] Other (See Comments)    Migraines    ROS Review of Systems  Constitutional: Negative.   HENT: Negative.   Eyes: Negative.   Respiratory: Positive for cough.   Cardiovascular: Negative.   Gastrointestinal: Negative.   Endocrine: Negative.   Genitourinary: Negative.   Musculoskeletal: Negative.   Skin: Negative.   Allergic/Immunologic: Negative.   Neurological: Negative.   Hematological: Negative.   Psychiatric/Behavioral: Negative.        Objective:    Physical Exam  Constitutional: She is oriented to person, place, and time. She appears well-developed and well-nourished. No distress.  HENT:  Head: Normocephalic and atraumatic.  Eyes: Pupils are equal, round, and reactive to light. Conjunctivae and EOM are normal.  Neck: Normal range of motion.  Cardiovascular: Normal rate,  regular rhythm and normal heart sounds.  Pulmonary/Chest: Effort normal and breath sounds normal. No respiratory distress.  Musculoskeletal: Normal range of motion.  Neurological: She is alert and oriented to person, place, and time.  Skin: Skin is warm and dry.  Psychiatric: She has a normal mood and affect. Her behavior is normal. Judgment and thought content normal.  Nursing note and vitals reviewed.   BP (!) 150/71 (BP Location: Right Arm, Patient Position: Sitting, Cuff Size: Normal)   Pulse (!) 48   Temp 98.8 F (37.1 C) (Oral)   Resp 16   Ht 5\' 3"  (1.6 m)   Wt 234 lb (106.1 kg)   LMP 08/07/2010 (Approximate) Comment: Patient declined pregnancy test  SpO2 98%   BMI 41.45 kg/m  Wt Readings from Last 3 Encounters:  01/05/18 234 lb (106.1 kg)  09/17/17 239 lb (108.4 kg)  05/07/17 236 lb (107 kg)     There are no preventive care reminders to display for this patient.  Lab Results  Component Value Date   TSH 1.16 09/04/2016   Lab Results  Component Value Date   WBC 7.5 12/31/2017   HGB 12.6 12/31/2017   HCT 38.6 12/31/2017   MCV 85.2 12/31/2017   PLT 209 12/31/2017   Lab Results  Component Value Date   NA 141 12/31/2017   K 3.9 12/31/2017   CO2 25 12/31/2017   GLUCOSE 105 (H) 12/31/2017   BUN 25 (H) 12/31/2017   CREATININE 1.28 (H) 12/31/2017   BILITOT 0.4 12/31/2017   ALKPHOS 94 12/31/2017   AST 25 12/31/2017   ALT 22 12/31/2017   PROT 7.7 12/31/2017   ALBUMIN 4.4 12/31/2017   CALCIUM 9.4 12/31/2017   ANIONGAP 9 12/31/2017   Lab Results  Component Value Date   CHOL 147 03/08/2017   Lab Results   Component Value Date   HDL 43 03/08/2017   Lab Results  Component Value Date   LDLCALC 63 03/08/2017   Lab Results  Component Value Date   TRIG 206 (H) 03/08/2017   Lab Results  Component Value Date   CHOLHDL 3.4 03/08/2017   Lab Results  Component Value Date   HGBA1C 5.2 02/05/2017      Assessment & Plan:   Problem List Items Addressed This Visit      Cardiovascular and Mediastinum   Essential hypertension - Primary   Relevant Medications   hydrochlorothiazide (MICROZIDE) 12.5 MG capsule   Other Relevant Orders   Urinalysis Dipstick (Completed)    Other Visit Diagnoses    Cough       Relevant Medications   methylPREDNISolone (MEDROL DOSEPAK) 4 MG TBPK tablet      Meds ordered this encounter  Medications  . hydrochlorothiazide (MICROZIDE) 12.5 MG capsule    Sig: Take 1 capsule (12.5 mg total) by mouth daily.    Dispense:  30 capsule    Refill:  2  . methylPREDNISolone (MEDROL DOSEPAK) 4 MG TBPK tablet    Sig: Take as directed on pack    Dispense:  21 tablet    Refill:  0  . benzonatate (TESSALON) 200 MG capsule    Sig: Take 1 capsule (200 mg total) by mouth 2 (two) times daily as needed for cough.    Dispense:  20 capsule    Refill:  0    Follow-up: Return in about 3 months (around 04/07/2018), or 2 weeks BP check.    Mike Gip, FNP

## 2018-01-05 NOTE — Patient Instructions (Addendum)
I started a new BP medication called hydrochlorothiazide (HCTZ). Take this every morning to help with swelling.   Hydrochlorothiazide, HCTZ capsules or tablets What is this medicine? HYDROCHLOROTHIAZIDE (hye droe klor oh THYE a zide) is a diuretic. It increases the amount of urine passed, which causes the body to lose salt and water. This medicine is used to treat high blood pressure. It is also reduces the swelling and water retention caused by various medical conditions, such as heart, liver, or kidney disease. This medicine may be used for other purposes; ask your health care provider or pharmacist if you have questions. COMMON BRAND NAME(S): Esidrix, Ezide, HydroDIURIL, Microzide, Oretic, Zide What should I tell my health care provider before I take this medicine? They need to know if you have any of these conditions: -diabetes -gout -immune system problems, like lupus -kidney disease or kidney stones -liver disease -pancreatitis -small amount of urine or difficulty passing urine -an unusual or allergic reaction to hydrochlorothiazide, sulfa drugs, other medicines, foods, dyes, or preservatives -pregnant or trying to get pregnant -breast-feeding How should I use this medicine? Take this medicine by mouth with a glass of water. Follow the directions on the prescription label. Take your medicine at regular intervals. Remember that you will need to pass urine frequently after taking this medicine. Do not take your doses at a time of day that will cause you problems. Do not stop taking your medicine unless your doctor tells you to. Talk to your pediatrician regarding the use of this medicine in children. Special care may be needed. Overdosage: If you think you have taken too much of this medicine contact a poison control center or emergency room at once. NOTE: This medicine is only for you. Do not share this medicine with others. What if I miss a dose? If you miss a dose, take it as soon as  you can. If it is almost time for your next dose, take only that dose. Do not take double or extra doses. What may interact with this medicine? -cholestyramine -colestipol -digoxin -dofetilide -lithium -medicines for blood pressure -medicines for diabetes -medicines that relax muscles for surgery -other diuretics -steroid medicines like prednisone or cortisone This list may not describe all possible interactions. Give your health care provider a list of all the medicines, herbs, non-prescription drugs, or dietary supplements you use. Also tell them if you smoke, drink alcohol, or use illegal drugs. Some items may interact with your medicine. What should I watch for while using this medicine? Visit your doctor or health care professional for regular checks on your progress. Check your blood pressure as directed. Ask your doctor or health care professional what your blood pressure should be and when you should contact him or her. You may need to be on a special diet while taking this medicine. Ask your doctor. Check with your doctor or health care professional if you get an attack of severe diarrhea, nausea and vomiting, or if you sweat a lot. The loss of too much body fluid can make it dangerous for you to take this medicine. You may get drowsy or dizzy. Do not drive, use machinery, or do anything that needs mental alertness until you know how this medicine affects you. Do not stand or sit up quickly, especially if you are an older patient. This reduces the risk of dizzy or fainting spells. Alcohol may interfere with the effect of this medicine. Avoid alcoholic drinks. This medicine may affect your blood sugar level. If you have  diabetes, check with your doctor or health care professional before changing the dose of your diabetic medicine. This medicine can make you more sensitive to the sun. Keep out of the sun. If you cannot avoid being in the sun, wear protective clothing and use sunscreen. Do  not use sun lamps or tanning beds/booths. What side effects may I notice from receiving this medicine? Side effects that you should report to your doctor or health care professional as soon as possible: -allergic reactions such as skin rash or itching, hives, swelling of the lips, mouth, tongue, or throat -changes in vision -chest pain -eye pain -fast or irregular heartbeat -feeling faint or lightheaded, falls -gout attack -muscle pain or cramps -pain or difficulty when passing urine -pain, tingling, numbness in the hands or feet -redness, blistering, peeling or loosening of the skin, including inside the mouth -unusually weak or tired Side effects that usually do not require medical attention (report to your doctor or health care professional if they continue or are bothersome): -change in sex drive or performance -dry mouth -headache -stomach upset This list may not describe all possible side effects. Call your doctor for medical advice about side effects. You may report side effects to FDA at 1-800-FDA-1088. Where should I keep my medicine? Keep out of the reach of children. Store at room temperature between 15 and 30 degrees C (59 and 86 degrees F). Do not freeze. Protect from light and moisture. Keep container closed tightly. Throw away any unused medicine after the expiration date. NOTE: This sheet is a summary. It may not cover all possible information. If you have questions about this medicine, talk to your doctor, pharmacist, or health care provider.  2018 Elsevier/Gold Standard (2009-09-20 12:57:37)  Cough, Adult A cough helps to clear your throat and lungs. A cough may last only 2-3 weeks (acute), or it may last longer than 8 weeks (chronic). Many different things can cause a cough. A cough may be a sign of an illness or another medical condition. Follow these instructions at home:  Pay attention to any changes in your cough.  Take medicines only as told by your  doctor. ? If you were prescribed an antibiotic medicine, take it as told by your doctor. Do not stop taking it even if you start to feel better. ? Talk with your doctor before you try using a cough medicine.  Drink enough fluid to keep your pee (urine) clear or pale yellow.  If the air is dry, use a cold steam vaporizer or humidifier in your home.  Stay away from things that make you cough at work or at home.  If your cough is worse at night, try using extra pillows to raise your head up higher while you sleep.  Do not smoke, and try not to be around smoke. If you need help quitting, ask your doctor.  Do not have caffeine.  Do not drink alcohol.  Rest as needed. Contact a doctor if:  You have new problems (symptoms).  You cough up yellow fluid (pus).  Your cough does not get better after 2-3 weeks, or your cough gets worse.  Medicine does not help your cough and you are not sleeping well.  You have pain that gets worse or pain that is not helped with medicine.  You have a fever.  You are losing weight and you do not know why.  You have night sweats. Get help right away if:  You cough up blood.  You have trouble  breathing.  Your heartbeat is very fast. This information is not intended to replace advice given to you by your health care provider. Make sure you discuss any questions you have with your health care provider. Document Released: 10/09/2010 Document Revised: 07/04/2015 Document Reviewed: 04/04/2014 Elsevier Interactive Patient Education  [employer].

## 2018-01-13 ENCOUNTER — Telehealth: Payer: Self-pay

## 2018-01-13 NOTE — Telephone Encounter (Signed)
Called and spoke with patient, she states her new bp med (hctz) is making her sleepy and dizzy and she is having trouble staying awake at work. Please advise. Thanks!

## 2018-01-19 ENCOUNTER — Ambulatory Visit: Payer: BLUE CROSS/BLUE SHIELD

## 2018-01-19 VITALS — BP 141/63

## 2018-01-19 DIAGNOSIS — I10 Essential (primary) hypertension: Secondary | ICD-10-CM

## 2018-02-20 ENCOUNTER — Other Ambulatory Visit: Payer: Self-pay | Admitting: Family Medicine

## 2018-02-20 DIAGNOSIS — B009 Herpesviral infection, unspecified: Secondary | ICD-10-CM

## 2018-02-21 ENCOUNTER — Ambulatory Visit
Admission: RE | Admit: 2018-02-21 | Discharge: 2018-02-21 | Disposition: A | Discharge status: Home / Self Care | Payer: Self-pay | Source: Ambulatory Visit | Attending: Family Medicine | Admitting: Family Medicine

## 2018-02-21 DIAGNOSIS — Z0000 Encounter for general adult medical examination without abnormal findings: Secondary | ICD-10-CM

## 2018-03-15 ENCOUNTER — Ambulatory Visit (HOSPITAL_COMMUNITY)
Admission: EM | Admit: 2018-03-15 | Discharge: 2018-03-15 | Disposition: A | Discharge status: Home / Self Care | Payer: Self-pay | Attending: Internal Medicine | Admitting: Internal Medicine

## 2018-03-15 ENCOUNTER — Encounter (HOSPITAL_COMMUNITY): Payer: Self-pay

## 2018-03-15 DIAGNOSIS — J111 Influenza due to unidentified influenza virus with other respiratory manifestations: Secondary | ICD-10-CM

## 2018-03-15 DIAGNOSIS — I10 Essential (primary) hypertension: Secondary | ICD-10-CM

## 2018-03-15 MED ORDER — METOPROLOL SUCCINATE ER 25 MG PO TB24: 25.0000 mg | ORAL_TABLET | Freq: Every day | ORAL | 1 refills | Status: DC

## 2018-03-15 MED ORDER — OSELTAMIVIR PHOSPHATE 75 MG PO CAPS: 75.0000 mg | ORAL_CAPSULE | Freq: Two times a day (BID) | ORAL | 0 refills | Status: DC

## 2018-03-15 MED ORDER — HYDROCHLOROTHIAZIDE 12.5 MG PO CAPS: 12.5000 mg | ORAL_CAPSULE | Freq: Every day | ORAL | 2 refills | Status: DC

## 2018-03-15 NOTE — ED Triage Notes (Signed)
Pt presents with flu like symptoms x 1 day. Ear ache, cough, body aches, fever and chills.

## 2018-03-15 NOTE — ED Provider Notes (Signed)
[facility]    CSN: [id number] Arrival date & time: 03/15/18  0827     History   Chief Complaint Chief Complaint  Patient presents with  . Influenza    HPI SENECA MOBBS is a 48 y.o. female with past medical history of hypertension comes to the urgent care on account of generalized body aches, runny nose, sore throat low-grade fever and chills.  Symptoms started 3 days ago and is been persistent.  No aggravating factors.  Associated symptoms: Cough, no sputum production, no wheezing.  No dizziness, near syncope or syncopal episodes.  Patient's daughter was diagnosed with influenza and prescribed Tamiflu over the weekend.  HPI  Past Medical History:  Diagnosis Date  . Chest pain   . Degenerative joint disease   . Herpes simplex   . Hx of echocardiogram 2012   normal  . Hypertension   . Migraine   . Migraine without aura, with intractable migraine, so stated, without mention of status migrainosus 07/26/2013  . Normal cardiac stress test 2012  . Obesity   . Osteoarthritis of knee     Patient Active Problem List   Diagnosis Date Noted  . 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 Essential hypertension 02/21/2016  . Bradycardia 02/21/2016  . Hypertriglyceridemia 05/07/2015  . Murmur 11/10/2013  . Congenital heart disease 11/10/2013  . Migraine without aura, with intractable migraine, so stated, without mention of status migrainosus 07/26/2013  . Pap smear for cervical cancer screening 09/29/2012  . Morbid obesity (HCC) 09/29/2012  . Polyphagia(783.6) 09/29/2012  . Chest pain 09/03/2012    Past Surgical History:  Procedure Laterality Date  . ASD REPAIR  1977  . CARDIOVASCULAR STRESS TEST     negative  . CARPAL TUNNEL RELEASE    . CESAREAN SECTION    . EXPLORATION POST OPERATIVE OPEN HEART    . KNEE SURGERY  2012   right    OB History   No obstetric history on file.      Home  Medications    Prior to Admission medications   Medication Sig Start Date End Date Taking? Authorizing Provider  meloxicam (MOBIC) 15 MG tablet Take 15 mg by mouth daily.   Yes [provider]  acyclovir ointment (ZOVIRAX) 5 % Apply 1 application topically every 3 (three) hours. Patient not taking: Reported on 12/31/2017 08/06/16   Bing Neighbors, FNP  benzonatate (TESSALON) 200 MG capsule Take 1 capsule (200 mg total) by mouth 2 (two) times daily as needed for cough. 01/05/18   Mike Gip, FNP  cetirizine (ZYRTEC) 10 MG tablet Take 1 tablet (10 mg total) by mouth daily. 09/17/17   Mike Gip, FNP  Cholecalciferol (VITAMIN D3) 5000 UNITS TABS Take 5,000 Units by mouth daily.     [provider]  diltiazem (CARDIZEM) 30 MG tablet Take 30 mg by mouth every evening.     [provider]  fluticasone (FLONASE) 50 MCG/ACT nasal spray Place 2 sprays into both nostrils daily. 09/17/17   Mike Gip, FNP  gabapentin (NEURONTIN) 300 MG capsule TAKE ONE CAPSULE BY MOUTH FOUR TIMES DAILY Patient not taking: Reported on 12/31/2017 04/05/17   Bing Neighbors, FNP  hydrochlorothiazide (MICROZIDE) 12.5 MG capsule Take 1 capsule (12.5 mg total) by mouth daily. 03/15/18   LampteyBritta Mccreedy, MD  ipratropium (ATROVENT) 0.06 % nasal spray Place 2 sprays into both nostrils 4 (four) times daily. Patient not taking:  Reported on 12/31/2017 04/17/17   Belinda FisherYu, Amy V, PA-C  lidocaine (LIDODERM) 5 % Place 1 patch onto the skin daily. Remove & Discard patch within 12 hours or as directed by MD Patient not taking: Reported on 02/05/2017 08/06/16   Bing NeighborsHarris, Kimberly S, FNP  methylPREDNISolone (MEDROL DOSEPAK) 4 MG TBPK tablet Take as directed on pack 01/05/18   Mike Gipouglas, Andre, FNP  metoprolol succinate (TOPROL-XL) 25 MG 24 hr tablet Take 1 tablet (25 mg total) by mouth daily. 03/15/18   Rosezella Kronick, Britta MccreedyPhilip O, MD  Omega-3 Fatty Acids (FISH OIL) 1200 MG CPDR Take 1,200 mg by mouth at bedtime.     [provider]  oseltamivir (TAMIFLU) 75 MG capsule Take 1 capsule (75 mg total) by mouth every 12 (twelve) hours. 03/15/18   Bronnie Vasseur, Britta MccreedyPhilip O, MD  rizatriptan (MAXALT-MLT) 10 MG disintegrating tablet Take 1 tablet (10 mg total) by mouth 3 (three) times daily as needed for migraine. May repeat in 2 hours if needed 09/17/17   Mike Gipouglas, Andre, FNP  Saline 0.9 % AERS Place 1 each into the nose 2 (two) times daily for 7 days. 05/07/17 12/31/17  Massie MaroonHollis, Lachina M, FNP  valACYclovir (VALTREX) 500 MG tablet TAKE 1 TABLET BY MOUTH TWICE DAILY 02/21/18   Mike Gipouglas, Andre, FNP    Family History Family History  Problem Relation Age of Onset  . Coronary artery disease Mother 3146  . Stroke Mother   . Migraines Mother   . Migraines Sister   . Other Other         There is also history of diabetes, stroke, and cancer in the family    Social History Social History   Tobacco Use  . Smoking status: Never Smoker  . Smokeless tobacco: Never Used  Substance Use Topics  . Alcohol use: No    Comment: occasional  . Drug use: No     Allergies   Amoxicillin; Bee venom; Imitrex [sumatriptan]; Tramadol; and Vicodin [hydrocodone-acetaminophen]   Review of Systems Review of Systems  Constitutional: Positive for activity change, appetite change, chills, fatigue and fever.  HENT: Positive for congestion, ear pain, rhinorrhea, sneezing and sore throat. Negative for ear discharge, mouth sores, postnasal drip, sinus pressure, tinnitus and voice change.   Eyes: Negative for pain, discharge and itching.  Respiratory: Positive for cough and shortness of breath. Negative for chest tightness, wheezing and stridor.   Cardiovascular: Negative for chest pain and palpitations.  Gastrointestinal: Negative for abdominal distention, abdominal pain, constipation and diarrhea.  Endocrine: Negative for cold intolerance and heat intolerance.  Genitourinary: Negative for difficulty urinating.  Musculoskeletal: Positive for  arthralgias, back pain and myalgias.  Skin: Negative for color change and rash.  Neurological: Negative for dizziness, syncope and numbness.  Hematological: Negative for adenopathy.     Physical Exam Triage Vital Signs ED Triage Vitals  Enc Vitals Group     BP 03/15/18 0904 (!) 163/86     Pulse Rate 03/15/18 0904 (!) 58     Resp 03/15/18 0904 18     Temp 03/15/18 0904 99.1 F (37.3 C)     Temp Source 03/15/18 0904 Temporal     SpO2 03/15/18 0904 99 %     Weight --      Height --      Head Circumference --      Peak Flow --      Pain Score 03/15/18 0941 10     Pain Loc --      Pain Edu? --  Excl. in GC? --    No data found.  Updated Vital Signs BP (!) 163/86 (BP Location: Left Arm)   Pulse (!) 58   Temp 99.1 F (37.3 C) (Temporal)   Resp 18   LMP 08/07/2010 (Approximate) Comment: Patient declined pregnancy test  SpO2 99%   Visual Acuity Right Eye Distance:   Left Eye Distance:   Bilateral Distance:    Right Eye Near:   Left Eye Near:    Bilateral Near:     Physical Exam Constitutional:      Appearance: She is ill-appearing.  HENT:     Head: Normocephalic.     Right Ear: Tympanic membrane normal.     Left Ear: Tympanic membrane normal.     Nose: Rhinorrhea present.     Mouth/Throat:     Mouth: Mucous membranes are moist.     Pharynx: Posterior oropharyngeal erythema present.  Eyes:     General:        Right eye: No discharge.        Left eye: Discharge present.    Conjunctiva/sclera: Conjunctivae normal.     Pupils: Pupils are equal, round, and reactive to light.  Neck:     Musculoskeletal: Normal range of motion.  Cardiovascular:     Rate and Rhythm: Normal rate and regular rhythm.     Pulses: Normal pulses.     Heart sounds: Murmur present. No gallop.   Pulmonary:     Effort: Pulmonary effort is normal.     Breath sounds: Normal breath sounds.  Abdominal:     General: Abdomen is flat. Bowel sounds are normal.  Musculoskeletal: Normal  range of motion.  Lymphadenopathy:     Cervical: No cervical adenopathy.  Neurological:     Mental Status: She is alert.      UC Treatments / Results  Labs (all labs ordered are listed, but only abnormal results are displayed) Labs Reviewed - No data to display  EKG None  Radiology No results found.  Procedures Procedures (including critical care time)  Medications Ordered in UC Medications - No data to display  Initial Impression / Assessment and Plan / UC Course  I have reviewed the triage vital signs and the nursing notes.  Pertinent labs & imaging results that were available during my care of the patient were reviewed by me and considered in my medical decision making (see chart for details).     1.Influenza infection: Tamiflu 75 mg orally twice daily for 5 days Tylenol/NSAIDs for pain/fever Encourage oral fluid intake to stay hydrated  Final Clinical Impressions(s) / UC Diagnoses   Final diagnoses:  Influenza   Discharge Instructions   None    ED Prescriptions    Medication Sig Dispense Auth. Provider   hydrochlorothiazide (MICROZIDE) 12.5 MG capsule Take 1 capsule (12.5 mg total) by mouth daily. 30 capsule Shenae Bonanno, Britta MccreedyPhilip O, MD   metoprolol succinate (TOPROL-XL) 25 MG 24 hr tablet Take 1 tablet (25 mg total) by mouth daily. 30 tablet Gary Bultman, Britta MccreedyPhilip O, MD   oseltamivir (TAMIFLU) 75 MG capsule Take 1 capsule (75 mg total) by mouth every 12 (twelve) hours. 10 capsule Fabrice Dyal, Britta MccreedyPhilip O, MD     Controlled Substance Prescriptions [place] Controlled Substance Registry consulted? No   Merrilee JanskyLamptey, Zoei Amison O, MD 03/15/18 1125

## 2018-03-17 ENCOUNTER — Encounter: Payer: Self-pay | Admitting: Family Medicine

## 2018-03-17 ENCOUNTER — Ambulatory Visit (INDEPENDENT_AMBULATORY_CARE_PROVIDER_SITE_OTHER): Payer: Self-pay | Admitting: Family Medicine

## 2018-03-17 ENCOUNTER — Ambulatory Visit (HOSPITAL_COMMUNITY)
Admission: RE | Admit: 2018-03-17 | Discharge: 2018-03-17 | Disposition: A | Discharge status: Home / Self Care | Payer: Self-pay | Source: Ambulatory Visit | Attending: Family Medicine | Admitting: Family Medicine

## 2018-03-17 VITALS — BP 122/71 | HR 53 | Temp 99.2°F | Resp 16 | Ht 63.0 in | Wt 235.0 lb

## 2018-03-17 DIAGNOSIS — R059 Cough, unspecified: Secondary | ICD-10-CM

## 2018-03-17 DIAGNOSIS — R05 Cough: Secondary | ICD-10-CM | POA: Insufficient documentation

## 2018-03-17 DIAGNOSIS — R062 Wheezing: Secondary | ICD-10-CM

## 2018-03-17 LAB — POC INFLUENZA A&B (BINAX/QUICKVUE)
Influenza A, POC: NEGATIVE
Influenza B, POC: NEGATIVE

## 2018-03-17 LAB — POCT RAPID STREP A (OFFICE): Rapid Strep A Screen: NEGATIVE

## 2018-03-17 MED ORDER — IPRATROPIUM BROMIDE 0.02 % IN SOLN
0.5000 mg | Freq: Once | RESPIRATORY_TRACT | Status: AC
  Administered 2018-03-17: 0.5 mg via RESPIRATORY_TRACT

## 2018-03-17 MED ORDER — ALBUTEROL SULFATE (2.5 MG/3ML) 0.083% IN NEBU
2.5000 mg | INHALATION_SOLUTION | Freq: Once | RESPIRATORY_TRACT | Status: AC
  Administered 2018-03-17: 2.5 mg via RESPIRATORY_TRACT

## 2018-03-17 MED ORDER — DOXYCYCLINE HYCLATE 100 MG PO TABS: 100.0000 mg | ORAL_TABLET | Freq: Two times a day (BID) | ORAL | 0 refills | Status: DC

## 2018-03-17 MED ORDER — ALBUTEROL SULFATE HFA 108 (90 BASE) MCG/ACT IN AERS: 2.0000 | INHALATION_SPRAY | Freq: Four times a day (QID) | RESPIRATORY_TRACT | 0 refills | Status: AC | PRN

## 2018-03-17 NOTE — Progress Notes (Signed)
[facility]   Progress Note: Sick Visit Provider: Mike Gip, FNP  SUBJECTIVE:   Pamela [name] is a 48 y.o. female who  has a past medical history of Chest pain, Degenerative joint disease, Herpes simplex, echocardiogram (2012), Hypertension, Migraine, Migraine without aura, with intractable migraine, so stated, without mention of status migrainosus (07/26/2013), Normal cardiac stress test (2012), Obesity, and Osteoarthritis of knee.. Patient presents today for Cough (chest congestion ) and Shortness of Breath  Patient states that she was given tamiflu by urgent care. States that she stopped taking due to side effects of nausea and diarrhea. Patient states that she continues to have fever, chills, coughing and wheezing.   Review of Systems  Constitutional: Positive for chills, diaphoresis and fever.  HENT: Positive for sore throat.   Respiratory: Positive for cough, sputum production and wheezing.   Cardiovascular: Negative.   Gastrointestinal: Positive for diarrhea.  Genitourinary: Negative.   Musculoskeletal: Positive for myalgias.  Skin: Negative.   Neurological: Negative.   Endo/Heme/Allergies: Negative.   Psychiatric/Behavioral: Negative.      OBJECTIVE: BP 122/71 (BP Location: Left Arm, Patient Position: Sitting, Cuff Size: Large)   Pulse (!) 53   Temp 99.2 F (37.3 C) (Oral)   Resp 16   Ht 5\' 3"  (1.6 m)   Wt 235 lb (106.6 kg)   LMP 08/07/2010 (Approximate) Comment: Patient declined pregnancy test  SpO2 99%   BMI 41.63 kg/m   Wt Readings from Last 3 Encounters:  03/17/18 235 lb (106.6 kg)  01/05/18 234 lb (106.1 kg)  09/17/17 239 lb (108.4 kg)     Physical Exam Vitals signs and nursing note reviewed.  Constitutional:      General: She is not in acute distress.    Appearance: She is well-developed.  HENT:     Head: Normocephalic and atraumatic.     Mouth/Throat:     Mouth: Mucous membranes are moist.    Eyes:     Conjunctiva/sclera: Conjunctivae normal.     Pupils: Pupils are equal, round, and reactive to light.  Neck:     Musculoskeletal: Normal range of motion.  Cardiovascular:     Rate and Rhythm: Normal rate and regular rhythm.     Heart sounds: Normal heart sounds.  Pulmonary:     Effort: Pulmonary effort is normal. No respiratory distress.     Breath sounds: Examination of the right-upper field reveals wheezing. Examination of the left-upper field reveals wheezing. Examination of the right-middle field reveals wheezing. Examination of the left-middle field reveals wheezing. Examination of the right-lower field reveals wheezing. Examination of the left-lower field reveals wheezing. Wheezing present.  Abdominal:     General: Bowel sounds are normal. There is no distension.     Palpations: Abdomen is soft.  Musculoskeletal: Normal range of motion.  Skin:    General: Skin is warm and dry.  Neurological:     Mental Status: She is alert and oriented to person, place, and time.  Psychiatric:        Mood and Affect: Mood normal.        Behavior: Behavior normal.        Thought Content: Thought content normal.     ASSESSMENT/PLAN:   1. Cough Nebulizer treatment in the office today. Most likely viral in nature. Started on doxycycline due to past bronchial history. Chest xray. Increase fluids and rest. Note given for missed work.  - albuterol (PROVENTIL) (2.5 MG/3ML) 0.083% nebulizer solution 2.5  mg - ipratropium (ATROVENT) nebulizer solution 0.5 mg - POC Influenza A&B (Binax test) - Rapid Strep A - DG Chest 2 View; Future - doxycycline (VIBRA-TABS) 100 MG tablet; Take 1 tablet (100 mg total) by mouth 2 (two) times daily.  Dispense: 20 tablet; Refill: 0  2. Wheezing - albuterol (PROVENTIL HFA;VENTOLIN HFA) 108 (90 Base) MCG/ACT inhaler; Inhale 2 puffs into the lungs every 6 (six) hours as needed for wheezing or shortness of breath.  Dispense: 1 Inhaler; Refill: 0       The  patient was given clear instructions to go to ER or return to medical center if symptoms do not improve, worsen or new problems develop. The patient verbalized understanding and agreed with plan of care.   Ms. Freda Jacksonndr L. Riley Lamouglas, FNP-BC [facility] [address]  [place], [place] [id number] [phone]     This note has been created with Dragon speech recognition software and smart phrase technology. Any transcriptional errors are unintentional.

## 2018-04-07 ENCOUNTER — Ambulatory Visit: Payer: Self-pay | Admitting: Family Medicine

## 2018-04-18 ENCOUNTER — Ambulatory Visit (HOSPITAL_COMMUNITY)
Admission: EM | Admit: 2018-04-18 | Discharge: 2018-04-18 | Disposition: A | Discharge status: Home / Self Care | Payer: Self-pay | Attending: Family Medicine | Admitting: Family Medicine

## 2018-04-18 ENCOUNTER — Other Ambulatory Visit: Payer: Self-pay

## 2018-04-18 ENCOUNTER — Encounter (HOSPITAL_COMMUNITY): Payer: Self-pay

## 2018-04-18 ENCOUNTER — Other Ambulatory Visit: Payer: Self-pay | Admitting: Family Medicine

## 2018-04-18 DIAGNOSIS — B009 Herpesviral infection, unspecified: Secondary | ICD-10-CM

## 2018-04-18 DIAGNOSIS — L958 Other vasculitis limited to the skin: Secondary | ICD-10-CM

## 2018-04-18 DIAGNOSIS — L959 Vasculitis limited to the skin, unspecified: Secondary | ICD-10-CM

## 2018-04-18 MED ORDER — PREDNISONE 20 MG PO TABS: ORAL_TABLET | ORAL | 0 refills | Status: DC

## 2018-04-18 NOTE — ED Triage Notes (Signed)
Pt cc she has a rash this started on Thrusday now its spreading all over.

## 2018-04-18 NOTE — ED Provider Notes (Signed)
[facility]    CSN: [id number] Arrival date & time: 04/18/18  1625     History   Chief Complaint Chief Complaint  Patient presents with  . Rash    HPI Pamela [name] is a 48 y.o. female.   48 year old woman who was seen here 1 month ago for the flu, now presents with rash.  Rash began on the extremities and has migrated to the torso.  It is palpable and nonblanching.     Past Medical History:  Diagnosis Date  . Chest pain   . Degenerative joint disease   . Herpes simplex   . Hx of echocardiogram 2012   normal  . Hypertension   . Migraine   . Migraine without aura, with intractable migraine, so stated, without mention of status migrainosus 07/26/2013  . Normal cardiac stress test 2012  . Obesity   . Osteoarthritis of knee     Patient Active Problem List   Diagnosis Date Noted  . 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 Essential hypertension 02/21/2016  . Bradycardia 02/21/2016  . Hypertriglyceridemia 05/07/2015  . Murmur 11/10/2013  . Congenital heart disease 11/10/2013  . Migraine without aura, with intractable migraine, so stated, without mention of status migrainosus 07/26/2013  . Pap smear for cervical cancer screening 09/29/2012  . Morbid obesity (HCC) 09/29/2012  . Polyphagia(783.6) 09/29/2012  . Chest pain 09/03/2012    Past Surgical History:  Procedure Laterality Date  . ASD REPAIR  1977  . CARDIOVASCULAR STRESS TEST     negative  . CARPAL TUNNEL RELEASE    . CESAREAN SECTION    . EXPLORATION POST OPERATIVE OPEN HEART    . KNEE SURGERY  2012   right    OB History   No obstetric history on file.      Home Medications    Prior to Admission medications   Medication Sig Start Date End Date Taking? Authorizing Provider  albuterol (PROVENTIL HFA;VENTOLIN HFA) 108 (90 Base) MCG/ACT inhaler Inhale 2 puffs into the lungs every 6 (six) hours as needed for wheezing  or shortness of breath. 03/17/18   Mike Gipouglas, Andre, FNP  cetirizine (ZYRTEC) 10 MG tablet Take 1 tablet (10 mg total) by mouth daily. 09/17/17   Mike Gipouglas, Andre, FNP  Cholecalciferol (VITAMIN D3) 5000 UNITS TABS Take 5,000 Units by mouth daily.     [provider]  fluticasone (FLONASE) 50 MCG/ACT nasal spray Place 2 sprays into both nostrils daily. 09/17/17   Mike Gipouglas, Andre, FNP  hydrochlorothiazide (MICROZIDE) 12.5 MG capsule Take 1 capsule (12.5 mg total) by mouth daily. 03/15/18   Lamptey, Britta MccreedyPhilip O, MD  metoprolol succinate (TOPROL-XL) 25 MG 24 hr tablet Take 1 tablet (25 mg total) by mouth daily. 03/15/18   Lamptey, Britta MccreedyPhilip O, MD  Omega-3 Fatty Acids (FISH OIL) 1200 MG CPDR Take 1,200 mg by mouth at bedtime.     [provider]  predniSONE (DELTASONE) 20 MG tablet Two daily with food 04/18/18   Elvina SidleLauenstein, Adalie Mand, MD  rizatriptan (MAXALT-MLT) 10 MG disintegrating tablet Take 1 tablet (10 mg total) by mouth 3 (three) times daily as needed for migraine. May repeat in 2 hours if needed 09/17/17   Mike Gipouglas, Andre, FNP  valACYclovir (VALTREX) 500 MG tablet TAKE 1 TABLET BY MOUTH TWICE DAILY 02/21/18   Mike Gipouglas, Andre, FNP    Family History Family History  Problem Relation Age of Onset  . Coronary artery disease Mother  67  . Stroke Mother   . Migraines Mother   . Migraines Sister   . Other Other         There is also history of diabetes, stroke, and cancer in the family    Social History Social History   Tobacco Use  . Smoking status: Never Smoker  . Smokeless tobacco: Never Used  Substance Use Topics  . Alcohol use: No    Comment: occasional  . Drug use: No     Allergies   Amoxicillin; Bee venom; Imitrex [sumatriptan]; Tramadol; and Vicodin [hydrocodone-acetaminophen]   Review of Systems Review of Systems   Physical Exam Triage Vital Signs ED Triage Vitals  Enc Vitals Group     BP 04/18/18 1716 (!) 153/75     Pulse Rate 04/18/18 1716 (!) 59     Resp 04/18/18 1716 18      Temp 04/18/18 1716 97.7 F (36.5 C)     Temp src --      SpO2 04/18/18 1716 100 %     Weight 04/18/18 1715 253 lb (114.8 kg)     Height --      Head Circumference --      Peak Flow --      Pain Score 04/18/18 1714 1     Pain Loc --      Pain Edu? --      Excl. in GC? --    No data found.  Updated Vital Signs BP (!) 153/75 (BP Location: Right Arm)   Pulse (!) 59   Temp 97.7 F (36.5 C)   Resp 18   Wt 114.8 kg   LMP 08/07/2010 (Approximate) Comment: Patient declined pregnancy test  SpO2 100%   BMI 44.82 kg/m    Physical Exam Vitals signs and nursing note reviewed.  Constitutional:      Appearance: Normal appearance.  HENT:     Head: Normocephalic and atraumatic.  Neck:     Musculoskeletal: Normal range of motion and neck supple.  Pulmonary:     Effort: Pulmonary effort is normal.  Musculoskeletal: Normal range of motion.  Skin:    General: Skin is warm and dry.     Comments: Diffuse homogeneous small pinpoint petechiae on all 4  extremities.  Neurological:     General: No focal deficit present.     Mental Status: She is alert.  Psychiatric:        Mood and Affect: Mood normal.      UC Treatments / Results  Labs (all labs ordered are listed, but only abnormal results are displayed) Labs Reviewed - No data to display  EKG None  Radiology No results found.  Procedures Procedures (including critical care time)  Medications Ordered in UC Medications - No data to display  Initial Impression / Assessment and Plan / UC Course  I have reviewed the triage vital signs and the nursing notes.  Pertinent labs & imaging results that were available during my care of the patient were reviewed by me and considered in my medical decision making (see chart for details).    Final Clinical Impressions(s) / UC Diagnoses   Final diagnoses:  Cutaneous leukocytoclastic angiitis   Discharge Instructions   None    ED Prescriptions    Medication Sig Dispense Auth.  Provider   predniSONE (DELTASONE) 20 MG tablet Two daily with food 10 tablet Elvina Sidle, MD     Controlled Substance Prescriptions [place] Controlled Substance Registry consulted? Not Applicable   Elvina Sidle,  MD 04/18/18 1731

## 2018-04-20 ENCOUNTER — Ambulatory Visit: Payer: Self-pay | Admitting: Family Medicine

## 2018-04-27 ENCOUNTER — Telehealth: Payer: Self-pay

## 2018-04-27 ENCOUNTER — Ambulatory Visit (INDEPENDENT_AMBULATORY_CARE_PROVIDER_SITE_OTHER): Payer: Self-pay | Admitting: Family Medicine

## 2018-04-27 ENCOUNTER — Encounter: Payer: Self-pay | Admitting: Family Medicine

## 2018-04-27 ENCOUNTER — Other Ambulatory Visit: Payer: Self-pay

## 2018-04-27 VITALS — BP 144/66 | HR 47 | Temp 97.8°F | Ht 62.0 in | Wt 237.2 lb

## 2018-04-27 DIAGNOSIS — G43809 Other migraine, not intractable, without status migrainosus: Secondary | ICD-10-CM

## 2018-04-27 DIAGNOSIS — I10 Essential (primary) hypertension: Secondary | ICD-10-CM

## 2018-04-27 DIAGNOSIS — B009 Herpesviral infection, unspecified: Secondary | ICD-10-CM

## 2018-04-27 LAB — POCT URINALYSIS DIP (CLINITEK)
Bilirubin, UA: NEGATIVE
Blood, UA: NEGATIVE
Glucose, UA: NEGATIVE mg/dL
Ketones, POC UA: NEGATIVE mg/dL
Leukocytes, UA: NEGATIVE
Nitrite, UA: NEGATIVE
POC PROTEIN,UA: 30 — AB
Spec Grav, UA: 1.015 (ref 1.010–1.025)
Urobilinogen, UA: 0.2 E.U./dL
pH, UA: 6 (ref 5.0–8.0)

## 2018-04-27 MED ORDER — VALACYCLOVIR HCL 500 MG PO TABS: 500.0000 mg | ORAL_TABLET | Freq: Two times a day (BID) | ORAL | 5 refills | Status: AC

## 2018-04-27 MED ORDER — DILTIAZEM HCL ER COATED BEADS 120 MG PO CP24: 120.0000 mg | ORAL_CAPSULE | Freq: Every day | ORAL | 2 refills | Status: DC

## 2018-04-27 MED ORDER — RIZATRIPTAN BENZOATE 10 MG PO TBDP: 10.0000 mg | ORAL_TABLET | Freq: Three times a day (TID) | ORAL | 3 refills | Status: AC | PRN

## 2018-04-27 NOTE — Telephone Encounter (Signed)
Appointment pre-screening for COVID-19 completed.  Negative screening results. 

## 2018-04-27 NOTE — Progress Notes (Signed)
[facility]   Progress Note: General Provider: Mike Gip, FNP  SUBJECTIVE:   Pamela [name] is a 48 y.o. female who  has a past medical history of Chest pain, Degenerative joint disease, Herpes simplex, echocardiogram (2012), Hypertension, Migraine, Migraine without aura, with intractable migraine, so stated, without mention of status migrainosus (07/26/2013), Normal cardiac stress test (2012), Obesity, and Osteoarthritis of knee.. Patient presents today for Hypertension and Cough Patinet presents for follow up on medications. She states that she has increased fatigue and dizziness with taking toprol. She reports not taking cardizem QID as previously prescribed. She is lost to follow up with cardiology due to lack of insurance. She denies chest pain, SOB, or leg sweling. She states taht she d/c'd HCTZ due to fatigue.  Review of Systems  Constitutional: Positive for malaise/fatigue (due to medication).  HENT: Negative.   Eyes: Negative.   Respiratory: Negative.   Cardiovascular: Negative.   Gastrointestinal: Negative.   Genitourinary: Negative.   Musculoskeletal: Negative.   Skin: Negative.   Neurological: Negative.   Psychiatric/Behavioral: Negative.      OBJECTIVE: BP (!) 144/66   Pulse (!) 47   Temp 97.8 F (36.6 C)   Ht 5\' 2"  (1.575 m)   Wt 237 lb 3.2 oz (107.6 kg)   LMP 08/07/2010 (Approximate) Comment: Patient declined pregnancy test  SpO2 100%   BMI 43.38 kg/m   Wt Readings from Last 3 Encounters:  04/27/18 237 lb 3.2 oz (107.6 kg)  04/18/18 253 lb (114.8 kg)  03/17/18 235 lb (106.6 kg)     Physical Exam Vitals signs and nursing note reviewed.  Constitutional:      General: She is not in acute distress.    Appearance: She is well-developed.  HENT:     Head: Normocephalic and atraumatic.  Eyes:     Conjunctiva/sclera: Conjunctivae normal.     Pupils: Pupils are equal, round, and reactive to light.  Neck:      Musculoskeletal: Normal range of motion.  Cardiovascular:     Rate and Rhythm: Regular rhythm. Bradycardia present.     Heart sounds: Normal heart sounds.  Pulmonary:     Effort: Pulmonary effort is normal. No respiratory distress.     Breath sounds: Normal breath sounds.  Abdominal:     General: Bowel sounds are normal. There is no distension.     Palpations: Abdomen is soft.  Musculoskeletal: Normal range of motion.  Skin:    General: Skin is warm and dry.  Neurological:     Mental Status: She is alert and oriented to person, place, and time.  Psychiatric:        Mood and Affect: Mood normal.        Behavior: Behavior normal.        Thought Content: Thought content normal.        Judgment: Judgment normal.     ASSESSMENT/PLAN:  1. Essential hypertension Changed to cardizem CD for better compliance. D/c toprol.  - POCT URINALYSIS DIP (CLINITEK) - diltiazem (CARDIZEM CD) 120 MG 24 hr capsule; Take 1 capsule (120 mg total) by mouth daily.  Dispense: 30 capsule; Refill: 2 - Microalbumin, urine  2. HSV-2 (herpes simplex virus 2) infection - valACYclovir (VALTREX) 500 MG tablet; Take 1 tablet (500 mg total) by mouth 2 (two) times daily for 5 days.  Dispense: 10 tablet; Refill: 5  3. Other migraine without status migrainosus, not intractable - rizatriptan (MAXALT-MLT) 10 MG disintegrating tablet;  Take 1 tablet (10 mg total) by mouth 3 (three) times daily as needed for migraine. May repeat in 2 hours if needed  Dispense: 10 tablet; Refill: 3   Return in about 3 months (around 07/28/2018) for pap.    The patient was given clear instructions to go to ER or return to medical center if symptoms do not improve, worsen or new problems develop. The patient verbalized understanding and agreed with plan of care.   [name]. Pamela Lam, FNP-BC [facility] [address] [place], [place] [address] [phone]

## 2018-04-28 LAB — MICROALBUMIN, URINE: Microalbumin, Urine: 170.3 ug/mL

## 2018-05-18 ENCOUNTER — Ambulatory Visit: Payer: Self-pay | Admitting: Family Medicine

## 2018-06-01 ENCOUNTER — Other Ambulatory Visit: Payer: Self-pay

## 2018-06-01 ENCOUNTER — Encounter: Payer: Self-pay | Admitting: Family Medicine

## 2018-06-01 ENCOUNTER — Ambulatory Visit (INDEPENDENT_AMBULATORY_CARE_PROVIDER_SITE_OTHER): Payer: Self-pay | Admitting: Family Medicine

## 2018-06-01 DIAGNOSIS — J0100 Acute maxillary sinusitis, unspecified: Secondary | ICD-10-CM

## 2018-06-01 MED ORDER — SULFAMETHOXAZOLE-TRIMETHOPRIM 800-160 MG PO TABS: 1.0000 | ORAL_TABLET | Freq: Two times a day (BID) | ORAL | 0 refills | Status: AC

## 2018-06-01 MED ORDER — PREDNISONE 10 MG (21) PO TBPK: ORAL_TABLET | ORAL | 0 refills | Status: DC

## 2018-06-01 NOTE — Progress Notes (Signed)
  [facility]  Virtual Visit via Telephone Note  I connected with Pamela [name] on 06/01/18 at  9:00 AM EDT by telephone and verified that I am speaking with the correct person using two identifiers.   I discussed the limitations, risks, security and privacy concerns of performing an evaluation and management service by telephone and the availability of in person appointments. I also discussed with the patient that there may be a patient responsible charge related to this service. The patient expressed understanding and agreed to proceed.   History of Present Illness: Pamela [name]  has a past medical history of Chest pain, Degenerative joint disease, Herpes simplex, echocardiogram (2012), Hypertension, Migraine, Migraine without aura, with intractable migraine, so stated, without mention of status migrainosus (07/26/2013), Normal cardiac stress test (2012), Obesity, and Osteoarthritis of knee. Sinusitis  This is a recurrent problem. The current episode started 1 to 4 weeks ago. The problem has been gradually worsening since onset. There has been no fever. The pain is moderate. Associated symptoms include headaches and sinus pressure. Past treatments include oral decongestants, spray decongestants and saline sprays. The treatment provided mild relief.      Observations/Objective: Patient with regular voice tone, rate and rhythm. Speaking calmly and is in no apparent distress.    Assessment and Plan: 1. Subacute maxillary sinusitis Continue with sinus lavage.  - sulfamethoxazole-trimethoprim (BACTRIM DS) 800-160 MG tablet; Take 1 tablet by mouth 2 (two) times daily for 7 days.  Dispense: 14 tablet; Refill: 0 - predniSONE (STERAPRED UNI-PAK 21 TAB) 10 MG (21) TBPK tablet; Take as directed on package.  Dispense: 21 tablet; Refill: 0      Follow Up Instructions:  We discussed hand washing, using hand sanitizer when soap and water are  not available, only going out when absolutely necessary, and social distancing. Explained to patient that she is immunocompromised and will need to take precautions during this time.   I discussed the assessment and treatment plan with the patient. The patient was provided an opportunity to ask questions and all were answered. The patient agreed with the plan and demonstrated an understanding of the instructions.   The patient was advised to call back or seek an in-person evaluation if the symptoms worsen or if the condition fails to improve as anticipated.  I provided 10 minutes of non-face-to-face time during this encounter.  Ms. Andr L. [name], FNP-BC [facility] [address] [place], [place] [address] [phone]

## 2018-06-17 ENCOUNTER — Ambulatory Visit: Payer: Self-pay | Admitting: Family Medicine

## 2018-06-29 ENCOUNTER — Other Ambulatory Visit: Payer: Self-pay

## 2018-06-30 ENCOUNTER — Telehealth: Payer: Self-pay

## 2018-06-30 NOTE — Telephone Encounter (Signed)
Called and spoke with patient for COVID 19 Screening. Patient had no risk factors and is cleared to come into office for appointment. Thanks! 

## 2018-07-01 ENCOUNTER — Encounter: Payer: Self-pay | Admitting: Family Medicine

## 2018-07-01 ENCOUNTER — Other Ambulatory Visit: Payer: Self-pay

## 2018-07-01 ENCOUNTER — Ambulatory Visit (INDEPENDENT_AMBULATORY_CARE_PROVIDER_SITE_OTHER): Payer: Medicaid Other | Admitting: Family Medicine

## 2018-07-01 VITALS — BP 155/81 | HR 55 | Temp 98.7°F | Resp 16 | Ht 62.0 in | Wt 248.0 lb

## 2018-07-01 DIAGNOSIS — Z3202 Encounter for pregnancy test, result negative: Secondary | ICD-10-CM

## 2018-07-01 DIAGNOSIS — Z01419 Encounter for gynecological examination (general) (routine) without abnormal findings: Secondary | ICD-10-CM

## 2018-07-01 DIAGNOSIS — Z113 Encounter for screening for infections with a predominantly sexual mode of transmission: Secondary | ICD-10-CM

## 2018-07-01 DIAGNOSIS — Z0000 Encounter for general adult medical examination without abnormal findings: Secondary | ICD-10-CM | POA: Diagnosis not present

## 2018-07-01 DIAGNOSIS — I10 Essential (primary) hypertension: Secondary | ICD-10-CM

## 2018-07-01 LAB — POCT URINALYSIS DIPSTICK
Bilirubin, UA: NEGATIVE
Blood, UA: NEGATIVE
Glucose, UA: NEGATIVE
Ketones, UA: NEGATIVE
Leukocytes, UA: NEGATIVE
Nitrite, UA: NEGATIVE
Protein, UA: POSITIVE — AB
Spec Grav, UA: >=1.03 — AB (ref 1.010–1.025)
Urobilinogen, UA: 0.2 E.U./dL
pH, UA: 5 (ref 5.0–8.0)

## 2018-07-01 LAB — POCT URINE PREGNANCY: Preg Test, Ur: NEGATIVE

## 2018-07-01 MED ORDER — DILTIAZEM HCL ER COATED BEADS 120 MG PO CP24: 120.0000 mg | ORAL_CAPSULE | Freq: Every day | ORAL | 2 refills | Status: DC

## 2018-07-01 NOTE — Patient Instructions (Signed)

## 2018-07-01 NOTE — Progress Notes (Signed)
   [facility]  Annual GYN Examination Provider: Mike Gip, FNP   SUBJECTIVE: LEORIA PEGLOW is a 48 y.o. female who presents for her annual gynecological examination.  Patient reports not picking up antihypertensive medication after last visit.     GYNECOLOGICAL HISTORY: Patient's last menstrual period was 08/07/2010 (approximate). Contraception: post menopausal status Last Pap: 2014. Results were: abnormal Last mammogram: none.    The following portions of the patient's history were reviewed and updated as appropriate: allergies, current medications, past family history, past medical history, past social history, past surgical history and problem list.  REVIEW OF SYSTEMS: Review of Systems  Constitutional: Negative.   HENT: Negative.   Eyes: Negative.   Respiratory: Negative.   Cardiovascular: Negative.   Gastrointestinal: Negative.   Genitourinary: Negative.   Musculoskeletal: Negative.   Skin: Negative.   Neurological: Negative.   Psychiatric/Behavioral: Negative.       OBJECTIVE:  Physical Exam Genitourinary:     Pelvic exam was performed with patient in the lithotomy position.     Vulva, inguinal canal, urethra, bladder, uterus, right adnexa, left adnexa and rectum normal.     Vaginal discharge (thin and white) present.     Cervical exam comments: Elongation of the vaginal canal and body habitus created difficulty with obtaining specimen. .   Vitals signs and nursing note reviewed. Exam conducted with a chaperone present.      ASSESSMENT/PLAN:   1. Well woman exam with routine gynecological exam - Urinalysis Dipstick - POCT urine pregnancy - Pap IG, CT/NG NAA, and HPV (high risk) [facility] - HEP, RPR, HIV Panel  2. Routine screening for STI (sexually transmitted infection) - HEP, RPR, HIV Panel  3. Essential hypertension - diltiazem (CARDIZEM CD) 120 MG 24 hr capsule; Take 1 capsule (120 mg  total) by mouth daily.  Dispense: 30 capsule; Refill: 2     Education reviewed: low fat, low cholesterol diet, safe sex/STD prevention, self breast exams and weight bearing exercise. Follow up in: 6 months.    Return to care as scheduled and prn. Patient verbalized understanding and agreed with plan of care.   [name]. [name], FNP-BC [facility] [address] [place], [place] [address] [phone]

## 2018-07-02 LAB — HEP, RPR, HIV PANEL
HIV Screen 4th Generation wRfx: NONREACTIVE
Hepatitis B Surface Ag: NEGATIVE
RPR Ser Ql: NONREACTIVE

## 2018-07-05 ENCOUNTER — Telehealth: Payer: Self-pay

## 2018-07-05 MED ORDER — METOPROLOL SUCCINATE ER 25 MG PO TB24: 25.0000 mg | ORAL_TABLET | Freq: Every day | ORAL | 1 refills | Status: DC

## 2018-07-05 NOTE — Telephone Encounter (Signed)
Sent to the pharmacy

## 2018-07-05 NOTE — Telephone Encounter (Signed)
Pharmacy has faxed over refill request for metoprolol ER 25mg  once daily. However, this in not on current medication list. Please advise if this can be refilled? It was previously prescribed by an ER doctor.

## 2018-07-06 ENCOUNTER — Telehealth: Payer: Self-pay

## 2018-07-06 NOTE — Telephone Encounter (Signed)
Called and spoke with patient, advised that all labs are negative and that we are still waiting for pap results. Advised that we will call when this comes in. Patient verbalized understanding. Thanks !

## 2018-07-06 NOTE — Telephone Encounter (Signed)
-----   Message from Mike Gip, FNP sent at 07/06/2018  9:55 AM EDT ----- Pap is still pending. HIV, syphilis and hep b are negative.

## 2018-07-07 ENCOUNTER — Other Ambulatory Visit: Payer: Self-pay

## 2018-07-13 LAB — PAP IG, CT-NG NAA, HPV HIGH-RISK
Chlamydia, Nuc. Acid Amp: NEGATIVE
Gonococcus by Nucleic Acid Amp: NEGATIVE
HPV, high-risk: NEGATIVE

## 2018-07-14 ENCOUNTER — Telehealth: Payer: Self-pay

## 2018-07-14 NOTE — Telephone Encounter (Signed)
Called and spoke with patient, advised that pap was normal and she will not need another for 3 to 5 years. Asked that she continue to follow up yearly for her well woman exam and to continue monthly self breast exams. Thanks!

## 2018-07-14 NOTE — Progress Notes (Signed)
Your pap smear was normal. This means that you do not have any abnormal cells.  You will not need another pap smear for 3-5 years. Please continue to follow up yearly for your well woman exams and continue with monthly self breast exams. STI testing is negative.

## 2018-07-14 NOTE — Telephone Encounter (Signed)
-----   Message from Mike Gip, FNP sent at 07/14/2018  8:14 AM EDT ----- Your pap smear was normal. This means that you do not have any abnormal cells.  You will not need another pap smear for 3-5 years. Please continue to follow up yearly for your well woman exams and continue with monthly self breast exams. STI testing is negative.

## 2018-07-28 ENCOUNTER — Other Ambulatory Visit: Payer: Self-pay | Admitting: Family Medicine

## 2018-07-28 DIAGNOSIS — B009 Herpesviral infection, unspecified: Secondary | ICD-10-CM

## 2018-08-22 ENCOUNTER — Encounter: Payer: Medicaid Other | Admitting: Family Medicine

## 2018-08-22 ENCOUNTER — Other Ambulatory Visit: Payer: Self-pay

## 2018-08-26 ENCOUNTER — Telehealth: Payer: Self-pay | Admitting: Family Medicine

## 2018-08-26 ENCOUNTER — Other Ambulatory Visit: Payer: Self-pay

## 2018-08-26 DIAGNOSIS — I10 Essential (primary) hypertension: Secondary | ICD-10-CM

## 2018-08-26 MED ORDER — DILTIAZEM HCL ER COATED BEADS 120 MG PO CP24: 120.0000 mg | ORAL_CAPSULE | Freq: Every day | ORAL | 2 refills | Status: DC

## 2018-08-26 MED ORDER — METOPROLOL SUCCINATE ER 25 MG PO TB24: 25.0000 mg | ORAL_TABLET | Freq: Every day | ORAL | 1 refills | Status: DC

## 2018-08-26 NOTE — Telephone Encounter (Signed)
Called and spoke with patient. She has been out of one of her bp meds and bp has been elevated. I have refilled medication and asked that patient come in for bp check in 2 weeks. Patient verbalized understanding. Thanks!

## 2018-08-29 ENCOUNTER — Other Ambulatory Visit: Payer: Self-pay | Admitting: Family Medicine

## 2018-08-29 DIAGNOSIS — B009 Herpesviral infection, unspecified: Secondary | ICD-10-CM

## 2018-09-05 ENCOUNTER — Other Ambulatory Visit: Payer: Self-pay

## 2018-09-05 ENCOUNTER — Ambulatory Visit (INDEPENDENT_AMBULATORY_CARE_PROVIDER_SITE_OTHER): Payer: Self-pay | Admitting: Family Medicine

## 2018-09-05 VITALS — BP 142/60 | HR 60 | Ht 62.0 in | Wt 247.0 lb

## 2018-09-05 DIAGNOSIS — Z0130 Encounter for examination of blood pressure without abnormal findings: Secondary | ICD-10-CM

## 2018-09-05 NOTE — Progress Notes (Signed)
Patent advised per provider to stay on her current medication and to take as directed. Also to keep follow up appointment

## 2018-10-09 ENCOUNTER — Other Ambulatory Visit: Payer: Self-pay | Admitting: Family Medicine

## 2018-10-10 ENCOUNTER — Other Ambulatory Visit: Payer: Self-pay

## 2018-10-10 DIAGNOSIS — J3089 Other allergic rhinitis: Secondary | ICD-10-CM

## 2018-10-10 DIAGNOSIS — G43809 Other migraine, not intractable, without status migrainosus: Secondary | ICD-10-CM

## 2018-10-10 DIAGNOSIS — H938X3 Other specified disorders of ear, bilateral: Secondary | ICD-10-CM

## 2018-10-10 MED ORDER — METOPROLOL SUCCINATE ER 25 MG PO TB24: 25.0000 mg | ORAL_TABLET | Freq: Every day | ORAL | 0 refills | Status: DC

## 2018-10-10 MED ORDER — CETIRIZINE HCL 10 MG PO TABS: 10.0000 mg | ORAL_TABLET | Freq: Every day | ORAL | 11 refills | Status: AC

## 2018-10-19 ENCOUNTER — Encounter (HOSPITAL_COMMUNITY): Payer: Self-pay

## 2018-10-19 ENCOUNTER — Encounter (HOSPITAL_COMMUNITY): Payer: Self-pay | Admitting: *Deleted

## 2018-11-03 ENCOUNTER — Other Ambulatory Visit: Payer: Self-pay

## 2018-11-03 DIAGNOSIS — I10 Essential (primary) hypertension: Secondary | ICD-10-CM

## 2018-11-03 MED ORDER — DILTIAZEM HCL ER COATED BEADS 120 MG PO CP24: 120.0000 mg | ORAL_CAPSULE | Freq: Every day | ORAL | 2 refills | Status: AC

## 2018-11-28 ENCOUNTER — Other Ambulatory Visit: Payer: Self-pay

## 2018-11-28 ENCOUNTER — Telehealth: Payer: Self-pay | Admitting: Internal Medicine

## 2018-11-28 MED ORDER — METOPROLOL SUCCINATE ER 25 MG PO TB24: 25.0000 mg | ORAL_TABLET | Freq: Every day | ORAL | 0 refills | Status: AC

## 2018-11-29 ENCOUNTER — Other Ambulatory Visit: Payer: Self-pay | Admitting: Internal Medicine

## 2018-12-01 NOTE — Telephone Encounter (Signed)
done

## 2019-01-02 ENCOUNTER — Ambulatory Visit: Payer: Medicaid Other | Admitting: Family Medicine

## 2019-02-21 ENCOUNTER — Ambulatory Visit (INDEPENDENT_AMBULATORY_CARE_PROVIDER_SITE_OTHER): Payer: BC Managed Care – PPO | Admitting: Psychology

## 2019-02-21 DIAGNOSIS — F4310 Post-traumatic stress disorder, unspecified: Secondary | ICD-10-CM | POA: Diagnosis not present

## 2019-02-25 IMAGING — CR DG LUMBAR SPINE COMPLETE 4+V
5 series · 5 of 5 positions shown · non-contrast
Comparison: Lumbar spine series dated October 07, 2005

CLINICAL DATA: Status post fall down steps 2 week ago with
persistent low back pain radiating into the left posterior mid
thigh.

EXAM:
LUMBAR SPINE - COMPLETE 4+ VIEW

[t l-spine a.p.]
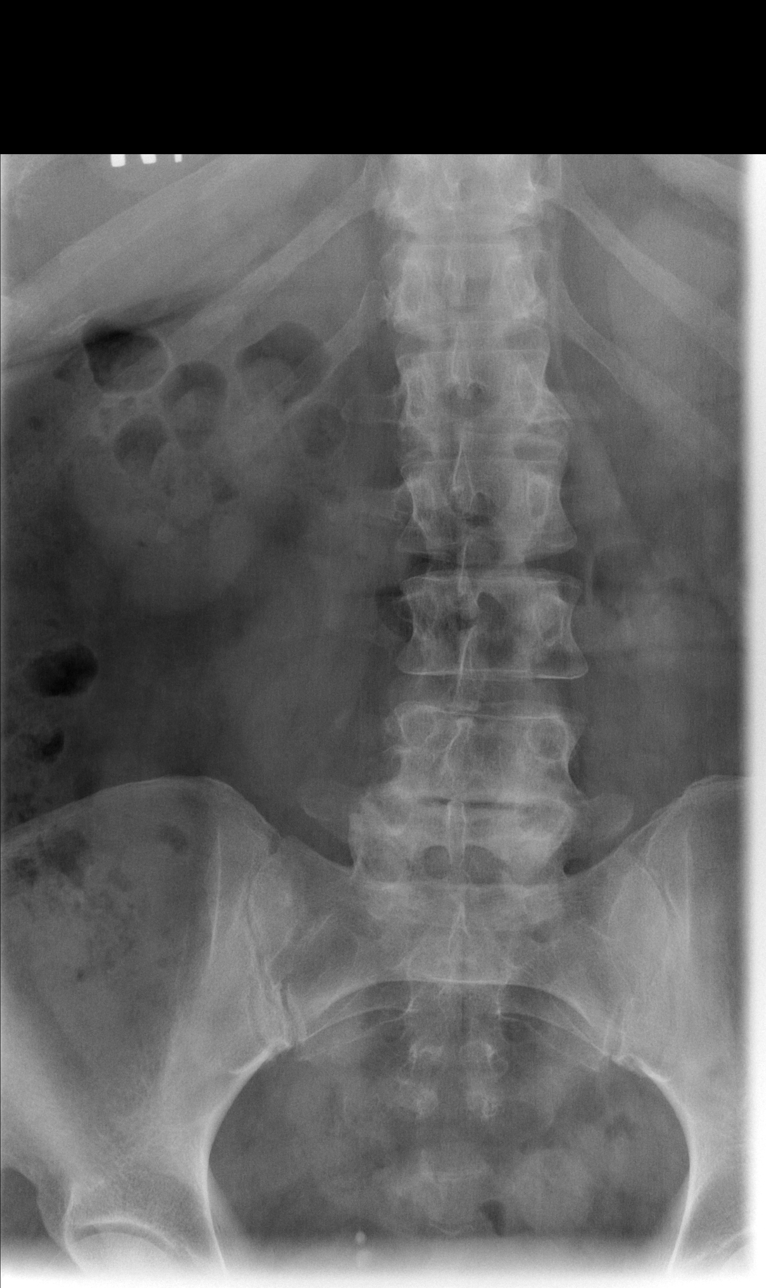

[t l-spine oblique exposure * (1 of 2)]
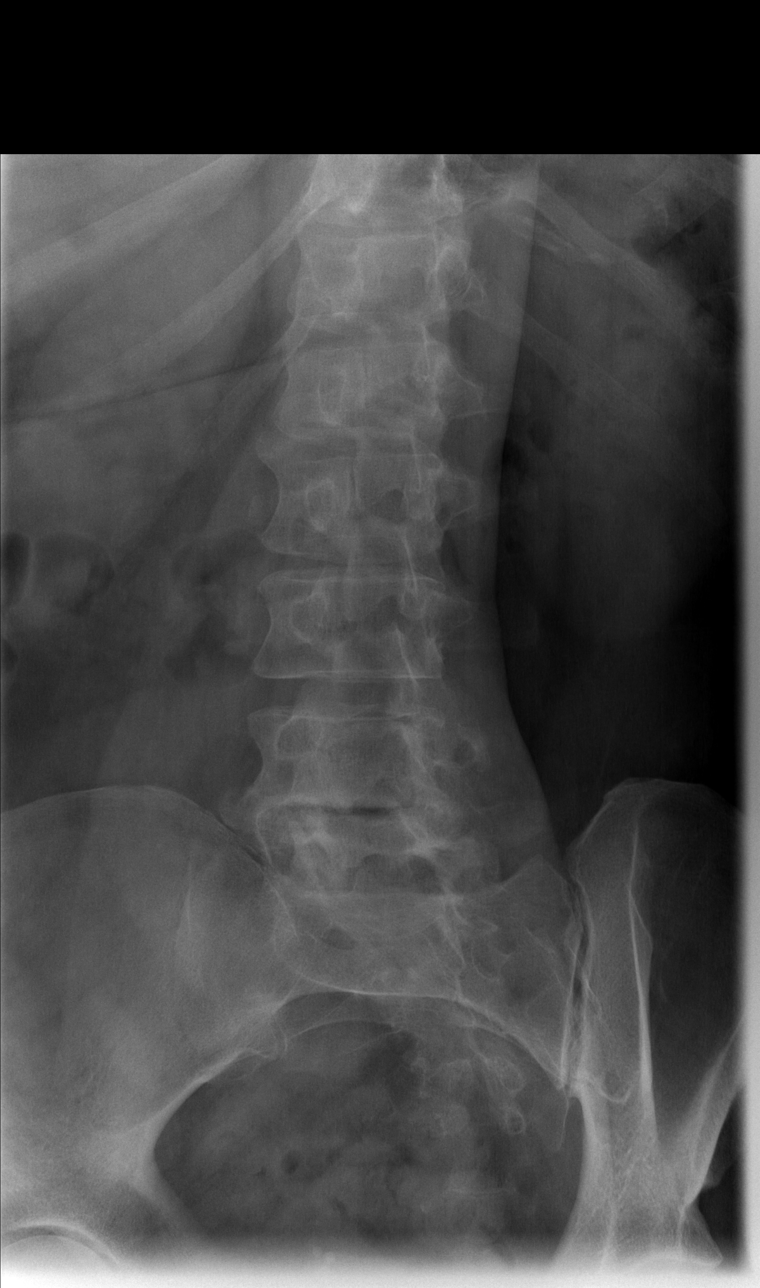

[t l-spine oblique exposure * (2 of 2)]
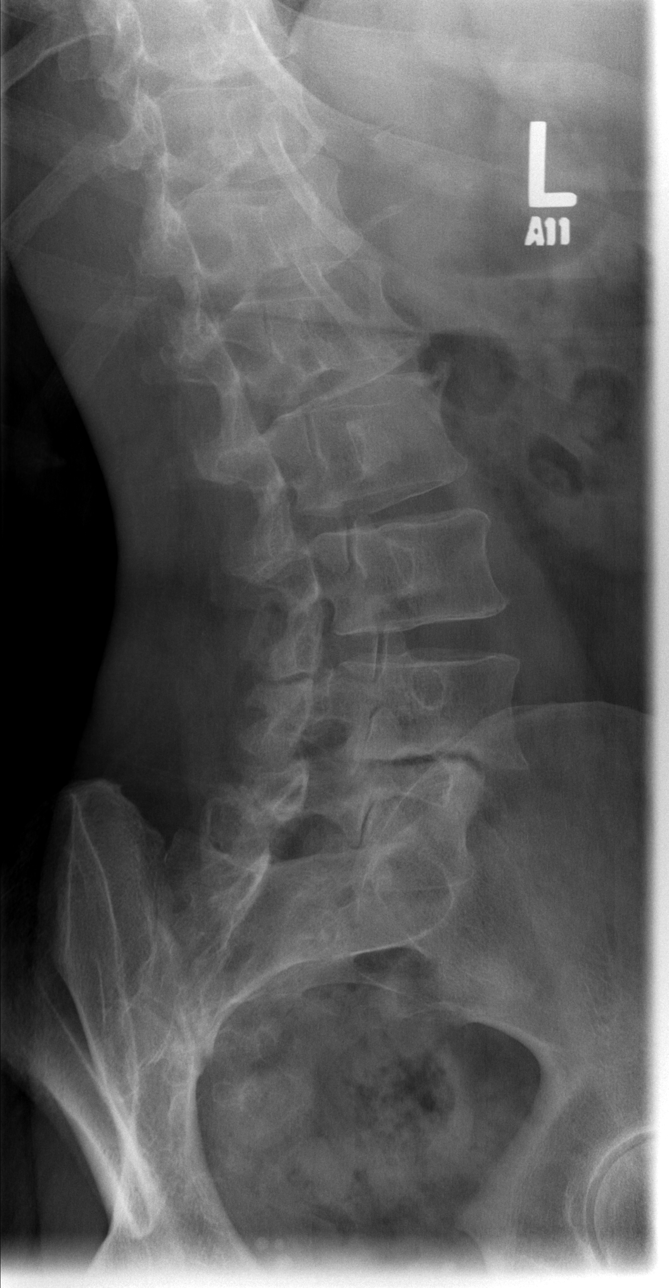

[t l-spine lat]
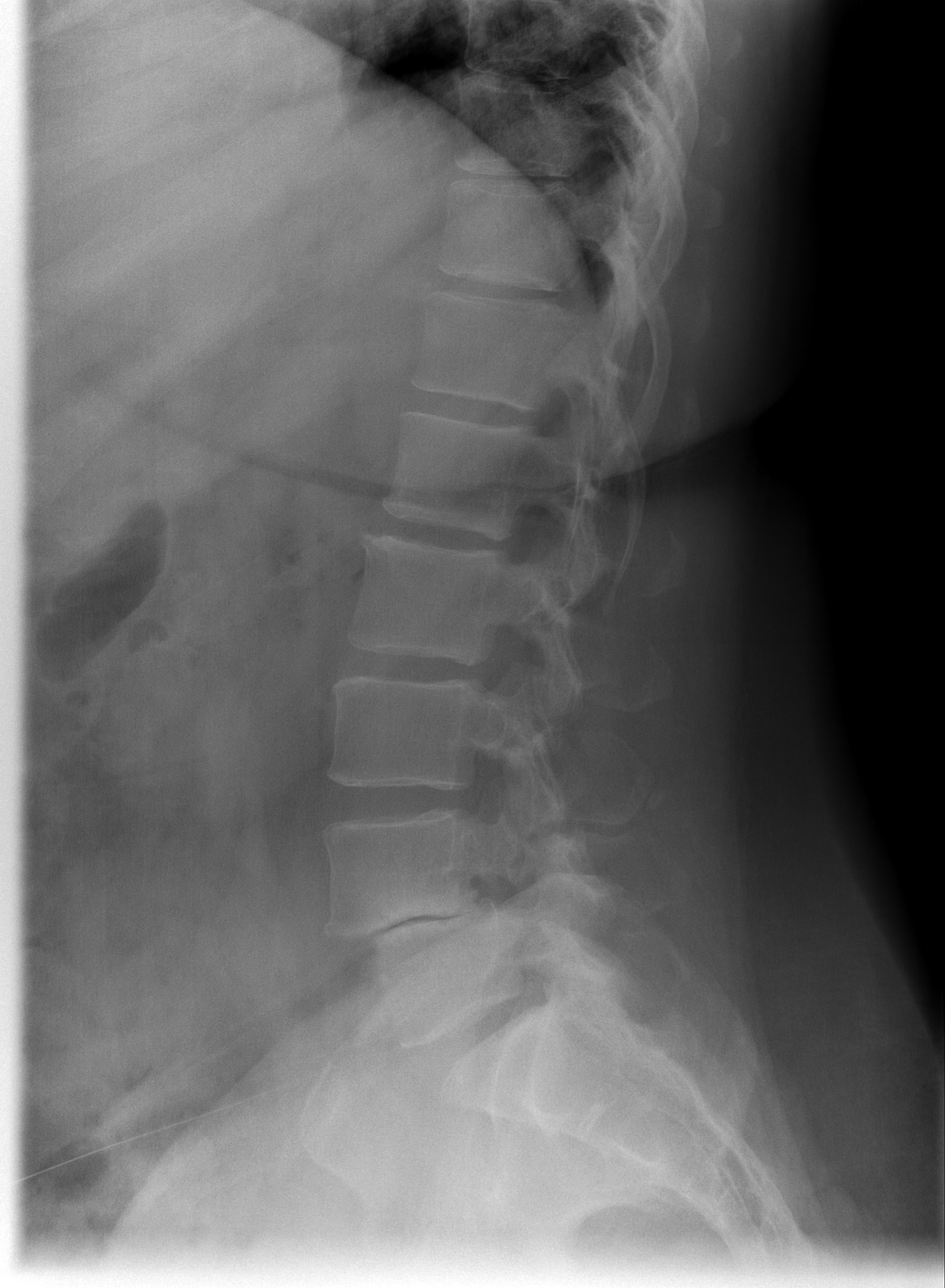

[t l-spine l5-s1 spot]
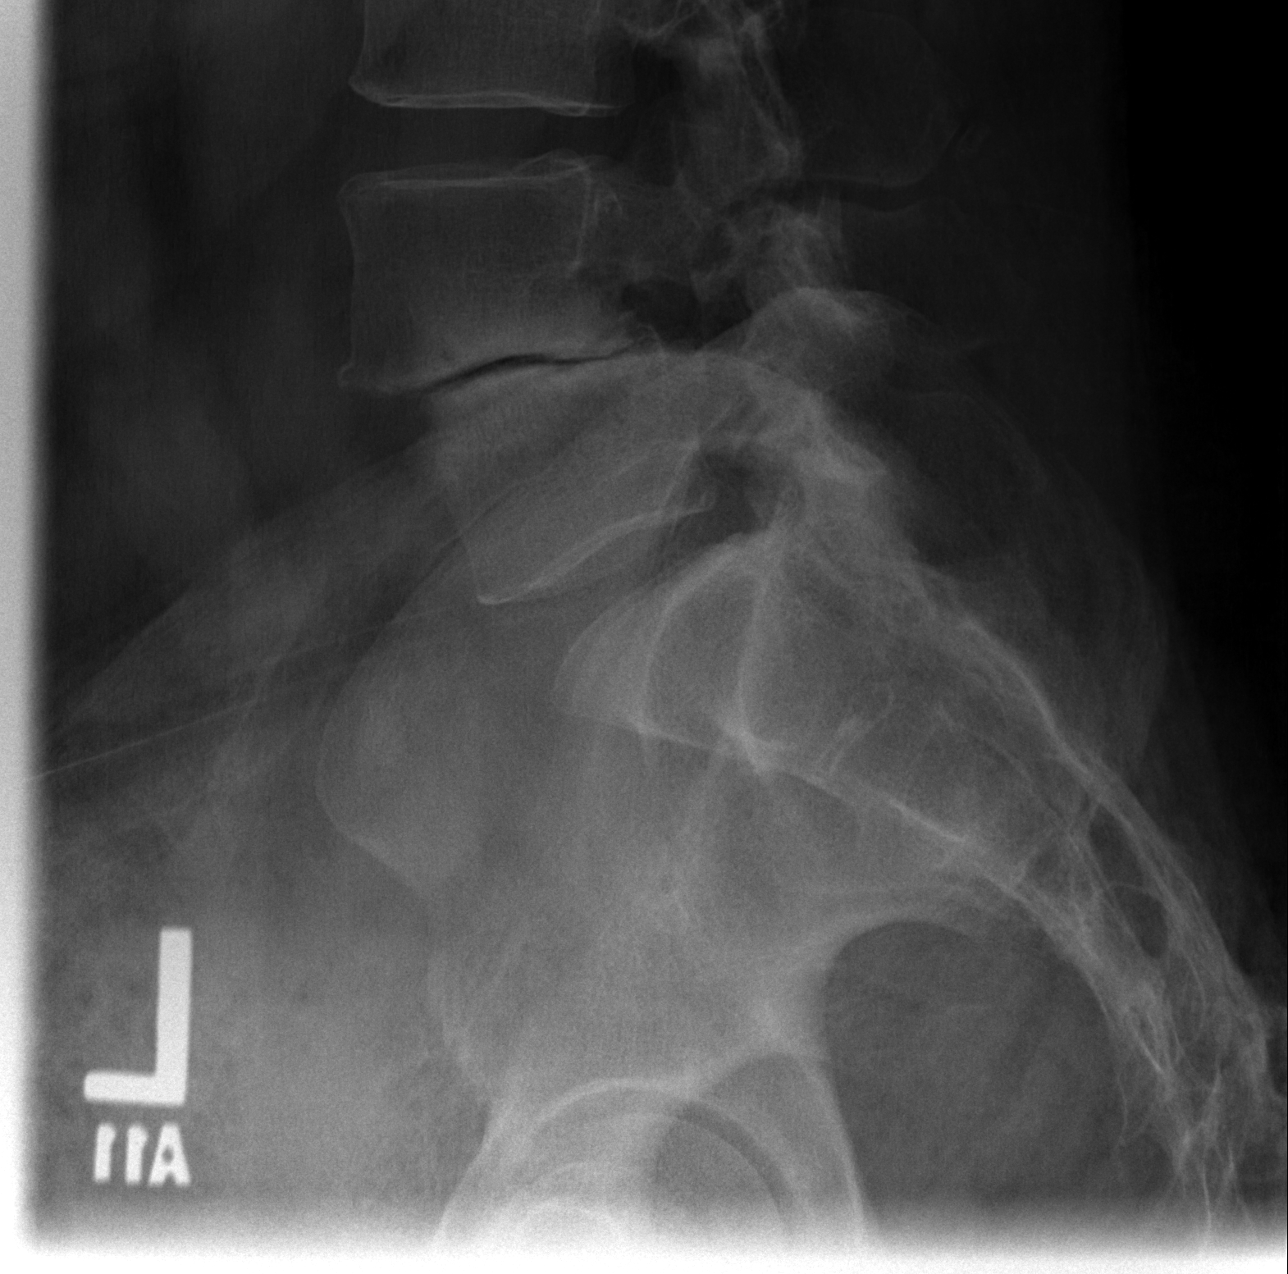

[5 of 5 positions shown; findings below may reference images not displayed]

FINDINGS: The lumbar vertebral bodies are preserved in height. There is
degenerative disc space narrowing at L4-5. There is grade 1
anterolisthesis of L4 with respect L5 amounting to approximately 8
mm. Bilateral pars defects at L4 are present.
IMPRESSION: Grade 1 anterolisthesis of L4 with respect L5 due to bilateral pars
defects and moderate to severe degenerative disc change. This has
progressed since the previous study. No acute compression fracture.
No significant disc abnormality elsewhere though there is mild
degenerative disc space narrowing at L1-2.

## 2019-03-07 ENCOUNTER — Ambulatory Visit (INDEPENDENT_AMBULATORY_CARE_PROVIDER_SITE_OTHER): Payer: BC Managed Care – PPO | Admitting: Psychology

## 2019-03-07 DIAGNOSIS — F4310 Post-traumatic stress disorder, unspecified: Secondary | ICD-10-CM

## 2019-03-14 ENCOUNTER — Ambulatory Visit (INDEPENDENT_AMBULATORY_CARE_PROVIDER_SITE_OTHER): Payer: BC Managed Care – PPO | Admitting: Psychology

## 2019-03-14 DIAGNOSIS — F4310 Post-traumatic stress disorder, unspecified: Secondary | ICD-10-CM

## 2019-03-22 ENCOUNTER — Ambulatory Visit (INDEPENDENT_AMBULATORY_CARE_PROVIDER_SITE_OTHER): Payer: BC Managed Care – PPO | Admitting: Psychology

## 2019-03-22 DIAGNOSIS — F4310 Post-traumatic stress disorder, unspecified: Secondary | ICD-10-CM

## 2019-03-29 ENCOUNTER — Ambulatory Visit (INDEPENDENT_AMBULATORY_CARE_PROVIDER_SITE_OTHER): Payer: BC Managed Care – PPO | Admitting: Psychology

## 2019-03-29 DIAGNOSIS — F4310 Post-traumatic stress disorder, unspecified: Secondary | ICD-10-CM

## 2019-04-04 ENCOUNTER — Ambulatory Visit (INDEPENDENT_AMBULATORY_CARE_PROVIDER_SITE_OTHER): Payer: BC Managed Care – PPO | Admitting: Psychology

## 2019-04-04 DIAGNOSIS — F4310 Post-traumatic stress disorder, unspecified: Secondary | ICD-10-CM | POA: Diagnosis not present

## 2019-04-12 ENCOUNTER — Ambulatory Visit (INDEPENDENT_AMBULATORY_CARE_PROVIDER_SITE_OTHER): Payer: BC Managed Care – PPO | Admitting: Psychology

## 2019-04-12 DIAGNOSIS — F4310 Post-traumatic stress disorder, unspecified: Secondary | ICD-10-CM

## 2019-04-27 ENCOUNTER — Ambulatory Visit (INDEPENDENT_AMBULATORY_CARE_PROVIDER_SITE_OTHER): Payer: BC Managed Care – PPO | Admitting: Psychology

## 2019-04-27 DIAGNOSIS — F4310 Post-traumatic stress disorder, unspecified: Secondary | ICD-10-CM

## 2019-05-03 ENCOUNTER — Ambulatory Visit: Payer: BC Managed Care – PPO | Admitting: Psychology

## 2019-05-09 ENCOUNTER — Ambulatory Visit (INDEPENDENT_AMBULATORY_CARE_PROVIDER_SITE_OTHER): Payer: BC Managed Care – PPO | Admitting: Psychology

## 2019-05-09 DIAGNOSIS — F4310 Post-traumatic stress disorder, unspecified: Secondary | ICD-10-CM | POA: Diagnosis not present

## 2019-05-16 ENCOUNTER — Ambulatory Visit (INDEPENDENT_AMBULATORY_CARE_PROVIDER_SITE_OTHER): Payer: BC Managed Care – PPO | Admitting: Psychology

## 2019-05-16 DIAGNOSIS — F4310 Post-traumatic stress disorder, unspecified: Secondary | ICD-10-CM

## 2019-05-23 ENCOUNTER — Ambulatory Visit (INDEPENDENT_AMBULATORY_CARE_PROVIDER_SITE_OTHER): Payer: BC Managed Care – PPO | Admitting: Psychology

## 2019-05-23 DIAGNOSIS — F4310 Post-traumatic stress disorder, unspecified: Secondary | ICD-10-CM | POA: Diagnosis not present

## 2019-06-01 ENCOUNTER — Ambulatory Visit (INDEPENDENT_AMBULATORY_CARE_PROVIDER_SITE_OTHER): Payer: BC Managed Care – PPO | Admitting: Psychology

## 2019-06-01 DIAGNOSIS — F4310 Post-traumatic stress disorder, unspecified: Secondary | ICD-10-CM | POA: Diagnosis not present

## 2019-06-07 ENCOUNTER — Ambulatory Visit (INDEPENDENT_AMBULATORY_CARE_PROVIDER_SITE_OTHER): Payer: BC Managed Care – PPO | Admitting: Psychology

## 2019-06-07 DIAGNOSIS — F4310 Post-traumatic stress disorder, unspecified: Secondary | ICD-10-CM | POA: Diagnosis not present

## 2019-06-13 ENCOUNTER — Ambulatory Visit (INDEPENDENT_AMBULATORY_CARE_PROVIDER_SITE_OTHER): Payer: BC Managed Care – PPO | Admitting: Psychology

## 2019-06-13 DIAGNOSIS — F4310 Post-traumatic stress disorder, unspecified: Secondary | ICD-10-CM | POA: Diagnosis not present

## 2019-06-21 ENCOUNTER — Ambulatory Visit (INDEPENDENT_AMBULATORY_CARE_PROVIDER_SITE_OTHER): Payer: BC Managed Care – PPO | Admitting: Psychology

## 2019-06-21 DIAGNOSIS — F4310 Post-traumatic stress disorder, unspecified: Secondary | ICD-10-CM | POA: Diagnosis not present

## 2019-06-27 ENCOUNTER — Ambulatory Visit (INDEPENDENT_AMBULATORY_CARE_PROVIDER_SITE_OTHER): Payer: BC Managed Care – PPO | Admitting: Psychology

## 2019-06-27 DIAGNOSIS — F4310 Post-traumatic stress disorder, unspecified: Secondary | ICD-10-CM

## 2019-07-05 ENCOUNTER — Ambulatory Visit (INDEPENDENT_AMBULATORY_CARE_PROVIDER_SITE_OTHER): Payer: BC Managed Care – PPO | Admitting: Psychology

## 2019-07-05 DIAGNOSIS — F4310 Post-traumatic stress disorder, unspecified: Secondary | ICD-10-CM

## 2019-07-19 ENCOUNTER — Ambulatory Visit (INDEPENDENT_AMBULATORY_CARE_PROVIDER_SITE_OTHER): Payer: BC Managed Care – PPO | Admitting: Psychology

## 2019-07-19 DIAGNOSIS — F4310 Post-traumatic stress disorder, unspecified: Secondary | ICD-10-CM

## 2019-07-25 ENCOUNTER — Ambulatory Visit (INDEPENDENT_AMBULATORY_CARE_PROVIDER_SITE_OTHER): Payer: BC Managed Care – PPO | Admitting: Psychology

## 2019-07-25 DIAGNOSIS — F4310 Post-traumatic stress disorder, unspecified: Secondary | ICD-10-CM

## 2019-08-02 ENCOUNTER — Ambulatory Visit (INDEPENDENT_AMBULATORY_CARE_PROVIDER_SITE_OTHER): Payer: BC Managed Care – PPO | Admitting: Psychology

## 2019-08-02 DIAGNOSIS — F4310 Post-traumatic stress disorder, unspecified: Secondary | ICD-10-CM

## 2019-08-08 ENCOUNTER — Ambulatory Visit: Payer: BC Managed Care – PPO | Admitting: Psychology

## 2019-08-16 ENCOUNTER — Ambulatory Visit (INDEPENDENT_AMBULATORY_CARE_PROVIDER_SITE_OTHER): Payer: BC Managed Care – PPO | Admitting: Psychology

## 2019-08-16 DIAGNOSIS — F4310 Post-traumatic stress disorder, unspecified: Secondary | ICD-10-CM | POA: Diagnosis not present

## 2019-08-22 ENCOUNTER — Ambulatory Visit (INDEPENDENT_AMBULATORY_CARE_PROVIDER_SITE_OTHER): Payer: BC Managed Care – PPO | Admitting: Psychology

## 2019-08-22 DIAGNOSIS — F4310 Post-traumatic stress disorder, unspecified: Secondary | ICD-10-CM

## 2019-08-30 ENCOUNTER — Ambulatory Visit (INDEPENDENT_AMBULATORY_CARE_PROVIDER_SITE_OTHER): Payer: BC Managed Care – PPO | Admitting: Psychology

## 2019-08-30 DIAGNOSIS — F4310 Post-traumatic stress disorder, unspecified: Secondary | ICD-10-CM

## 2019-09-13 ENCOUNTER — Ambulatory Visit (INDEPENDENT_AMBULATORY_CARE_PROVIDER_SITE_OTHER): Payer: BC Managed Care – PPO | Admitting: Psychology

## 2019-09-13 DIAGNOSIS — F4310 Post-traumatic stress disorder, unspecified: Secondary | ICD-10-CM

## 2019-09-19 ENCOUNTER — Ambulatory Visit (INDEPENDENT_AMBULATORY_CARE_PROVIDER_SITE_OTHER): Payer: BC Managed Care – PPO | Admitting: Psychology

## 2019-09-19 DIAGNOSIS — F4310 Post-traumatic stress disorder, unspecified: Secondary | ICD-10-CM

## 2019-09-25 ENCOUNTER — Ambulatory Visit (INDEPENDENT_AMBULATORY_CARE_PROVIDER_SITE_OTHER): Payer: BC Managed Care – PPO | Admitting: Psychology

## 2019-09-25 DIAGNOSIS — F4310 Post-traumatic stress disorder, unspecified: Secondary | ICD-10-CM

## 2019-10-11 ENCOUNTER — Ambulatory Visit (INDEPENDENT_AMBULATORY_CARE_PROVIDER_SITE_OTHER): Payer: BC Managed Care – PPO | Admitting: Psychology

## 2019-10-11 DIAGNOSIS — F4310 Post-traumatic stress disorder, unspecified: Secondary | ICD-10-CM

## 2019-10-24 ENCOUNTER — Ambulatory Visit (INDEPENDENT_AMBULATORY_CARE_PROVIDER_SITE_OTHER): Payer: BC Managed Care – PPO | Admitting: Psychology

## 2019-10-24 DIAGNOSIS — F4310 Post-traumatic stress disorder, unspecified: Secondary | ICD-10-CM

## 2019-10-25 ENCOUNTER — Other Ambulatory Visit: Payer: Self-pay | Admitting: Family Medicine

## 2019-10-25 DIAGNOSIS — G43809 Other migraine, not intractable, without status migrainosus: Secondary | ICD-10-CM

## 2019-10-25 DIAGNOSIS — H938X3 Other specified disorders of ear, bilateral: Secondary | ICD-10-CM

## 2019-10-25 DIAGNOSIS — J3089 Other allergic rhinitis: Secondary | ICD-10-CM

## 2019-10-31 ENCOUNTER — Ambulatory Visit (INDEPENDENT_AMBULATORY_CARE_PROVIDER_SITE_OTHER): Payer: BC Managed Care – PPO | Admitting: Psychology

## 2019-10-31 DIAGNOSIS — F4310 Post-traumatic stress disorder, unspecified: Secondary | ICD-10-CM

## 2019-11-07 ENCOUNTER — Ambulatory Visit (INDEPENDENT_AMBULATORY_CARE_PROVIDER_SITE_OTHER): Payer: BC Managed Care – PPO | Admitting: Psychology

## 2019-11-07 DIAGNOSIS — F4310 Post-traumatic stress disorder, unspecified: Secondary | ICD-10-CM

## 2019-11-14 ENCOUNTER — Ambulatory Visit (INDEPENDENT_AMBULATORY_CARE_PROVIDER_SITE_OTHER): Payer: BC Managed Care – PPO | Admitting: Psychology

## 2019-11-14 DIAGNOSIS — F4310 Post-traumatic stress disorder, unspecified: Secondary | ICD-10-CM

## 2019-11-21 ENCOUNTER — Ambulatory Visit (INDEPENDENT_AMBULATORY_CARE_PROVIDER_SITE_OTHER): Payer: BC Managed Care – PPO | Admitting: Psychology

## 2019-11-21 DIAGNOSIS — F4310 Post-traumatic stress disorder, unspecified: Secondary | ICD-10-CM

## 2019-11-28 ENCOUNTER — Ambulatory Visit: Payer: BC Managed Care – PPO | Admitting: Psychology

## 2019-12-05 ENCOUNTER — Ambulatory Visit (INDEPENDENT_AMBULATORY_CARE_PROVIDER_SITE_OTHER): Payer: BC Managed Care – PPO | Admitting: Psychology

## 2019-12-05 DIAGNOSIS — F4310 Post-traumatic stress disorder, unspecified: Secondary | ICD-10-CM

## 2019-12-12 ENCOUNTER — Ambulatory Visit (INDEPENDENT_AMBULATORY_CARE_PROVIDER_SITE_OTHER): Payer: BC Managed Care – PPO | Admitting: Psychology

## 2019-12-12 DIAGNOSIS — F4310 Post-traumatic stress disorder, unspecified: Secondary | ICD-10-CM | POA: Diagnosis not present

## 2019-12-19 ENCOUNTER — Ambulatory Visit (INDEPENDENT_AMBULATORY_CARE_PROVIDER_SITE_OTHER): Payer: BC Managed Care – PPO | Admitting: Psychology

## 2019-12-19 DIAGNOSIS — F4310 Post-traumatic stress disorder, unspecified: Secondary | ICD-10-CM | POA: Diagnosis not present

## 2019-12-26 ENCOUNTER — Ambulatory Visit (INDEPENDENT_AMBULATORY_CARE_PROVIDER_SITE_OTHER): Payer: BC Managed Care – PPO | Admitting: Psychology

## 2019-12-26 DIAGNOSIS — F4310 Post-traumatic stress disorder, unspecified: Secondary | ICD-10-CM | POA: Diagnosis not present

## 2020-01-02 ENCOUNTER — Ambulatory Visit (INDEPENDENT_AMBULATORY_CARE_PROVIDER_SITE_OTHER): Payer: BC Managed Care – PPO | Admitting: Psychology

## 2020-01-02 DIAGNOSIS — F4310 Post-traumatic stress disorder, unspecified: Secondary | ICD-10-CM | POA: Diagnosis not present

## 2020-01-09 ENCOUNTER — Ambulatory Visit: Payer: BC Managed Care – PPO | Admitting: Psychology

## 2020-01-16 ENCOUNTER — Ambulatory Visit (INDEPENDENT_AMBULATORY_CARE_PROVIDER_SITE_OTHER): Payer: BC Managed Care – PPO | Admitting: Psychology

## 2020-01-16 DIAGNOSIS — F4310 Post-traumatic stress disorder, unspecified: Secondary | ICD-10-CM | POA: Diagnosis not present

## 2020-01-23 ENCOUNTER — Ambulatory Visit (INDEPENDENT_AMBULATORY_CARE_PROVIDER_SITE_OTHER): Payer: BC Managed Care – PPO | Admitting: Psychology

## 2020-01-23 DIAGNOSIS — F4310 Post-traumatic stress disorder, unspecified: Secondary | ICD-10-CM

## 2020-01-30 ENCOUNTER — Ambulatory Visit: Payer: BC Managed Care – PPO | Admitting: Psychology

## 2020-01-31 ENCOUNTER — Ambulatory Visit (INDEPENDENT_AMBULATORY_CARE_PROVIDER_SITE_OTHER): Payer: BC Managed Care – PPO | Admitting: Psychology

## 2020-01-31 DIAGNOSIS — F4310 Post-traumatic stress disorder, unspecified: Secondary | ICD-10-CM

## 2020-02-06 ENCOUNTER — Ambulatory Visit (INDEPENDENT_AMBULATORY_CARE_PROVIDER_SITE_OTHER): Payer: BC Managed Care – PPO | Admitting: Psychology

## 2020-02-06 DIAGNOSIS — F4310 Post-traumatic stress disorder, unspecified: Secondary | ICD-10-CM

## 2020-02-13 ENCOUNTER — Ambulatory Visit (INDEPENDENT_AMBULATORY_CARE_PROVIDER_SITE_OTHER): Payer: BC Managed Care – PPO | Admitting: Psychology

## 2020-02-13 DIAGNOSIS — F4310 Post-traumatic stress disorder, unspecified: Secondary | ICD-10-CM

## 2020-02-20 ENCOUNTER — Ambulatory Visit (INDEPENDENT_AMBULATORY_CARE_PROVIDER_SITE_OTHER): Payer: BC Managed Care – PPO | Admitting: Psychology

## 2020-02-20 DIAGNOSIS — F4310 Post-traumatic stress disorder, unspecified: Secondary | ICD-10-CM

## 2020-02-27 ENCOUNTER — Ambulatory Visit (INDEPENDENT_AMBULATORY_CARE_PROVIDER_SITE_OTHER): Payer: BC Managed Care – PPO | Admitting: Psychology

## 2020-02-27 DIAGNOSIS — F4310 Post-traumatic stress disorder, unspecified: Secondary | ICD-10-CM

## 2020-03-05 ENCOUNTER — Ambulatory Visit (INDEPENDENT_AMBULATORY_CARE_PROVIDER_SITE_OTHER): Payer: BC Managed Care – PPO | Admitting: Psychology

## 2020-03-05 DIAGNOSIS — F4310 Post-traumatic stress disorder, unspecified: Secondary | ICD-10-CM | POA: Diagnosis not present

## 2020-03-12 ENCOUNTER — Ambulatory Visit: Payer: BC Managed Care – PPO | Admitting: Psychology

## 2020-03-19 ENCOUNTER — Ambulatory Visit (INDEPENDENT_AMBULATORY_CARE_PROVIDER_SITE_OTHER): Payer: BC Managed Care – PPO | Admitting: Psychology

## 2020-03-19 DIAGNOSIS — F4310 Post-traumatic stress disorder, unspecified: Secondary | ICD-10-CM

## 2020-03-26 ENCOUNTER — Ambulatory Visit (INDEPENDENT_AMBULATORY_CARE_PROVIDER_SITE_OTHER): Payer: BC Managed Care – PPO | Admitting: Psychology

## 2020-03-26 DIAGNOSIS — F4310 Post-traumatic stress disorder, unspecified: Secondary | ICD-10-CM | POA: Diagnosis not present

## 2020-04-02 ENCOUNTER — Ambulatory Visit (INDEPENDENT_AMBULATORY_CARE_PROVIDER_SITE_OTHER): Payer: BC Managed Care – PPO | Admitting: Psychology

## 2020-04-02 DIAGNOSIS — F4310 Post-traumatic stress disorder, unspecified: Secondary | ICD-10-CM | POA: Diagnosis not present

## 2020-04-09 ENCOUNTER — Ambulatory Visit (INDEPENDENT_AMBULATORY_CARE_PROVIDER_SITE_OTHER): Payer: BC Managed Care – PPO | Admitting: Psychology

## 2020-04-09 DIAGNOSIS — F4310 Post-traumatic stress disorder, unspecified: Secondary | ICD-10-CM | POA: Diagnosis not present

## 2020-04-16 ENCOUNTER — Ambulatory Visit: Payer: BC Managed Care – PPO | Admitting: Psychology

## 2020-04-18 ENCOUNTER — Ambulatory Visit (INDEPENDENT_AMBULATORY_CARE_PROVIDER_SITE_OTHER): Payer: BC Managed Care – PPO | Admitting: Psychology

## 2020-04-18 DIAGNOSIS — F4310 Post-traumatic stress disorder, unspecified: Secondary | ICD-10-CM | POA: Diagnosis not present

## 2020-04-23 ENCOUNTER — Ambulatory Visit (INDEPENDENT_AMBULATORY_CARE_PROVIDER_SITE_OTHER): Payer: BC Managed Care – PPO | Admitting: Psychology

## 2020-04-23 DIAGNOSIS — F4310 Post-traumatic stress disorder, unspecified: Secondary | ICD-10-CM | POA: Diagnosis not present

## 2020-04-30 ENCOUNTER — Ambulatory Visit (INDEPENDENT_AMBULATORY_CARE_PROVIDER_SITE_OTHER): Payer: BC Managed Care – PPO | Admitting: Psychology

## 2020-04-30 DIAGNOSIS — F4310 Post-traumatic stress disorder, unspecified: Secondary | ICD-10-CM

## 2020-05-07 ENCOUNTER — Ambulatory Visit (INDEPENDENT_AMBULATORY_CARE_PROVIDER_SITE_OTHER): Payer: BC Managed Care – PPO | Admitting: Psychology

## 2020-05-07 DIAGNOSIS — F4310 Post-traumatic stress disorder, unspecified: Secondary | ICD-10-CM

## 2020-05-14 ENCOUNTER — Ambulatory Visit (INDEPENDENT_AMBULATORY_CARE_PROVIDER_SITE_OTHER): Payer: BC Managed Care – PPO | Admitting: Psychology

## 2020-05-14 DIAGNOSIS — F4310 Post-traumatic stress disorder, unspecified: Secondary | ICD-10-CM

## 2020-05-21 ENCOUNTER — Ambulatory Visit (INDEPENDENT_AMBULATORY_CARE_PROVIDER_SITE_OTHER): Payer: BC Managed Care – PPO | Admitting: Psychology

## 2020-05-21 DIAGNOSIS — F4310 Post-traumatic stress disorder, unspecified: Secondary | ICD-10-CM | POA: Diagnosis not present

## 2020-05-28 ENCOUNTER — Ambulatory Visit (INDEPENDENT_AMBULATORY_CARE_PROVIDER_SITE_OTHER): Payer: BC Managed Care – PPO | Admitting: Psychology

## 2020-05-28 DIAGNOSIS — F4310 Post-traumatic stress disorder, unspecified: Secondary | ICD-10-CM

## 2020-06-04 ENCOUNTER — Ambulatory Visit (INDEPENDENT_AMBULATORY_CARE_PROVIDER_SITE_OTHER): Payer: BC Managed Care – PPO | Admitting: Psychology

## 2020-06-04 DIAGNOSIS — F4310 Post-traumatic stress disorder, unspecified: Secondary | ICD-10-CM

## 2020-06-11 ENCOUNTER — Ambulatory Visit (INDEPENDENT_AMBULATORY_CARE_PROVIDER_SITE_OTHER): Payer: BC Managed Care – PPO | Admitting: Psychology

## 2020-06-11 DIAGNOSIS — F4310 Post-traumatic stress disorder, unspecified: Secondary | ICD-10-CM

## 2020-06-18 ENCOUNTER — Ambulatory Visit (INDEPENDENT_AMBULATORY_CARE_PROVIDER_SITE_OTHER): Payer: BC Managed Care – PPO | Admitting: Psychology

## 2020-06-18 DIAGNOSIS — F4310 Post-traumatic stress disorder, unspecified: Secondary | ICD-10-CM

## 2020-06-25 ENCOUNTER — Ambulatory Visit (INDEPENDENT_AMBULATORY_CARE_PROVIDER_SITE_OTHER): Payer: BC Managed Care – PPO | Admitting: Psychology

## 2020-06-25 DIAGNOSIS — F4310 Post-traumatic stress disorder, unspecified: Secondary | ICD-10-CM

## 2020-07-02 ENCOUNTER — Ambulatory Visit: Payer: BC Managed Care – PPO | Admitting: Psychology

## 2020-07-04 ENCOUNTER — Ambulatory Visit (INDEPENDENT_AMBULATORY_CARE_PROVIDER_SITE_OTHER): Payer: BC Managed Care – PPO | Admitting: Psychology

## 2020-07-04 DIAGNOSIS — F4310 Post-traumatic stress disorder, unspecified: Secondary | ICD-10-CM | POA: Diagnosis not present

## 2020-07-09 ENCOUNTER — Ambulatory Visit (INDEPENDENT_AMBULATORY_CARE_PROVIDER_SITE_OTHER): Payer: BC Managed Care – PPO | Admitting: Psychology

## 2020-07-09 DIAGNOSIS — F4310 Post-traumatic stress disorder, unspecified: Secondary | ICD-10-CM

## 2020-07-16 ENCOUNTER — Ambulatory Visit (INDEPENDENT_AMBULATORY_CARE_PROVIDER_SITE_OTHER): Payer: BC Managed Care – PPO | Admitting: Psychology

## 2020-07-16 DIAGNOSIS — F4310 Post-traumatic stress disorder, unspecified: Secondary | ICD-10-CM

## 2020-07-23 ENCOUNTER — Ambulatory Visit (INDEPENDENT_AMBULATORY_CARE_PROVIDER_SITE_OTHER): Payer: BC Managed Care – PPO | Admitting: Psychology

## 2020-07-23 DIAGNOSIS — F4310 Post-traumatic stress disorder, unspecified: Secondary | ICD-10-CM | POA: Diagnosis not present

## 2020-07-30 ENCOUNTER — Ambulatory Visit (INDEPENDENT_AMBULATORY_CARE_PROVIDER_SITE_OTHER): Payer: BC Managed Care – PPO | Admitting: Psychology

## 2020-07-30 DIAGNOSIS — F4310 Post-traumatic stress disorder, unspecified: Secondary | ICD-10-CM | POA: Diagnosis not present

## 2020-08-06 ENCOUNTER — Ambulatory Visit (INDEPENDENT_AMBULATORY_CARE_PROVIDER_SITE_OTHER): Payer: BC Managed Care – PPO | Admitting: Psychology

## 2020-08-06 DIAGNOSIS — F4310 Post-traumatic stress disorder, unspecified: Secondary | ICD-10-CM

## 2020-08-13 ENCOUNTER — Ambulatory Visit (INDEPENDENT_AMBULATORY_CARE_PROVIDER_SITE_OTHER): Payer: BC Managed Care – PPO | Admitting: Psychology

## 2020-08-13 DIAGNOSIS — F4310 Post-traumatic stress disorder, unspecified: Secondary | ICD-10-CM | POA: Diagnosis not present

## 2020-08-20 ENCOUNTER — Ambulatory Visit (INDEPENDENT_AMBULATORY_CARE_PROVIDER_SITE_OTHER): Payer: BC Managed Care – PPO | Admitting: Psychology

## 2020-08-20 DIAGNOSIS — F4310 Post-traumatic stress disorder, unspecified: Secondary | ICD-10-CM | POA: Diagnosis not present

## 2020-08-27 ENCOUNTER — Ambulatory Visit (INDEPENDENT_AMBULATORY_CARE_PROVIDER_SITE_OTHER): Payer: BC Managed Care – PPO | Admitting: Psychology

## 2020-08-27 DIAGNOSIS — F4310 Post-traumatic stress disorder, unspecified: Secondary | ICD-10-CM | POA: Diagnosis not present

## 2020-09-03 ENCOUNTER — Ambulatory Visit (INDEPENDENT_AMBULATORY_CARE_PROVIDER_SITE_OTHER): Payer: BC Managed Care – PPO | Admitting: Psychology

## 2020-09-03 DIAGNOSIS — F4310 Post-traumatic stress disorder, unspecified: Secondary | ICD-10-CM

## 2020-09-10 ENCOUNTER — Ambulatory Visit (INDEPENDENT_AMBULATORY_CARE_PROVIDER_SITE_OTHER): Payer: BC Managed Care – PPO | Admitting: Psychology

## 2020-09-10 DIAGNOSIS — F4310 Post-traumatic stress disorder, unspecified: Secondary | ICD-10-CM | POA: Diagnosis not present

## 2020-09-24 ENCOUNTER — Ambulatory Visit (INDEPENDENT_AMBULATORY_CARE_PROVIDER_SITE_OTHER): Payer: BC Managed Care – PPO | Admitting: Psychology

## 2020-09-24 DIAGNOSIS — F4310 Post-traumatic stress disorder, unspecified: Secondary | ICD-10-CM

## 2022-02-02 ENCOUNTER — Encounter (HOSPITAL_BASED_OUTPATIENT_CLINIC_OR_DEPARTMENT_OTHER): Payer: Self-pay | Admitting: Emergency Medicine

## 2022-02-02 ENCOUNTER — Emergency Department (HOSPITAL_BASED_OUTPATIENT_CLINIC_OR_DEPARTMENT_OTHER)
Admission: EM | Admit: 2022-02-02 | Discharge: 2022-02-02 | Disposition: A | Discharge status: Home / Self Care | Payer: BC Managed Care – PPO | Attending: Emergency Medicine | Admitting: Emergency Medicine

## 2022-02-02 ENCOUNTER — Emergency Department (HOSPITAL_BASED_OUTPATIENT_CLINIC_OR_DEPARTMENT_OTHER): Payer: BC Managed Care – PPO

## 2022-02-02 ENCOUNTER — Other Ambulatory Visit: Payer: Self-pay

## 2022-02-02 DIAGNOSIS — Z1152 Encounter for screening for COVID-19: Secondary | ICD-10-CM | POA: Insufficient documentation

## 2022-02-02 DIAGNOSIS — J189 Pneumonia, unspecified organism: Secondary | ICD-10-CM | POA: Diagnosis not present

## 2022-02-02 DIAGNOSIS — R059 Cough, unspecified: Secondary | ICD-10-CM | POA: Diagnosis present

## 2022-02-02 LAB — RESP PANEL BY RT-PCR (RSV, FLU A&B, COVID)  RVPGX2
Influenza A by PCR: NEGATIVE
Influenza B by PCR: NEGATIVE
Resp Syncytial Virus by PCR: NEGATIVE
SARS Coronavirus 2 by RT PCR: NEGATIVE

## 2022-02-02 MED ORDER — ACETAMINOPHEN 500 MG PO TABS
1000.0000 mg | ORAL_TABLET | Freq: Once | ORAL | Status: AC
  Administered 2022-02-02: 1000 mg via ORAL
  Filled 2022-02-02: qty 2

## 2022-02-02 MED ORDER — DOXYCYCLINE HYCLATE 100 MG PO CAPS: 100.0000 mg | ORAL_CAPSULE | Freq: Two times a day (BID) | ORAL | 0 refills | Status: AC

## 2022-02-02 MED ORDER — DOXYCYCLINE HYCLATE 100 MG PO TABS
100.0000 mg | ORAL_TABLET | Freq: Once | ORAL | Status: AC
  Administered 2022-02-02: 100 mg via ORAL
  Filled 2022-02-02: qty 1

## 2022-02-02 MED ORDER — DEXAMETHASONE 4 MG PO TABS
10.0000 mg | ORAL_TABLET | Freq: Once | ORAL | Status: AC
  Administered 2022-02-02: 10 mg via ORAL
  Filled 2022-02-02: qty 3

## 2022-02-02 NOTE — ED Provider Notes (Signed)
[facility] EMERGENCY DEPARTMENT Provider Note   CSN: [id number] Arrival date & time: 02/02/22  1847     History  No chief complaint on file.   Pamela [name] is a 51 y.o. female.  Patient here with continued cough and fever and chills despite being diagnosed with flu 9 days ago.  No major medical problems.  Nothing makes it worse or better.  She still has a cough.  Denies any weakness fatigue nausea, vomiting, diarrhea.  The history is provided by the patient.       Home Medications Prior to Admission medications   Medication Sig Start Date End Date Taking? Authorizing Provider  doxycycline (VIBRAMYCIN) 100 MG capsule Take 1 capsule (100 mg total) by mouth 2 (two) times daily. 02/02/22  Yes Emonte Dieujuste, DO  albuterol (PROVENTIL HFA;VENTOLIN HFA) 108 (90 Base) MCG/ACT inhaler Inhale 2 puffs into the lungs every 6 (six) hours as needed for wheezing or shortness of breath. 03/17/18   Mike Gip, FNP  cetirizine (ZYRTEC) 10 MG tablet Take 1 tablet (10 mg total) by mouth daily. 10/10/18   Mike Gip, FNP  Cholecalciferol (VITAMIN D3) 5000 UNITS TABS Take 5,000 Units by mouth daily.     [provider]  diltiazem (CARDIZEM CD) 120 MG 24 hr capsule Take 1 capsule (120 mg total) by mouth daily. 11/03/18   Quentin Angst, MD  fluticasone (FLONASE) 50 MCG/ACT nasal spray Place 2 sprays into both nostrils daily. 09/17/17   Mike Gip, FNP  metoprolol succinate (TOPROL-XL) 25 MG 24 hr tablet Take 1 tablet (25 mg total) by mouth daily. 11/28/18   Quentin Angst, MD  Omega-3 Fatty Acids (FISH OIL) 1200 MG CPDR Take 1,200 mg by mouth at bedtime.     [provider]  rizatriptan (MAXALT-MLT) 10 MG disintegrating tablet Take 1 tablet (10 mg total) by mouth 3 (three) times daily as needed for migraine. May repeat in 2 hours if needed 04/27/18   Mike Gip, FNP  valACYclovir (VALTREX) 500 MG tablet Take 1 tablet by mouth twice daily 08/29/18    Mike Gip, FNP      Allergies    Amoxicillin, Bee venom, Imitrex [sumatriptan], Tramadol, and Vicodin [hydrocodone-acetaminophen]    Review of Systems   Review of Systems  Physical Exam Updated Vital Signs BP 124/70 (BP Location: Right Arm)   Pulse 66   Temp 100.2 F (37.9 C) (Oral)   Resp (!) 22   Ht 5\' 3"  (1.6 m)   Wt 86.2 kg   LMP 08/07/2010 (Approximate) Comment: Patient declined pregnancy test  SpO2 97%   BMI 33.66 kg/m  Physical Exam Vitals and nursing note reviewed.  Constitutional:      General: She is not in acute distress.    Appearance: She is well-developed.  HENT:     Head: Normocephalic and atraumatic.     Nose: Nose normal.     Mouth/Throat:     Mouth: Mucous membranes are moist.  Eyes:     Extraocular Movements: Extraocular movements intact.     Conjunctiva/sclera: Conjunctivae normal.     Pupils: Pupils are equal, round, and reactive to light.  Cardiovascular:     Rate and Rhythm: Normal rate and regular rhythm.     Pulses: Normal pulses.     Heart sounds: Normal heart sounds. No murmur heard. Pulmonary:     Effort: Pulmonary effort is normal. No respiratory distress.     Breath sounds: Normal breath sounds.  Abdominal:  Palpations: Abdomen is soft.     Tenderness: There is no abdominal tenderness.  Musculoskeletal:        General: No swelling.     Cervical back: Neck supple.  Skin:    General: Skin is warm and dry.     Capillary Refill: Capillary refill takes less than 2 seconds.  Neurological:     General: No focal deficit present.     Mental Status: She is alert.  Psychiatric:        Mood and Affect: Mood normal.     ED Results / Procedures / Treatments   Labs (all labs ordered are listed, but only abnormal results are displayed) Labs Reviewed  RESP PANEL BY RT-PCR (RSV, FLU A&B, COVID)  [id number]    EKG None  Radiology DG Chest Portable 1 View  Result Date: 02/02/2022 CLINICAL DATA:  Cough, short of breath EXAM:  PORTABLE CHEST 1 VIEW COMPARISON:  03/17/2018 FINDINGS: Single frontal view of the chest demonstrates a stable cardiac silhouette. There is a rounded area of consolidation within the right apex, with larger areas of airspace disease within the left mid lung and right lower lung zones. No evidence of cavitation. Findings are most consistent with multifocal bronchopneumonia, though close follow-up is recommended to ensure resolution and exclude underlying neoplasm. No effusion or pneumothorax. There are no acute bony abnormalities. IMPRESSION: 1. Multifocal bilateral lung consolidation, most consistent with bilateral pneumonia. Followup PA and lateral chest X-ray is recommended in 3-4 weeks following trial of antibiotic therapy to ensure resolution and exclude underlying malignancy. Electronically Signed   By: Sharlet Salina M.D.   On: 02/02/2022 20:59    Procedures Procedures    Medications Ordered in ED Medications  dexamethasone (DECADRON) tablet 10 mg (has no administration in time range)  doxycycline (VIBRA-TABS) tablet 100 mg (has no administration in time range)  acetaminophen (TYLENOL) tablet 1,000 mg (1,000 mg Oral Given 02/02/22 1957)    ED Course/ Medical Decision Making/ A&P                           Medical Decision Making Amount and/or Complexity of Data Reviewed Radiology: ordered.  Risk OTC drugs. Prescription drug management.   Pamela [name] is here with cough and congestion after being diagnosed with flu 9 days ago.  Still having some fevers.  Still has a bad cough with some sputum production.  COVID and flu test negative today.  However chest x-ray per my review and interpretation shows pneumonia.  Overall suspect that this is a secondary infection from her flu.  She has no signs of respiratory distress.  I am not concerned about sepsis.  She is very well-appearing.  Will start her on antibiotics and have her follow-up with primary care doctor.  She understands return  precautions.  This chart was dictated using voice recognition software.  Despite best efforts to proofread,  errors can occur which can change the documentation meaning.         Final Clinical Impression(s) / ED Diagnoses Final diagnoses:  Community acquired pneumonia, unspecified laterality    Rx / DC Orders ED Discharge Orders          Ordered    doxycycline (VIBRAMYCIN) 100 MG capsule  2 times daily        02/02/22 2104              Virgina Norfolk, DO 02/02/22 2106

## 2022-02-02 NOTE — ED Triage Notes (Signed)
Feeling bad Since 16th PCP was flu b + has not gotten better here for reevaluation.

## 2022-02-02 NOTE — Discharge Instructions (Signed)
Chest x-ray does show pneumonia.  This is likely secondary to your flu.  Take antibiotics as prescribed.  Take your next dose tomorrow morning.  If you develop any worsening symptoms, severe shortness of breath, extreme fatigue please return for evaluation.  Continue Tylenol and ibuprofen for any further fever.  Please follow-up with your primary care doctor you should get a repeat x-ray to ensure resolution of your pneumonia.

## 2022-10-01 ENCOUNTER — Telehealth: Payer: Self-pay

## 2022-10-01 NOTE — Transitions of Care (Post Inpatient/ED Visit) (Signed)
   10/01/2022  Name: Pamela [name] MRN: [id number] DOB: 1970/05/29  Not our pt she advised that she had already been seen by her PCP.   Renelda Loma RMA

## 2023-08-27 ENCOUNTER — Encounter: Payer: Self-pay | Admitting: Advanced Practice Midwife
